# Patient Record
Sex: Female | Born: 1945
Health system: Southern US, Community
[De-identification: ages and names within clinical notes are randomized; demographics above are authoritative.]

## PROBLEM LIST (undated history)

## (undated) DIAGNOSIS — J40 Bronchitis, not specified as acute or chronic: Secondary | ICD-10-CM

## (undated) DIAGNOSIS — N816 Rectocele: Secondary | ICD-10-CM

## (undated) DIAGNOSIS — R112 Nausea with vomiting, unspecified: Secondary | ICD-10-CM

## (undated) DIAGNOSIS — R51 Headache: Secondary | ICD-10-CM

## (undated) DIAGNOSIS — M810 Age-related osteoporosis without current pathological fracture: Secondary | ICD-10-CM

## (undated) DIAGNOSIS — J449 Chronic obstructive pulmonary disease, unspecified: Secondary | ICD-10-CM

## (undated) DIAGNOSIS — J301 Allergic rhinitis due to pollen: Secondary | ICD-10-CM

## (undated) DIAGNOSIS — IMO0002 Reserved for concepts with insufficient information to code with codable children: Secondary | ICD-10-CM

## (undated) DIAGNOSIS — E669 Obesity, unspecified: Secondary | ICD-10-CM

## (undated) DIAGNOSIS — J1282 Pneumonia due to coronavirus disease 2019: Secondary | ICD-10-CM

## (undated) DIAGNOSIS — L719 Rosacea, unspecified: Secondary | ICD-10-CM

## (undated) DIAGNOSIS — Z9889 Other specified postprocedural states: Secondary | ICD-10-CM

## (undated) DIAGNOSIS — U071 COVID-19: Secondary | ICD-10-CM

## (undated) HISTORY — DX: Headache: R51

## (undated) HISTORY — PX: CHOLECYSTECTOMY: SHX55

## (undated) HISTORY — DX: Chronic obstructive pulmonary disease, unspecified: J44.9

## (undated) HISTORY — DX: Pneumonia due to coronavirus disease 2019: J12.82

## (undated) HISTORY — PX: TUBAL LIGATION: SHX77

## (undated) HISTORY — DX: Rectocele: N81.6

## (undated) HISTORY — DX: Reserved for concepts with insufficient information to code with codable children: IMO0002

## (undated) HISTORY — DX: COVID-19: U07.1

## (undated) HISTORY — DX: Allergic rhinitis due to pollen: J30.1

## (undated) HISTORY — DX: Age-related osteoporosis without current pathological fracture: M81.0

## (undated) HISTORY — DX: Bronchitis, not specified as acute or chronic: J40

## (undated) HISTORY — DX: Rosacea, unspecified: L71.9

## (undated) HISTORY — DX: Nausea with vomiting, unspecified: R11.2

## (undated) HISTORY — DX: Obesity, unspecified: E66.9

---

## 2001-05-07 ENCOUNTER — Encounter: Payer: Self-pay | Admitting: Obstetrics and Gynecology

## 2001-05-07 ENCOUNTER — Ambulatory Visit (HOSPITAL_COMMUNITY): Admission: RE | Admit: 2001-05-07 | Discharge: 2001-05-07 | Payer: Self-pay | Admitting: Obstetrics and Gynecology

## 2001-05-07 ENCOUNTER — Other Ambulatory Visit: Admission: RE | Admit: 2001-05-07 | Discharge: 2001-05-07 | Payer: Self-pay | Admitting: Obstetrics and Gynecology

## 2001-12-13 ENCOUNTER — Ambulatory Visit (HOSPITAL_COMMUNITY): Admission: RE | Admit: 2001-12-13 | Discharge: 2001-12-13 | Payer: Self-pay | Admitting: Pulmonary Disease

## 2001-12-14 ENCOUNTER — Ambulatory Visit (HOSPITAL_COMMUNITY): Admission: RE | Admit: 2001-12-14 | Discharge: 2001-12-14 | Payer: Self-pay | Admitting: General Surgery

## 2001-12-14 ENCOUNTER — Encounter: Payer: Self-pay | Admitting: General Surgery

## 2002-05-10 ENCOUNTER — Ambulatory Visit (HOSPITAL_COMMUNITY): Admission: RE | Admit: 2002-05-10 | Discharge: 2002-05-10 | Payer: Self-pay | Admitting: Obstetrics and Gynecology

## 2002-05-10 ENCOUNTER — Encounter: Payer: Self-pay | Admitting: Obstetrics and Gynecology

## 2003-01-16 ENCOUNTER — Other Ambulatory Visit: Admission: RE | Admit: 2003-01-16 | Discharge: 2003-01-16 | Payer: Self-pay | Admitting: Dental General Practice

## 2003-05-30 ENCOUNTER — Ambulatory Visit (HOSPITAL_COMMUNITY): Admission: RE | Admit: 2003-05-30 | Discharge: 2003-05-30 | Payer: Self-pay | Admitting: Obstetrics and Gynecology

## 2004-06-07 ENCOUNTER — Ambulatory Visit (HOSPITAL_COMMUNITY): Admission: RE | Admit: 2004-06-07 | Discharge: 2004-06-07 | Payer: Self-pay | Admitting: Obstetrics and Gynecology

## 2005-06-24 ENCOUNTER — Ambulatory Visit (HOSPITAL_COMMUNITY): Admission: RE | Admit: 2005-06-24 | Discharge: 2005-06-24 | Payer: Self-pay | Admitting: Obstetrics and Gynecology

## 2006-06-29 ENCOUNTER — Ambulatory Visit (HOSPITAL_COMMUNITY): Admission: RE | Admit: 2006-06-29 | Discharge: 2006-06-29 | Payer: Self-pay | Admitting: Obstetrics and Gynecology

## 2007-07-06 ENCOUNTER — Other Ambulatory Visit: Admission: RE | Admit: 2007-07-06 | Discharge: 2007-07-06 | Payer: Self-pay | Admitting: Obstetrics and Gynecology

## 2007-07-06 ENCOUNTER — Ambulatory Visit (HOSPITAL_COMMUNITY): Admission: RE | Admit: 2007-07-06 | Discharge: 2007-07-06 | Payer: Self-pay | Admitting: Obstetrics and Gynecology

## 2008-08-01 ENCOUNTER — Ambulatory Visit (HOSPITAL_COMMUNITY): Admission: RE | Admit: 2008-08-01 | Discharge: 2008-08-01 | Payer: Self-pay | Admitting: Obstetrics and Gynecology

## 2008-08-29 ENCOUNTER — Other Ambulatory Visit: Admission: RE | Admit: 2008-08-29 | Discharge: 2008-08-29 | Payer: Self-pay | Admitting: Obstetrics and Gynecology

## 2009-08-14 ENCOUNTER — Ambulatory Visit (HOSPITAL_COMMUNITY): Admission: RE | Admit: 2009-08-14 | Discharge: 2009-08-14 | Payer: Self-pay | Admitting: Pulmonary Disease

## 2009-09-11 ENCOUNTER — Other Ambulatory Visit: Admission: RE | Admit: 2009-09-11 | Discharge: 2009-09-11 | Payer: Self-pay | Admitting: Obstetrics and Gynecology

## 2010-07-23 ENCOUNTER — Other Ambulatory Visit: Payer: Self-pay | Admitting: Obstetrics and Gynecology

## 2010-07-23 DIAGNOSIS — Z1239 Encounter for other screening for malignant neoplasm of breast: Secondary | ICD-10-CM

## 2010-07-24 ENCOUNTER — Encounter: Payer: Self-pay | Admitting: Obstetrics and Gynecology

## 2010-08-20 ENCOUNTER — Ambulatory Visit (HOSPITAL_COMMUNITY): Payer: BC Managed Care – PPO

## 2010-08-20 ENCOUNTER — Ambulatory Visit (HOSPITAL_COMMUNITY)
Admission: RE | Admit: 2010-08-20 | Discharge: 2010-08-20 | Disposition: A | Discharge status: Home / Self Care | Payer: BC Managed Care – PPO | Source: Ambulatory Visit | Attending: Obstetrics and Gynecology | Admitting: Obstetrics and Gynecology

## 2010-08-20 DIAGNOSIS — Z1231 Encounter for screening mammogram for malignant neoplasm of breast: Secondary | ICD-10-CM | POA: Insufficient documentation

## 2010-08-20 DIAGNOSIS — Z1239 Encounter for other screening for malignant neoplasm of breast: Secondary | ICD-10-CM

## 2010-09-17 ENCOUNTER — Other Ambulatory Visit: Payer: Self-pay | Admitting: Adult Health

## 2010-09-17 ENCOUNTER — Other Ambulatory Visit (HOSPITAL_COMMUNITY)
Admission: RE | Admit: 2010-09-17 | Discharge: 2010-09-17 | Disposition: A | Discharge status: Home / Self Care | Payer: BC Managed Care – PPO | Source: Ambulatory Visit | Attending: Obstetrics and Gynecology | Admitting: Obstetrics and Gynecology

## 2010-09-17 DIAGNOSIS — Z01419 Encounter for gynecological examination (general) (routine) without abnormal findings: Secondary | ICD-10-CM | POA: Insufficient documentation

## 2010-11-19 NOTE — Op Note (Signed)
[facility]  Patient:    Jessica Lewis, Jessica Lewis Visit Number: [id number] MRN: [id number]          Service Type: OUT Location: RAD Attending Physician:  Fredirick Maudlin Dictated by:   Franky Macho, M.D. Proc. Date: 12/14/01 Admit Date:  12/13/2001 Discharge Date: 12/13/2001   CC:         Kari Baars, M.D.   Operative Report  PREOPERATIVE DIAGNOSES:  Cholecystitis, cholelithiasis.  POSTOPERATIVE DIAGNOSES:  Cholecystitis, cholelithiasis.  PROCEDURE:  Laparoscopic cholecystectomy.  SURGEON:  Franky Macho, M.D.  ASSISTANT:  Arna Snipe, M.D.  ANESTHESIA:  General endotracheal.  INDICATIONS FOR PROCEDURE:  The patient is a 65 year old white female who presents with acute cholecystitis secondary to cholelithiasis. The risks and benefits of the procedure including bleeding, infection, hepatobiliary injury, and the possibility of an open procedure were fully explained to the patient, who gave informed consent.  DESCRIPTION OF PROCEDURE:  The patient was placed in the supine position. After induction of general endotracheal anesthesia, the abdomen was prepped and draped using the usual sterile technique with Betadine.  A supraumbilical incision was made down to the fascia. A Veress needle was introduced into the abdominal cavity and confirmation of placement was done using the saline drop test. The abdomen was then insufflated to 16 mmHg pressure. An 11 mm trocar was introduced into the abdominal cavity under direct visualization without difficulty. The patient was placed in reverse Trendelenburg position and an additional 11 mm trocar was placed in the epigastric region and 5 mm trocars were placed in the right upper quadrant and right flank regions.  The liver was inspected and noted to be within normal limits. The gallbladder was noted to be distended, swollen, and edematous. Needle decompression of the gallbladder was performed. No purulent  drainage was noted. The gallbladder was retracted superior and laterally. The dissection was begun around the infundibulum of the gallbladder. The cystic duct was first identified. Its juncture to the infundibulum fully identified and the clips were placed proximally and distally on the cystic duct and the cystic duct was divided. This was likewise done in the cystic artery. The gallbladder was then freed away from the gallbladder fossa using Bovie electrocautery. The gallbladder was delivered through the epigastric trocar site using an endocatch bag. The gallbladder fossa was inspected and no abnormal bleeding or bile leakage was noted. Surgicel was placed in the gallbladder fossa. The subhepatic space as well as the right hepatic gutter were irrigated with normal saline. All fluid and air were then evacuated from the abdominal cavity prior to removal of the trocars.  All wounds were irrigated with normal saline. All wounds were injected with 0.5% Sensorcaine. The supraumbilical fascia was reapproximated using an #0 Vicryl interrupted suture. While closing the epigastric fascia with an #0 Vicryl suture, a portion of the needle broke off. This was retrieved in the soft tissue using C-arm fluoroscopy. The whole needle was retrieved. There was no remnant metallic body within the wound. All skin incisions were closed using staples. Betadine ointment and dry sterile dressings were applied.  All tape and needle counts were correct at the end of the procedure. The patient was extubated in the operating room and went back to the recovery room awake in stable condition.  SPECIMEN:  Gallbladder with stones.  ESTIMATED BLOOD LOSS:  Minimal. Dictated by:   Franky Macho, M.D. Attending Physician:  Fredirick Maudlin DD:  12/14/01 TD:  12/16/01 Job: [id number]

## 2011-01-20 ENCOUNTER — Encounter (INDEPENDENT_AMBULATORY_CARE_PROVIDER_SITE_OTHER): Payer: BC Managed Care – PPO | Admitting: Internal Medicine

## 2011-02-09 ENCOUNTER — Encounter (HOSPITAL_COMMUNITY): Payer: Self-pay | Admitting: *Deleted

## 2011-02-09 ENCOUNTER — Ambulatory Visit (HOSPITAL_COMMUNITY)
Admission: RE | Admit: 2011-02-09 | Discharge: 2011-02-09 | Disposition: A | Discharge status: Home / Self Care | Payer: BC Managed Care – PPO | Source: Ambulatory Visit | Attending: Internal Medicine | Admitting: Internal Medicine

## 2011-02-09 ENCOUNTER — Encounter (INDEPENDENT_AMBULATORY_CARE_PROVIDER_SITE_OTHER): Payer: BC Managed Care – PPO | Admitting: Internal Medicine

## 2011-02-09 ENCOUNTER — Encounter (HOSPITAL_COMMUNITY)
Admission: RE | Disposition: A | Discharge status: Home / Self Care | Payer: Self-pay | Source: Ambulatory Visit | Attending: Internal Medicine

## 2011-02-09 DIAGNOSIS — K5730 Diverticulosis of large intestine without perforation or abscess without bleeding: Secondary | ICD-10-CM

## 2011-02-09 DIAGNOSIS — Z1211 Encounter for screening for malignant neoplasm of colon: Secondary | ICD-10-CM | POA: Insufficient documentation

## 2011-02-09 DIAGNOSIS — K644 Residual hemorrhoidal skin tags: Secondary | ICD-10-CM | POA: Insufficient documentation

## 2011-02-09 HISTORY — PX: COLONOSCOPY: SHX5424

## 2011-02-09 HISTORY — DX: Age-related osteoporosis without current pathological fracture: M81.0

## 2011-02-09 SURGERY — COLONOSCOPY
Anesthesia: Moderate Sedation

## 2011-02-09 MED ORDER — STERILE WATER FOR IRRIGATION IR SOLN
Status: DC | PRN
  Administered 2011-02-09: 13:00:00

## 2011-02-09 MED ORDER — MEPERIDINE HCL 50 MG/ML IJ SOLN
INTRAMUSCULAR | Status: DC | PRN
  Administered 2011-02-09 (×2): 25 mg via INTRAVENOUS

## 2011-02-09 MED ORDER — MIDAZOLAM HCL 5 MG/5ML IJ SOLN
INTRAMUSCULAR | Status: AC
  Filled 2011-02-09: qty 10

## 2011-02-09 MED ORDER — MIDAZOLAM HCL 5 MG/5ML IJ SOLN
INTRAMUSCULAR | Status: DC | PRN
  Administered 2011-02-09 (×3): 2 mg via INTRAVENOUS

## 2011-02-09 MED ORDER — SODIUM CHLORIDE 0.45 % IV SOLN
Freq: Once | INTRAVENOUS | Status: AC
  Administered 2011-02-09: 12:00:00 via INTRAVENOUS

## 2011-02-09 MED ORDER — MEPERIDINE HCL 50 MG/ML IJ SOLN
INTRAMUSCULAR | Status: AC
  Filled 2011-02-09: qty 1

## 2011-02-09 NOTE — H&P (Signed)
Jessica Lewis is an 65 y.o. female.   Chief Complaint: for screening colonoscopy. HPI: She is 65 year old female who is in for average risk screening colonoscopy; her last exam was 2000; she denies. abdominal pain change in her bowel habits melena or rectal bleeding Family history is negative for colorectal carcinoma  Past Medical History  Diagnosis Date  . Osteoporosis, unspecified     Past Surgical History  Procedure Date  . Cholecystectomy     History reviewed. No pertinent family history. Social History:  reports that she has never smoked. She does not have any smokeless tobacco history on file. She reports that she does not drink alcohol or use illicit drugs.  Allergies:  Allergies  Allergen Reactions  . Codeine Nausea And Vomiting  . Tetracyclines & Related Rash    Medications Prior to Admission  Medication Dose Route Frequency Provider Last Rate Last Dose  . 0.45 % sodium chloride infusion   Intravenous Once Malissa Hippo, MD 20 mL/hr at 02/09/11 1223    . meperidine (DEMEROL) 50 MG/ML injection           . midazolam (VERSED) 5 MG/5ML injection            Medications Prior to Admission  Medication Sig Dispense Refill  . calcium carbonate 200 MG capsule Take 1,000 mg by mouth 2 (two) times daily with a meal.        . multivitamin (THERAGRAN) per tablet Take 1 tablet by mouth daily.          No results found for this or any previous visit (from the past 48 hour(s)). No results found.  Review of Systems  Constitutional: Positive for weight loss (15 lb voluntary weight loss in 3 months.).  Gastrointestinal: Negative for abdominal pain, diarrhea, constipation, blood in stool and melena.    Blood pressure 166/73, pulse 110, temperature 98 F (36.7 C), temperature source Oral, resp. rate 18, height 5\' 4"  (1.626 m), weight 214 lb (97.07 kg), SpO2 94.00%. Physical Exam  Constitutional: She appears well-developed and well-nourished.  HENT:  Mouth/Throat: Oropharynx is  clear and moist.  Eyes: Conjunctivae are normal. No scleral icterus.  Neck: No thyromegaly present.  Cardiovascular: Normal rate, regular rhythm and normal heart sounds.   No murmur heard. Respiratory: Breath sounds normal.  GI: Soft. She exhibits no distension and no mass. There is no tenderness.  Musculoskeletal: She exhibits no edema.  Lymphadenopathy:    She has no cervical adenopathy.  Neurological: She is alert.  Skin: Skin is warm and dry.     Assessment/Plan Average risk screening colonoscopy. Procedure and risks  reviewed with him and she is agreeable,  Davide Risdon U 02/09/2011, 1:04 PM

## 2011-02-09 NOTE — Op Note (Signed)
COLONOSCOPY PROCEDURE REPORT  PATIENT:  Jessica Lewis  MR#:  [id number] Birthdate:  April 15, 1946, 65 y.o., female Endoscopist:  Dr. Malissa Hippo, MD Referred By:  Dr. Fredirick Maudlin. Procedure Date: 02/09/2011  Procedure:   Colonoscopy  Indications:  Average risk screening colonoscopy.  Informed Consent:  Procedure and risks were reviewed with the patient and informed consent was obtained Medications:  Demerol 50 mg IV Versed 6 mg IV  Description of procedure:  After a digital rectal exam was performed, that colonoscope was advanced from the anus through the rectum and colon to the area of the cecum, ileocecal valve and appendiceal orifice. The cecum was deeply intubated. These structures were well-seen and photographed for the record. From the level of the cecum and ileocecal valve, the scope was slowly and cautiously withdrawn. The mucosal surfaces were carefully surveyed utilizing scope tip to flexion to facilitate fold flattening as needed. The scope was pulled down into the rectum where a thorough exam including retroflexion was performed. Terminal ileum was also examined.  Findings:   Prep excellent. Moderate number of diverticula at the sigmoid colon. Small external hemorrhoids.   Therapeutic/Diagnostic Maneuvers Performed:  None  Complications: None  Cecal Withdrawal Time:  10 minutes  Impression:  Normal terminal ileum. Moderate  sgmoid diverticulosis. Small external hemorrhoids.  Recommendations:  Resume usual medications. High fiber diet. Next screening exam in 10 years.  [name] U  02/09/2011 1:34 PM  CC: Dr. Fredirick Maudlin, MD

## 2011-02-16 ENCOUNTER — Encounter (HOSPITAL_COMMUNITY): Payer: Self-pay | Admitting: Internal Medicine

## 2011-03-02 ENCOUNTER — Encounter (INDEPENDENT_AMBULATORY_CARE_PROVIDER_SITE_OTHER): Payer: BC Managed Care – PPO | Admitting: Internal Medicine

## 2011-07-11 ENCOUNTER — Other Ambulatory Visit: Payer: Self-pay | Admitting: Obstetrics and Gynecology

## 2011-07-11 DIAGNOSIS — Z139 Encounter for screening, unspecified: Secondary | ICD-10-CM

## 2011-07-26 ENCOUNTER — Ambulatory Visit (HOSPITAL_COMMUNITY): Payer: BC Managed Care – PPO

## 2011-08-26 ENCOUNTER — Ambulatory Visit (HOSPITAL_COMMUNITY): Payer: BC Managed Care – PPO

## 2011-09-02 ENCOUNTER — Ambulatory Visit (HOSPITAL_COMMUNITY)
Admission: RE | Admit: 2011-09-02 | Discharge: 2011-09-02 | Disposition: A | Discharge status: Home / Self Care | Payer: BC Managed Care – PPO | Source: Ambulatory Visit | Attending: Obstetrics and Gynecology | Admitting: Obstetrics and Gynecology

## 2011-09-02 DIAGNOSIS — Z1231 Encounter for screening mammogram for malignant neoplasm of breast: Secondary | ICD-10-CM | POA: Insufficient documentation

## 2011-09-02 DIAGNOSIS — Z139 Encounter for screening, unspecified: Secondary | ICD-10-CM

## 2012-08-21 ENCOUNTER — Other Ambulatory Visit (HOSPITAL_COMMUNITY): Payer: Self-pay | Admitting: Adult Health

## 2012-08-21 DIAGNOSIS — Z139 Encounter for screening, unspecified: Secondary | ICD-10-CM

## 2012-09-04 ENCOUNTER — Ambulatory Visit (HOSPITAL_COMMUNITY): Payer: BC Managed Care – PPO

## 2012-09-06 ENCOUNTER — Ambulatory Visit (HOSPITAL_COMMUNITY)
Admission: RE | Admit: 2012-09-06 | Discharge: 2012-09-06 | Disposition: A | Discharge status: Home / Self Care | Payer: Medicare Other | Source: Ambulatory Visit | Attending: Adult Health | Admitting: Adult Health

## 2012-09-06 DIAGNOSIS — Z139 Encounter for screening, unspecified: Secondary | ICD-10-CM

## 2012-09-06 DIAGNOSIS — Z1231 Encounter for screening mammogram for malignant neoplasm of breast: Secondary | ICD-10-CM | POA: Insufficient documentation

## 2012-10-30 ENCOUNTER — Encounter (INDEPENDENT_AMBULATORY_CARE_PROVIDER_SITE_OTHER): Payer: Self-pay

## 2012-11-15 ENCOUNTER — Encounter: Payer: Self-pay | Admitting: *Deleted

## 2012-11-15 DIAGNOSIS — G43909 Migraine, unspecified, not intractable, without status migrainosus: Secondary | ICD-10-CM

## 2012-11-16 ENCOUNTER — Ambulatory Visit (INDEPENDENT_AMBULATORY_CARE_PROVIDER_SITE_OTHER): Payer: Medicare Other | Admitting: Adult Health

## 2012-11-16 ENCOUNTER — Encounter: Payer: Self-pay | Admitting: Adult Health

## 2012-11-16 VITALS — BP 172/84 | Ht 63.0 in | Wt 226.0 lb

## 2012-11-16 DIAGNOSIS — N816 Rectocele: Secondary | ICD-10-CM

## 2012-11-16 DIAGNOSIS — M8580 Other specified disorders of bone density and structure, unspecified site: Secondary | ICD-10-CM

## 2012-11-16 DIAGNOSIS — Z01419 Encounter for gynecological examination (general) (routine) without abnormal findings: Secondary | ICD-10-CM

## 2012-11-16 DIAGNOSIS — Z1212 Encounter for screening for malignant neoplasm of rectum: Secondary | ICD-10-CM

## 2012-11-16 HISTORY — DX: Rectocele: N81.6

## 2012-11-16 LAB — HEMOCCULT GUIAC POC 1CARD (OFFICE): Fecal Occult Blood, POC: NEGATIVE

## 2012-11-16 NOTE — Patient Instructions (Addendum)
BP 172/84 recheck 150/80 get daughter to check Physical and pap  1 year Sign up for my chart

## 2012-11-16 NOTE — Progress Notes (Signed)
Patient ID: Jessica Lewis, female   DOB: [birth year], 67 y.o.   MRN: [id number] History of Present Illness: Jessica Lewis is a 67 year old white female in for a physical. She is retired now and loves it.   Current Medications, Allergies, Past Medical History, Past Surgical History, Family History and Social History were reviewed in [employer] record.    Review of Systems: Patient denies any headaches, blurred vision, shortness of breath, chest pain, abdominal pain, problems with bowel movements, urination. She is not having sex, no mood changes, except she was down at Mothers Day she misses her MOM. She is having pain in her feet and going to see Dr. Nolen Mu.  Physical Exam:Blood pressure 172/84, height 5\' 3"  (1.6 m), weight 226 lb (102.513 kg). General:  Well developed, well nourished, no acute distress Skin:  Warm and dry Neck:  Midline trachea, normal thyroid, no carotid bruits heard Lungs; Clear to auscultation bilaterally Breast:  No dominant palpable mass, retraction, or nipple discharge Cardiovascular: Regular rate and rhythm Abdomen:  Soft, non tender, no hepatosplenomegaly Pelvic:  External genitalia is normal in appearance for her age..  The vagina is atrophic in appearance.     The cervix is atrophic.  Uterus is felt to be normal size, shape, and contour.  No adnexal masses or tenderness noted. Rectal: Good sphincter tone, no polyps, or hemorrhoids felt.  Hemoccult negative. She has a rectocele Extremities:  No swelling  noted Psych:  Alert and cooperative, and happy  Impression: Yearly GYN exam, no pap Osteopenia Rectocele  Plan: Return in 1 year for pap and physical Mammogram yearly Colonoscopy 2022 Labs at PCP

## 2013-08-05 ENCOUNTER — Other Ambulatory Visit: Payer: Self-pay | Admitting: Obstetrics and Gynecology

## 2013-08-05 DIAGNOSIS — Z139 Encounter for screening, unspecified: Secondary | ICD-10-CM

## 2013-09-10 ENCOUNTER — Ambulatory Visit (HOSPITAL_COMMUNITY)
Admission: RE | Admit: 2013-09-10 | Discharge: 2013-09-10 | Disposition: A | Discharge status: Home / Self Care | Payer: Medicare Other | Source: Ambulatory Visit | Attending: Obstetrics and Gynecology | Admitting: Obstetrics and Gynecology

## 2013-09-10 DIAGNOSIS — Z1231 Encounter for screening mammogram for malignant neoplasm of breast: Secondary | ICD-10-CM | POA: Insufficient documentation

## 2013-09-10 DIAGNOSIS — Z139 Encounter for screening, unspecified: Secondary | ICD-10-CM

## 2013-10-08 ENCOUNTER — Ambulatory Visit (INDEPENDENT_AMBULATORY_CARE_PROVIDER_SITE_OTHER): Payer: Medicare Other | Admitting: Adult Health

## 2013-10-08 ENCOUNTER — Encounter: Payer: Self-pay | Admitting: Adult Health

## 2013-10-08 ENCOUNTER — Other Ambulatory Visit (HOSPITAL_COMMUNITY)
Admission: RE | Admit: 2013-10-08 | Discharge: 2013-10-08 | Disposition: A | Discharge status: Home / Self Care | Payer: Medicare Other | Source: Ambulatory Visit | Attending: Adult Health | Admitting: Adult Health

## 2013-10-08 VITALS — BP 130/72 | HR 76 | Ht 64.0 in | Wt 209.0 lb

## 2013-10-08 DIAGNOSIS — Z1212 Encounter for screening for malignant neoplasm of rectum: Secondary | ICD-10-CM

## 2013-10-08 DIAGNOSIS — Z01419 Encounter for gynecological examination (general) (routine) without abnormal findings: Secondary | ICD-10-CM

## 2013-10-08 DIAGNOSIS — Z1151 Encounter for screening for human papillomavirus (HPV): Secondary | ICD-10-CM | POA: Insufficient documentation

## 2013-10-08 LAB — HEMOCCULT GUIAC POC 1CARD (OFFICE): FECAL OCCULT BLD: NEGATIVE

## 2013-10-08 NOTE — Progress Notes (Signed)
Patient ID: Jessica Lewis, female   DOB: May 08, 1946, 68 y.o.   MRN: [id number] History of Present Illness: Jessica Lewis is a 67 year old white female, married and retired in for a pap and physical.No complaints. Husband had triple by pass in November 2014 and is doing well.Had mammogram in March. Got pneumonia vac and shingles shot 2 years age before retiring.  Current Medications, Allergies, Past Medical History, Past Surgical History, Family History and Social History were reviewed in [employer] record.     Review of Systems: Patient denies any headaches, blurred vision, shortness of breath, chest pain, abdominal pain, problems with bowel movements, urination, or intercourse. Not having sex at present, no joint swelling, mood swings.    Physical Exam:BP 130/72  Pulse 76  Ht 5\' 4"  (1.626 m)  Wt 209 lb (94.802 kg)  BMI 35.86 kg/m2 General:  Well developed, well nourished, no acute distress Skin:  Warm and dry Neck:  Midline trachea, normal thyroid Lungs; Clear to auscultation bilaterally Breast:  No dominant palpable mass, retraction, or nipple discharge Cardiovascular: Regular rate and rhythm Abdomen:  Soft, non tender, no hepatosplenomegaly Pelvic:  External genitalia is normal in appearance.  The vagina is normal in appearance for her age.     The cervix is atrophic.Pap with HPV performed.  Uterus is felt to be normal size, shape, and contour.  No adnexal masses or tenderness noted. Rectal: Good sphincter tone, no polyps, or hemorrhoids felt.  Hemoccult negative.Has rectocele. Extremities:  No swelling  noted Psych:  No mood changes, alert and cooperative, seems happy   Impression: Yearly gyn well woman exam    Plan: Physical in 2 years Mammogram yearly  Colonoscopy per GI Labs with PCP

## 2013-10-08 NOTE — Patient Instructions (Signed)
Physical in 2 years Mammogram yearly  Labs with PCP Colonoscopy as GI

## 2014-02-14 ENCOUNTER — Encounter: Payer: Self-pay | Admitting: Family Medicine

## 2014-05-05 ENCOUNTER — Encounter: Payer: Self-pay | Admitting: Adult Health

## 2014-07-22 DIAGNOSIS — L851 Acquired keratosis [keratoderma] palmaris et plantaris: Secondary | ICD-10-CM | POA: Diagnosis not present

## 2014-07-22 DIAGNOSIS — M2010 Hallux valgus (acquired), unspecified foot: Secondary | ICD-10-CM | POA: Diagnosis not present

## 2014-08-12 ENCOUNTER — Other Ambulatory Visit: Payer: Self-pay | Admitting: Obstetrics and Gynecology

## 2014-08-12 DIAGNOSIS — Z1231 Encounter for screening mammogram for malignant neoplasm of breast: Secondary | ICD-10-CM

## 2014-09-17 ENCOUNTER — Ambulatory Visit (HOSPITAL_COMMUNITY)
Admission: RE | Admit: 2014-09-17 | Discharge: 2014-09-17 | Disposition: A | Discharge status: Home / Self Care | Payer: Medicare Other | Source: Ambulatory Visit | Attending: Obstetrics and Gynecology | Admitting: Obstetrics and Gynecology

## 2014-09-17 DIAGNOSIS — Z1231 Encounter for screening mammogram for malignant neoplasm of breast: Secondary | ICD-10-CM | POA: Diagnosis not present

## 2014-11-05 DIAGNOSIS — H40053 Ocular hypertension, bilateral: Secondary | ICD-10-CM | POA: Diagnosis not present

## 2015-04-06 LAB — HM COLONOSCOPY

## 2015-04-15 ENCOUNTER — Telehealth: Payer: Self-pay | Admitting: *Deleted

## 2015-04-15 DIAGNOSIS — Z23 Encounter for immunization: Secondary | ICD-10-CM | POA: Diagnosis not present

## 2015-04-15 DIAGNOSIS — Z0000 Encounter for general adult medical examination without abnormal findings: Secondary | ICD-10-CM | POA: Diagnosis not present

## 2015-04-15 NOTE — Telephone Encounter (Signed)
Spoke with pt. Pt was wondering when she had a bone density test. I looked in her chart but couldn't find one. Pt to call Dr. Luan Pulling and see if he was the one that ordered it and if he didn't, I advised to call the hospital to see if they can help her. Pt voiced understanding. [place]

## 2015-04-16 ENCOUNTER — Encounter: Payer: Self-pay | Admitting: Family Medicine

## 2015-04-16 DIAGNOSIS — Z0000 Encounter for general adult medical examination without abnormal findings: Secondary | ICD-10-CM | POA: Diagnosis not present

## 2015-04-16 DIAGNOSIS — E785 Hyperlipidemia, unspecified: Secondary | ICD-10-CM | POA: Diagnosis not present

## 2015-05-20 DIAGNOSIS — Z1211 Encounter for screening for malignant neoplasm of colon: Secondary | ICD-10-CM | POA: Diagnosis not present

## 2015-08-10 ENCOUNTER — Other Ambulatory Visit: Payer: Self-pay | Admitting: Obstetrics and Gynecology

## 2015-08-10 DIAGNOSIS — Z1231 Encounter for screening mammogram for malignant neoplasm of breast: Secondary | ICD-10-CM

## 2015-09-21 ENCOUNTER — Ambulatory Visit (HOSPITAL_COMMUNITY)
Admission: RE | Admit: 2015-09-21 | Discharge: 2015-09-21 | Disposition: A | Discharge status: Home / Self Care | Payer: Medicare Other | Source: Ambulatory Visit | Attending: Obstetrics and Gynecology | Admitting: Obstetrics and Gynecology

## 2015-09-21 DIAGNOSIS — Z1231 Encounter for screening mammogram for malignant neoplasm of breast: Secondary | ICD-10-CM | POA: Diagnosis not present

## 2015-10-05 ENCOUNTER — Other Ambulatory Visit (HOSPITAL_COMMUNITY): Payer: Self-pay | Admitting: Pulmonary Disease

## 2015-10-05 ENCOUNTER — Ambulatory Visit (HOSPITAL_COMMUNITY)
Admission: RE | Admit: 2015-10-05 | Discharge: 2015-10-05 | Disposition: A | Discharge status: Home / Self Care | Payer: Medicare Other | Source: Ambulatory Visit | Attending: Pulmonary Disease | Admitting: Pulmonary Disease

## 2015-10-05 DIAGNOSIS — R05 Cough: Secondary | ICD-10-CM | POA: Insufficient documentation

## 2015-10-05 DIAGNOSIS — J209 Acute bronchitis, unspecified: Secondary | ICD-10-CM | POA: Diagnosis not present

## 2015-10-05 DIAGNOSIS — R21 Rash and other nonspecific skin eruption: Secondary | ICD-10-CM | POA: Diagnosis not present

## 2015-10-05 DIAGNOSIS — R0989 Other specified symptoms and signs involving the circulatory and respiratory systems: Secondary | ICD-10-CM | POA: Insufficient documentation

## 2015-10-05 DIAGNOSIS — R059 Cough, unspecified: Secondary | ICD-10-CM

## 2015-10-05 DIAGNOSIS — J019 Acute sinusitis, unspecified: Secondary | ICD-10-CM | POA: Diagnosis not present

## 2015-10-14 ENCOUNTER — Encounter: Payer: Self-pay | Admitting: Adult Health

## 2015-10-14 ENCOUNTER — Ambulatory Visit (INDEPENDENT_AMBULATORY_CARE_PROVIDER_SITE_OTHER): Payer: Medicare Other | Admitting: Adult Health

## 2015-10-14 VITALS — BP 130/70 | HR 72 | Ht 64.0 in | Wt 213.0 lb

## 2015-10-14 DIAGNOSIS — Z1212 Encounter for screening for malignant neoplasm of rectum: Secondary | ICD-10-CM

## 2015-10-14 DIAGNOSIS — Z01419 Encounter for gynecological examination (general) (routine) without abnormal findings: Secondary | ICD-10-CM

## 2015-10-14 DIAGNOSIS — N816 Rectocele: Secondary | ICD-10-CM

## 2015-10-14 DIAGNOSIS — IMO0002 Reserved for concepts with insufficient information to code with codable children: Secondary | ICD-10-CM

## 2015-10-14 HISTORY — DX: Reserved for concepts with insufficient information to code with codable children: IMO0002

## 2015-10-14 LAB — HEMOCCULT GUIAC POC 1CARD (OFFICE): Fecal Occult Blood, POC: NEGATIVE

## 2015-10-14 NOTE — Progress Notes (Signed)
Patient ID: Jessica Lewis, female   DOB: January 17, 1946, 70 y.o.   MRN: [id number] History of Present Illness:  Jessica Lewis is a 70 year old white female, married in for a well woman gyn exam, she had a normal pap with negative HPV 10/08/13.She has bronchitis and has been on antibiotic but still has a cough. PCP is Dr Luan Pulling.  Current Medications, Allergies, Past Medical History, Past Surgical History, Family History and Social History were reviewed in [employer] record.     Review of Systems: Patient denies any headaches, hearing loss, fatigue, blurred vision, shortness of breath, chest pain, abdominal pain, problems with bowel movements, urination, or intercourse(not having sex). No joint pain or mood swings.She denies any loss of urine at present.    Physical Exam:BP 130/70 mmHg  Pulse 72  Ht 5\' 4"  (1.626 m)  Wt 213 lb (96.616 kg)  BMI 36.54 kg/m2 General:  Well developed, well nourished, no acute distress Skin:  Warm and dry Neck:  Midline trachea, normal thyroid, good ROM, no lymphadenopathy, no carotid bruits heard Lungs; Clear to auscultation bilaterally after cough,  Breast:  No dominant palpable mass, retraction, or nipple discharge Cardiovascular: Regular rate and rhythm Abdomen:  Soft, non tender, no hepatosplenomegaly Pelvic:  External genitalia is normal in appearance, no lesions.  The vagina is normal in appearance. Urethra has no lesions or masses. The cervix is smooth.She has a cystocele, that descends to introitus.  Uterus is felt to be normal size, shape, and contour.  No adnexal masses or tenderness noted.Bladder is non tender, no masses felt. Rectal: Good sphincter tone, no polyps, or hemorrhoids felt.  Hemoccult negative.+ mild rectocele Extremities/musculoskeletal:  No swelling or varicosities noted, no clubbing or cyanosis Psych:  No mood changes, alert and cooperative,seems happy Discussed cystocele, and can try pessary for  support.  Impression: Well woman gyn exam no pap Cystocele Rectocele     Plan: Return in 1 week for pessary fitting Pap and physical in 2 years and if that one is normal can stop paps Mammogram yearly Labs with PCP Review handout on cystcele

## 2015-10-14 NOTE — Patient Instructions (Signed)
Return in 1 week for pessary fitting Mammogram yearly Pap and physical in 2 year

## 2015-10-23 ENCOUNTER — Ambulatory Visit (INDEPENDENT_AMBULATORY_CARE_PROVIDER_SITE_OTHER): Payer: Medicare Other | Admitting: Obstetrics and Gynecology

## 2015-10-23 ENCOUNTER — Encounter: Payer: Self-pay | Admitting: Obstetrics and Gynecology

## 2015-10-23 VITALS — BP 172/70 | HR 84 | Wt 213.0 lb

## 2015-10-23 DIAGNOSIS — N8110 Cystocele, unspecified: Secondary | ICD-10-CM

## 2015-10-23 DIAGNOSIS — N812 Incomplete uterovaginal prolapse: Secondary | ICD-10-CM | POA: Diagnosis not present

## 2015-10-23 DIAGNOSIS — N8111 Cystocele, midline: Secondary | ICD-10-CM | POA: Diagnosis not present

## 2015-10-23 NOTE — Progress Notes (Signed)
[facility] Visit  10/23/2015            Patient name: Jessica Lewis MRN [id number]  Date of birth: 1945/07/14  CC & HPI:  Jessica Lewis is a 70 y.o. female presenting today for a pessary fitting. Pt was seen and informed that her bladder was visible and was recommended to have a pessary placed. Pt reports that she feel more full in the area and with discomfort while sitting. Pt denies any pain to the area. Pt denies constipation, stress incontinence, and any other symptoms.   ROS:  ROS  -Constipation -Stress incontinence  Pertinent History Reviewed:   Reviewed: Significant for  Medical         Past Medical History  Diagnosis Date  . Osteoporosis, unspecified   . Headache(784.0)     menstral migraines  . Obesity   . Rosacea   . Rectocele 11/16/2012  . Cystocele 10/14/2015  . Bronchitis                               Surgical Hx:    Past Surgical History  Procedure Laterality Date  . Cholecystectomy    . Colonoscopy  02/09/2011    Procedure: COLONOSCOPY;  Surgeon: Rogene Houston, MD;  Location: [facility];  Service: Endoscopy;  Laterality: N/A;  . Tubal ligation Bilateral    Medications: Reviewed & Updated - see associated section                       Current outpatient prescriptions:  .  calcium carbonate 200 MG capsule, Take 1,000 mg by mouth daily. , Disp: , Rfl:  .  cholecalciferol (VITAMIN D) 1000 UNITS tablet, Take 1,000 Units by mouth daily., Disp: , Rfl:  .  denosumab (PROLIA) 60 MG/ML SOLN injection, Inject 60 mg into the skin every 6 (six) months. Administer in upper arm, thigh, or abdomen, Disp: , Rfl:  .  Multiple Vitamins-Minerals (ONE-A-DAY WOMENS PETITES PO), Take by mouth daily., Disp: , Rfl:  .  Omega-3 Fatty Acids (FISH OIL) 1000 MG CAPS, Take by mouth daily., Disp: , Rfl:  .  vitamin B-12 (CYANOCOBALAMIN) 1000 MCG tablet, Take 1,000 mcg by mouth daily., Disp: , Rfl:    Social History: Reviewed -  reports that she has quit smoking. Her  smoking use included Cigarettes. She has never used smokeless tobacco.  Objective Findings:  Vitals: Blood pressure 172/70, pulse 84, weight 213 lb (96.616 kg).  Physical Examination: General appearance - alert, well appearing, and in no distress and oriented to person, place, and time Mental status - alert, oriented to person, place, and time, normal mood, behavior, speech, dress, motor activity, and thought processes Pelvic - UTERUS: uterus is normal size, shape, consistency and nontender 1st degree uterine decensus with small cystocele, no protrusion, pt has no significant rectocele. .   Discussed with pt risks and benefits of weight loss. At end of discussion, pt had opportunity to ask questions and has no further questions at this time.   Greater than 50% was spent in counseling and coordination of care with the patient. Face-to-face time greater than: 25 minutes=total time 50 minutes   Assessment & Plan:   A:  1. Small cystocele not a clinical concern at present  P:  1. Weight loss, continue current exercise regimen 2. No pessary or surgery recommended   I personally performed the services  described in this documentation, which was SCRIBED in my presence. The recorded information has been reviewed and considered accurate. It has been edited as necessary during review. Jonnie Kind, MD   I personally performed the services described in this documentation, which was SCRIBED in my presence. The recorded information has been reviewed and considered accurate. It has been edited as necessary during review. Jonnie Kind, MD    By signing my name below, I, Soijett Blue, attest that this documentation has been prepared under the direction and in the presence of Jonnie Kind, MD. Electronically Signed: Soijett Blue, ED Scribe. 10/23/2015. 12:23 PM.

## 2016-01-19 DIAGNOSIS — H40053 Ocular hypertension, bilateral: Secondary | ICD-10-CM | POA: Diagnosis not present

## 2016-03-25 DIAGNOSIS — J209 Acute bronchitis, unspecified: Secondary | ICD-10-CM | POA: Diagnosis not present

## 2016-05-09 ENCOUNTER — Ambulatory Visit (HOSPITAL_COMMUNITY)
Admission: RE | Admit: 2016-05-09 | Discharge: 2016-05-09 | Disposition: A | Discharge status: Home / Self Care | Payer: Medicare Other | Source: Ambulatory Visit | Attending: Pulmonary Disease | Admitting: Pulmonary Disease

## 2016-05-09 ENCOUNTER — Other Ambulatory Visit (HOSPITAL_COMMUNITY): Payer: Self-pay | Admitting: Pulmonary Disease

## 2016-05-09 DIAGNOSIS — J449 Chronic obstructive pulmonary disease, unspecified: Secondary | ICD-10-CM | POA: Insufficient documentation

## 2016-05-09 DIAGNOSIS — I700 Atherosclerosis of aorta: Secondary | ICD-10-CM | POA: Insufficient documentation

## 2016-05-09 DIAGNOSIS — R059 Cough, unspecified: Secondary | ICD-10-CM

## 2016-05-09 DIAGNOSIS — R05 Cough: Secondary | ICD-10-CM | POA: Diagnosis not present

## 2016-05-19 LAB — VITAMIN D 25 HYDROXY (VIT D DEFICIENCY, FRACTURES): Vit D, 25-Hydroxy: 28.9

## 2016-05-19 LAB — COMPREHENSIVE METABOLIC PANEL
Albumin: 3.8 (ref 3.5–5.0)
Calcium: 9.2 (ref 8.7–10.7)
GFR calc Af Amer: 99
GFR calc non Af Amer: 86

## 2016-05-19 LAB — BASIC METABOLIC PANEL
BUN: 21 (ref 4–21)
CO2: 26 — AB (ref 13–22)
Chloride: 100 (ref 99–108)
Creatinine: 0.7 (ref <=1.1)
Glucose: 94
Potassium: 5.3 (ref 3.4–5.3)
Sodium: 141 (ref 137–147)

## 2016-05-19 LAB — CBC AND DIFFERENTIAL
HCT: 43 (ref 36–46)
Hemoglobin: 14.5 (ref 12.0–16.0)
Neutrophils Absolute: 6
Platelets: 339 (ref 150–399)
WBC: 9.8

## 2016-05-19 LAB — LIPID PANEL
Cholesterol: 216 — AB (ref 0–200)
HDL: 60 (ref 35–70)
LDL Cholesterol: 139
Triglycerides: 83 (ref 40–160)

## 2016-05-19 LAB — CBC: RBC: 4.78 (ref 3.87–5.11)

## 2016-05-19 LAB — HEPATIC FUNCTION PANEL
ALT: 11 (ref 7–35)
AST: 15 (ref 13–35)
Alkaline Phosphatase: 81 (ref 25–125)

## 2016-05-19 LAB — TSH: TSH: 2.09 (ref <=5.90)

## 2016-06-30 ENCOUNTER — Ambulatory Visit (HOSPITAL_COMMUNITY)
Admission: RE | Admit: 2016-06-30 | Discharge: 2016-06-30 | Disposition: A | Discharge status: Home / Self Care | Payer: Medicare Other | Source: Ambulatory Visit | Attending: Pulmonary Disease | Admitting: Pulmonary Disease

## 2016-06-30 ENCOUNTER — Other Ambulatory Visit (HOSPITAL_COMMUNITY): Payer: Self-pay | Admitting: Pulmonary Disease

## 2016-06-30 DIAGNOSIS — Z09 Encounter for follow-up examination after completed treatment for conditions other than malignant neoplasm: Secondary | ICD-10-CM

## 2016-06-30 DIAGNOSIS — M47814 Spondylosis without myelopathy or radiculopathy, thoracic region: Secondary | ICD-10-CM | POA: Diagnosis not present

## 2016-06-30 DIAGNOSIS — I700 Atherosclerosis of aorta: Secondary | ICD-10-CM | POA: Insufficient documentation

## 2016-06-30 DIAGNOSIS — Z8701 Personal history of pneumonia (recurrent): Secondary | ICD-10-CM | POA: Insufficient documentation

## 2016-06-30 DIAGNOSIS — J189 Pneumonia, unspecified organism: Secondary | ICD-10-CM

## 2016-10-17 ENCOUNTER — Encounter: Payer: Self-pay | Admitting: Adult Health

## 2016-10-17 ENCOUNTER — Other Ambulatory Visit (HOSPITAL_COMMUNITY)
Admission: RE | Admit: 2016-10-17 | Discharge: 2016-10-17 | Disposition: A | Discharge status: Home / Self Care | Payer: Medicare Other | Source: Ambulatory Visit | Attending: Adult Health | Admitting: Adult Health

## 2016-10-17 ENCOUNTER — Ambulatory Visit (INDEPENDENT_AMBULATORY_CARE_PROVIDER_SITE_OTHER): Payer: Medicare Other | Admitting: Adult Health

## 2016-10-17 VITALS — BP 138/74 | HR 82 | Ht 63.25 in | Wt 211.0 lb

## 2016-10-17 DIAGNOSIS — N8110 Cystocele, unspecified: Secondary | ICD-10-CM

## 2016-10-17 DIAGNOSIS — Z01419 Encounter for gynecological examination (general) (routine) without abnormal findings: Secondary | ICD-10-CM | POA: Diagnosis not present

## 2016-10-17 DIAGNOSIS — N816 Rectocele: Secondary | ICD-10-CM

## 2016-10-17 DIAGNOSIS — Z1211 Encounter for screening for malignant neoplasm of colon: Secondary | ICD-10-CM | POA: Diagnosis not present

## 2016-10-17 DIAGNOSIS — Z1212 Encounter for screening for malignant neoplasm of rectum: Secondary | ICD-10-CM | POA: Diagnosis not present

## 2016-10-17 LAB — HEMOCCULT GUIAC POC 1CARD (OFFICE): FECAL OCCULT BLD: NEGATIVE

## 2016-10-17 NOTE — Progress Notes (Signed)
Patient ID: Jessica Lewis, female   DOB: 10-29-1945, 71 y.o.   MRN: [id number] History of Present Illness:  Jessica Lewis is a 71 year old white female, married in for well woman gyn exam and pap. She is retired and loving it.She says bladder is down some more, but not a problem yet. She is going to [facility].   Current Medications, Allergies, Past Medical History, Past Surgical History, Family History and Social History were reviewed in [employer] record.     Review of Systems: Patient denies any headaches, hearing loss, fatigue, blurred vision, shortness of breath, chest pain, abdominal pain, problems with bowel movements, urination, or intercourse(not having sex). No joint pain or mood swings.Bladder down some more.    Physical Exam:BP 138/74 (BP Location: Right Arm, Patient Position: Sitting, Cuff Size: Normal)   Pulse 82   Ht 5' 3.25" (1.607 m)   Wt 211 lb (95.7 kg)   BMI 37.08 kg/m  General:  Well developed, well nourished, no acute distress Skin:  Warm and dry Neck:  Midline trachea, normal thyroid, good ROM, no lymphadenopathy Lungs; Clear to auscultation bilaterally Breast:  No dominant palpable mass, retraction, or nipple discharge Cardiovascular: Regular rate and rhythm Abdomen:  Soft, non tender, no hepatosplenomegaly Pelvic:  External genitalia is normal in appearance, no lesions.  The vagina is normal in appearance. Urethra has no lesions or masses.+cystocele. The cervix is bulbous, and smooth, pap performed with HVP. Uterus is felt to be normal size, shape, and contour.  No adnexal masses or tenderness noted.Bladder is non tender, no masses felt. Rectal: Good sphincter tone, no polyps, or hemorrhoids felt.  Hemoccult negative.+rectocele Extremities/musculoskeletal:  No swelling or varicosities noted, no clubbing or cyanosis Psych:  No mood changes, alert and cooperative,seems happy PHQ 2 score 0.  Impression: 1. Encounter for gynecological examination  with Papanicolaou smear of cervix   2. Screening for colorectal cancer   3. Cystocele without uterine prolapse   4. Rectocele       Plan: Pap and physical in 3 years Call if bladder continues to come down and wants to try pessary  Mammogram yearly Labs with PCP  Ask Dr Luan Pulling about DEXA Colonoscopy 2022

## 2016-10-18 LAB — CYTOLOGY - PAP
DIAGNOSIS: NEGATIVE
HPV: NOT DETECTED

## 2016-11-03 ENCOUNTER — Other Ambulatory Visit: Payer: Self-pay | Admitting: Obstetrics and Gynecology

## 2016-11-03 DIAGNOSIS — Z1231 Encounter for screening mammogram for malignant neoplasm of breast: Secondary | ICD-10-CM

## 2016-11-14 ENCOUNTER — Ambulatory Visit (HOSPITAL_COMMUNITY)
Admission: RE | Admit: 2016-11-14 | Discharge: 2016-11-14 | Disposition: A | Discharge status: Home / Self Care | Payer: Medicare Other | Source: Ambulatory Visit | Attending: Obstetrics and Gynecology | Admitting: Obstetrics and Gynecology

## 2016-11-14 DIAGNOSIS — Z1231 Encounter for screening mammogram for malignant neoplasm of breast: Secondary | ICD-10-CM

## 2017-04-11 DIAGNOSIS — H40053 Ocular hypertension, bilateral: Secondary | ICD-10-CM | POA: Diagnosis not present

## 2017-07-03 ENCOUNTER — Other Ambulatory Visit (HOSPITAL_COMMUNITY): Payer: Self-pay | Admitting: Pulmonary Disease

## 2017-07-03 ENCOUNTER — Ambulatory Visit (HOSPITAL_COMMUNITY)
Admission: RE | Admit: 2017-07-03 | Discharge: 2017-07-03 | Disposition: A | Discharge status: Home / Self Care | Payer: Medicare Other | Source: Ambulatory Visit | Attending: Pulmonary Disease | Admitting: Pulmonary Disease

## 2017-07-03 DIAGNOSIS — R0602 Shortness of breath: Secondary | ICD-10-CM

## 2017-07-03 DIAGNOSIS — J90 Pleural effusion, not elsewhere classified: Secondary | ICD-10-CM | POA: Insufficient documentation

## 2017-07-03 DIAGNOSIS — I700 Atherosclerosis of aorta: Secondary | ICD-10-CM | POA: Diagnosis not present

## 2017-07-03 DIAGNOSIS — R079 Chest pain, unspecified: Secondary | ICD-10-CM | POA: Diagnosis not present

## 2017-07-03 DIAGNOSIS — Z0000 Encounter for general adult medical examination without abnormal findings: Secondary | ICD-10-CM | POA: Diagnosis not present

## 2017-07-03 DIAGNOSIS — J189 Pneumonia, unspecified organism: Secondary | ICD-10-CM | POA: Insufficient documentation

## 2017-07-03 DIAGNOSIS — J440 Chronic obstructive pulmonary disease with acute lower respiratory infection: Secondary | ICD-10-CM | POA: Insufficient documentation

## 2017-07-03 DIAGNOSIS — R05 Cough: Secondary | ICD-10-CM | POA: Diagnosis not present

## 2017-07-03 DIAGNOSIS — M810 Age-related osteoporosis without current pathological fracture: Secondary | ICD-10-CM | POA: Diagnosis not present

## 2017-08-03 ENCOUNTER — Other Ambulatory Visit (HOSPITAL_COMMUNITY): Payer: Self-pay | Admitting: Pulmonary Disease

## 2017-08-03 ENCOUNTER — Ambulatory Visit (HOSPITAL_COMMUNITY)
Admission: RE | Admit: 2017-08-03 | Discharge: 2017-08-03 | Disposition: A | Discharge status: Home / Self Care | Payer: Medicare Other | Source: Ambulatory Visit | Attending: Pulmonary Disease | Admitting: Pulmonary Disease

## 2017-08-03 DIAGNOSIS — I700 Atherosclerosis of aorta: Secondary | ICD-10-CM | POA: Diagnosis not present

## 2017-08-03 DIAGNOSIS — J1100 Influenza due to unidentified influenza virus with unspecified type of pneumonia: Secondary | ICD-10-CM | POA: Diagnosis not present

## 2017-08-03 DIAGNOSIS — R05 Cough: Secondary | ICD-10-CM | POA: Diagnosis not present

## 2017-09-18 ENCOUNTER — Encounter: Payer: Self-pay | Admitting: Family Medicine

## 2017-09-18 DIAGNOSIS — Z0000 Encounter for general adult medical examination without abnormal findings: Secondary | ICD-10-CM | POA: Diagnosis not present

## 2017-09-18 DIAGNOSIS — R739 Hyperglycemia, unspecified: Secondary | ICD-10-CM | POA: Diagnosis not present

## 2017-09-18 DIAGNOSIS — M810 Age-related osteoporosis without current pathological fracture: Secondary | ICD-10-CM | POA: Diagnosis not present

## 2017-09-18 DIAGNOSIS — E559 Vitamin D deficiency, unspecified: Secondary | ICD-10-CM | POA: Diagnosis not present

## 2017-09-22 ENCOUNTER — Other Ambulatory Visit (HOSPITAL_COMMUNITY): Payer: Self-pay | Admitting: Pulmonary Disease

## 2017-09-22 DIAGNOSIS — Z780 Asymptomatic menopausal state: Secondary | ICD-10-CM

## 2017-09-22 DIAGNOSIS — Z0000 Encounter for general adult medical examination without abnormal findings: Secondary | ICD-10-CM | POA: Diagnosis not present

## 2017-09-27 ENCOUNTER — Ambulatory Visit (HOSPITAL_COMMUNITY)
Admission: RE | Admit: 2017-09-27 | Discharge: 2017-09-27 | Disposition: A | Discharge status: Home / Self Care | Payer: Medicare Other | Source: Ambulatory Visit | Attending: Pulmonary Disease | Admitting: Pulmonary Disease

## 2017-09-27 DIAGNOSIS — N951 Menopausal and female climacteric states: Secondary | ICD-10-CM | POA: Diagnosis not present

## 2017-09-27 DIAGNOSIS — M8589 Other specified disorders of bone density and structure, multiple sites: Secondary | ICD-10-CM | POA: Insufficient documentation

## 2017-09-27 DIAGNOSIS — Z780 Asymptomatic menopausal state: Secondary | ICD-10-CM

## 2017-09-27 LAB — HM DEXA SCAN

## 2017-10-03 LAB — FECAL OCCULT BLOOD, GUAIAC: Fecal Occult Blood: NEGATIVE

## 2017-10-04 DIAGNOSIS — Z1211 Encounter for screening for malignant neoplasm of colon: Secondary | ICD-10-CM | POA: Diagnosis not present

## 2017-10-11 ENCOUNTER — Other Ambulatory Visit: Payer: Self-pay | Admitting: Obstetrics and Gynecology

## 2017-10-11 DIAGNOSIS — Z1231 Encounter for screening mammogram for malignant neoplasm of breast: Secondary | ICD-10-CM

## 2017-11-03 ENCOUNTER — Other Ambulatory Visit (HOSPITAL_COMMUNITY): Payer: Self-pay | Admitting: Pulmonary Disease

## 2017-11-03 ENCOUNTER — Ambulatory Visit (HOSPITAL_COMMUNITY)
Admission: RE | Admit: 2017-11-03 | Discharge: 2017-11-03 | Disposition: A | Discharge status: Home / Self Care | Payer: Medicare Other | Source: Ambulatory Visit | Attending: Pulmonary Disease | Admitting: Pulmonary Disease

## 2017-11-03 DIAGNOSIS — I700 Atherosclerosis of aorta: Secondary | ICD-10-CM | POA: Diagnosis not present

## 2017-11-03 DIAGNOSIS — J189 Pneumonia, unspecified organism: Secondary | ICD-10-CM | POA: Diagnosis not present

## 2017-11-03 DIAGNOSIS — R079 Chest pain, unspecified: Secondary | ICD-10-CM | POA: Diagnosis not present

## 2017-11-03 DIAGNOSIS — W57XXXA Bitten or stung by nonvenomous insect and other nonvenomous arthropods, initial encounter: Secondary | ICD-10-CM | POA: Diagnosis not present

## 2017-11-03 DIAGNOSIS — I2510 Atherosclerotic heart disease of native coronary artery without angina pectoris: Secondary | ICD-10-CM | POA: Insufficient documentation

## 2017-11-03 DIAGNOSIS — R0602 Shortness of breath: Secondary | ICD-10-CM

## 2017-11-03 DIAGNOSIS — R918 Other nonspecific abnormal finding of lung field: Secondary | ICD-10-CM | POA: Insufficient documentation

## 2017-11-03 DIAGNOSIS — J40 Bronchitis, not specified as acute or chronic: Secondary | ICD-10-CM | POA: Diagnosis not present

## 2017-11-03 DIAGNOSIS — R05 Cough: Secondary | ICD-10-CM | POA: Diagnosis not present

## 2017-11-03 DIAGNOSIS — N200 Calculus of kidney: Secondary | ICD-10-CM | POA: Insufficient documentation

## 2017-11-03 DIAGNOSIS — M810 Age-related osteoporosis without current pathological fracture: Secondary | ICD-10-CM | POA: Diagnosis not present

## 2017-11-03 DIAGNOSIS — Z9049 Acquired absence of other specified parts of digestive tract: Secondary | ICD-10-CM | POA: Insufficient documentation

## 2017-11-03 LAB — POCT I-STAT CREATININE: Creatinine, Ser: 0.7 mg/dL (ref 0.44–1.00)

## 2017-11-03 MED ORDER — IOPAMIDOL (ISOVUE-370) INJECTION 76%
100.0000 mL | Freq: Once | INTRAVENOUS | Status: AC | PRN
  Administered 2017-11-03: 100 mL via INTRAVENOUS

## 2017-11-15 ENCOUNTER — Ambulatory Visit (HOSPITAL_COMMUNITY)
Admission: RE | Admit: 2017-11-15 | Discharge: 2017-11-15 | Disposition: A | Discharge status: Home / Self Care | Payer: Medicare Other | Source: Ambulatory Visit | Attending: Obstetrics and Gynecology | Admitting: Obstetrics and Gynecology

## 2017-11-15 DIAGNOSIS — Z1231 Encounter for screening mammogram for malignant neoplasm of breast: Secondary | ICD-10-CM | POA: Insufficient documentation

## 2017-12-20 ENCOUNTER — Ambulatory Visit (HOSPITAL_COMMUNITY)
Admission: RE | Admit: 2017-12-20 | Discharge: 2017-12-20 | Disposition: A | Discharge status: Home / Self Care | Payer: Medicare Other | Source: Ambulatory Visit | Attending: Pulmonary Disease | Admitting: Pulmonary Disease

## 2017-12-20 ENCOUNTER — Other Ambulatory Visit (HOSPITAL_COMMUNITY): Payer: Self-pay | Admitting: Respiratory Therapy

## 2017-12-20 ENCOUNTER — Other Ambulatory Visit (HOSPITAL_COMMUNITY): Payer: Self-pay | Admitting: Pulmonary Disease

## 2017-12-20 DIAGNOSIS — M25531 Pain in right wrist: Secondary | ICD-10-CM | POA: Diagnosis not present

## 2017-12-20 DIAGNOSIS — M79641 Pain in right hand: Secondary | ICD-10-CM

## 2017-12-20 DIAGNOSIS — R0789 Other chest pain: Secondary | ICD-10-CM | POA: Diagnosis not present

## 2017-12-20 DIAGNOSIS — R0602 Shortness of breath: Secondary | ICD-10-CM | POA: Diagnosis present

## 2017-12-20 DIAGNOSIS — M7989 Other specified soft tissue disorders: Secondary | ICD-10-CM | POA: Insufficient documentation

## 2017-12-20 DIAGNOSIS — R05 Cough: Secondary | ICD-10-CM | POA: Diagnosis not present

## 2017-12-20 DIAGNOSIS — J301 Allergic rhinitis due to pollen: Secondary | ICD-10-CM | POA: Diagnosis not present

## 2017-12-26 ENCOUNTER — Ambulatory Visit (HOSPITAL_COMMUNITY)
Admission: RE | Admit: 2017-12-26 | Discharge: 2017-12-26 | Disposition: A | Discharge status: Home / Self Care | Payer: Medicare Other | Source: Ambulatory Visit | Attending: Pulmonary Disease | Admitting: Pulmonary Disease

## 2017-12-26 DIAGNOSIS — R0602 Shortness of breath: Secondary | ICD-10-CM | POA: Diagnosis not present

## 2017-12-26 LAB — PULMONARY FUNCTION TEST
DL/VA % PRED: 136 %
DL/VA: 6.54 ml/min/mmHg/L
DLCO unc % pred: 81 %
DLCO unc: 19.69 ml/min/mmHg
FEF 25-75 POST: 0.51 L/s
FEF 25-75 Pre: 0.39 L/sec
FEF2575-%Change-Post: 30 %
FEF2575-%PRED-POST: 27 %
FEF2575-%Pred-Pre: 21 %
FEV1-%Change-Post: -4 %
FEV1-%PRED-PRE: 39 %
FEV1-%Pred-Post: 37 %
FEV1-PRE: 0.88 L
FEV1-Post: 0.84 L
FEV1FVC-%CHANGE-POST: -21 %
FEV1FVC-%Pred-Pre: 83 %
FEV6-%CHANGE-POST: 14 %
FEV6-%PRED-PRE: 49 %
FEV6-%Pred-Post: 56 %
FEV6-Post: 1.59 L
FEV6-Pre: 1.39 L
FEV6FVC-%Change-Post: -2 %
FEV6FVC-%Pred-Post: 101 %
FEV6FVC-%Pred-Pre: 103 %
FVC-%CHANGE-POST: 21 %
FVC-%PRED-POST: 57 %
FVC-%PRED-PRE: 47 %
FVC-POST: 1.69 L
FVC-PRE: 1.4 L
POST FEV1/FVC RATIO: 50 %
PRE FEV1/FVC RATIO: 63 %
Post FEV6/FVC ratio: 98 %
Pre FEV6/FVC Ratio: 100 %
RV % pred: 234 %
RV: 5.19 L
TLC % pred: 135 %
TLC: 6.86 L

## 2017-12-26 MED ORDER — ALBUTEROL SULFATE (2.5 MG/3ML) 0.083% IN NEBU
2.5000 mg | INHALATION_SOLUTION | Freq: Once | RESPIRATORY_TRACT | Status: AC
  Administered 2017-12-26: 2.5 mg via RESPIRATORY_TRACT

## 2018-01-10 DIAGNOSIS — M810 Age-related osteoporosis without current pathological fracture: Secondary | ICD-10-CM | POA: Diagnosis not present

## 2018-01-10 DIAGNOSIS — J301 Allergic rhinitis due to pollen: Secondary | ICD-10-CM | POA: Diagnosis not present

## 2018-01-10 DIAGNOSIS — J449 Chronic obstructive pulmonary disease, unspecified: Secondary | ICD-10-CM | POA: Diagnosis not present

## 2018-02-21 DIAGNOSIS — M810 Age-related osteoporosis without current pathological fracture: Secondary | ICD-10-CM | POA: Diagnosis not present

## 2018-02-21 DIAGNOSIS — J301 Allergic rhinitis due to pollen: Secondary | ICD-10-CM | POA: Diagnosis not present

## 2018-02-21 DIAGNOSIS — J449 Chronic obstructive pulmonary disease, unspecified: Secondary | ICD-10-CM | POA: Diagnosis not present

## 2018-04-05 DIAGNOSIS — Z23 Encounter for immunization: Secondary | ICD-10-CM | POA: Diagnosis not present

## 2018-08-02 ENCOUNTER — Ambulatory Visit (HOSPITAL_COMMUNITY)
Admission: RE | Admit: 2018-08-02 | Discharge: 2018-08-02 | Disposition: A | Discharge status: Home / Self Care | Payer: Medicare Other | Source: Ambulatory Visit | Attending: Pulmonary Disease | Admitting: Pulmonary Disease

## 2018-08-02 ENCOUNTER — Other Ambulatory Visit (HOSPITAL_COMMUNITY): Payer: Self-pay | Admitting: Pulmonary Disease

## 2018-08-02 DIAGNOSIS — R0602 Shortness of breath: Secondary | ICD-10-CM | POA: Diagnosis not present

## 2018-08-02 DIAGNOSIS — R05 Cough: Secondary | ICD-10-CM | POA: Diagnosis not present

## 2018-08-02 DIAGNOSIS — J40 Bronchitis, not specified as acute or chronic: Secondary | ICD-10-CM | POA: Diagnosis not present

## 2018-08-02 DIAGNOSIS — R059 Cough, unspecified: Secondary | ICD-10-CM

## 2018-09-12 ENCOUNTER — Other Ambulatory Visit: Payer: Self-pay

## 2018-09-12 ENCOUNTER — Other Ambulatory Visit (HOSPITAL_COMMUNITY): Payer: Self-pay | Admitting: Pulmonary Disease

## 2018-09-12 ENCOUNTER — Ambulatory Visit (HOSPITAL_COMMUNITY)
Admission: RE | Admit: 2018-09-12 | Discharge: 2018-09-12 | Disposition: A | Discharge status: Home / Self Care | Payer: Medicare Other | Source: Ambulatory Visit | Attending: Pulmonary Disease | Admitting: Pulmonary Disease

## 2018-09-12 DIAGNOSIS — R05 Cough: Secondary | ICD-10-CM

## 2018-09-12 DIAGNOSIS — R059 Cough, unspecified: Secondary | ICD-10-CM

## 2018-09-12 DIAGNOSIS — J301 Allergic rhinitis due to pollen: Secondary | ICD-10-CM | POA: Diagnosis not present

## 2018-09-12 DIAGNOSIS — J441 Chronic obstructive pulmonary disease with (acute) exacerbation: Secondary | ICD-10-CM | POA: Diagnosis not present

## 2018-09-25 DIAGNOSIS — Z0000 Encounter for general adult medical examination without abnormal findings: Secondary | ICD-10-CM | POA: Diagnosis not present

## 2018-10-16 IMAGING — DX DG HAND COMPLETE 3+V*R*
3 series · 3 of 3 positions shown · non-contrast
Comparison: None.

CLINICAL DATA: Pt c/o dorsal right hand and wrist swelling, pain,
and redness since [REDACTED]. Hot to touch. Radiates up to elbow.
Fingers are stiff.

EXAM:
RIGHT HAND - COMPLETE 3+ VIEW

[hand pa]
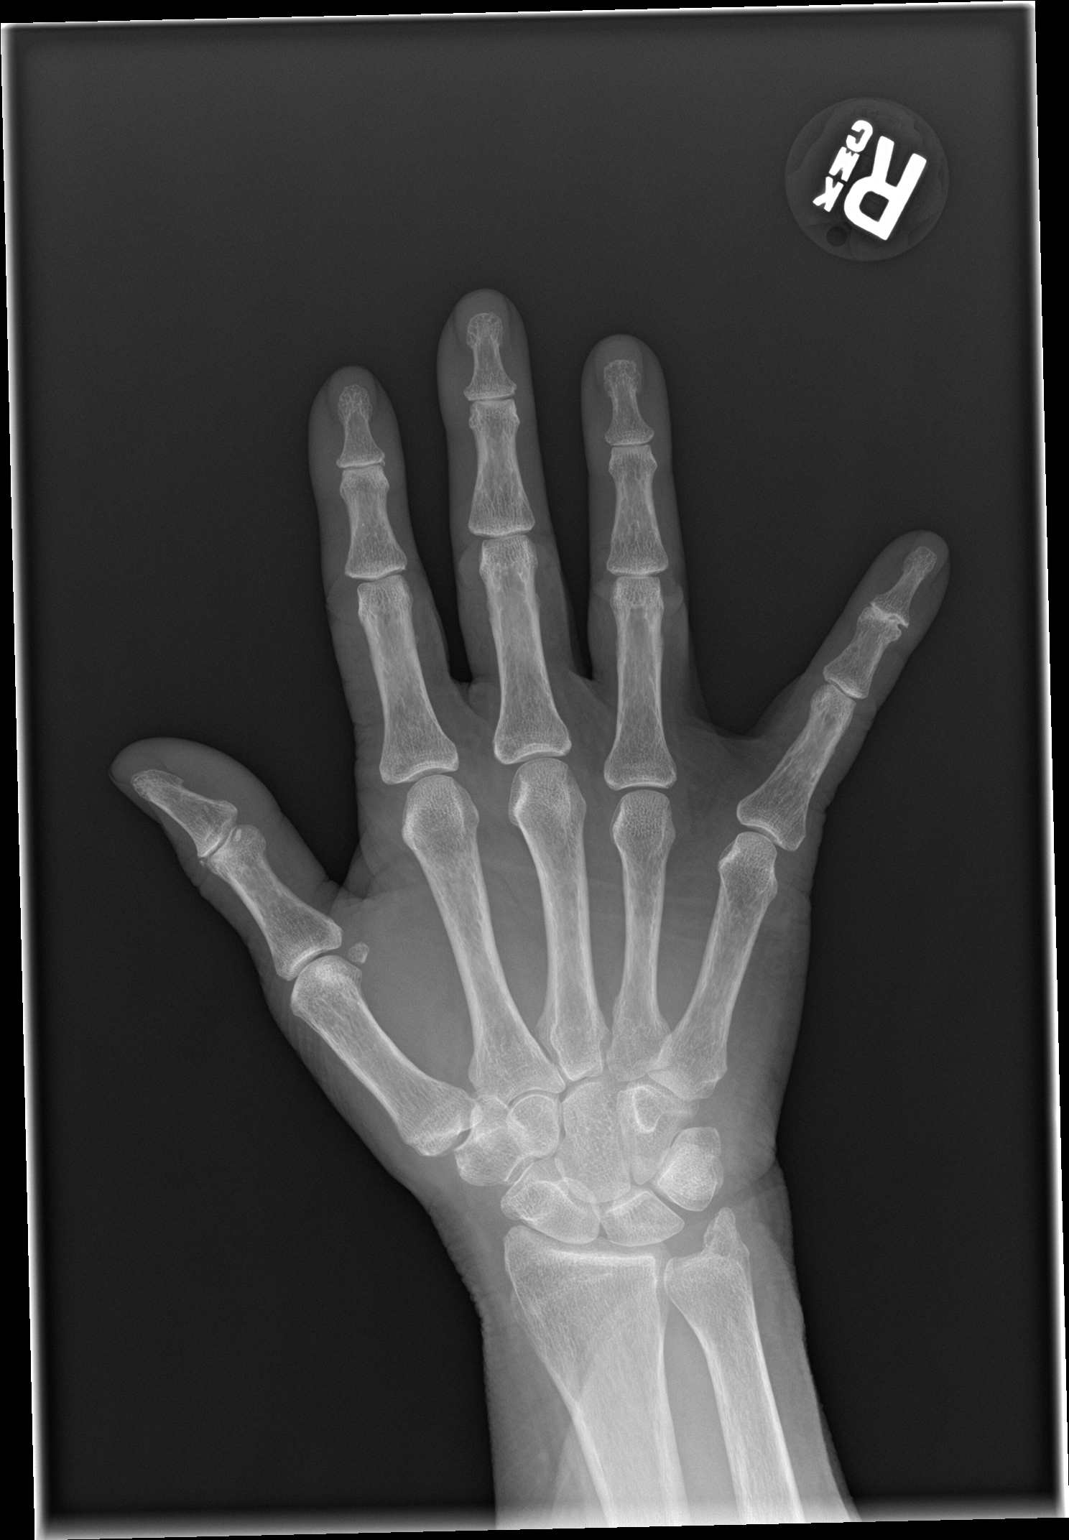

[hand obl]
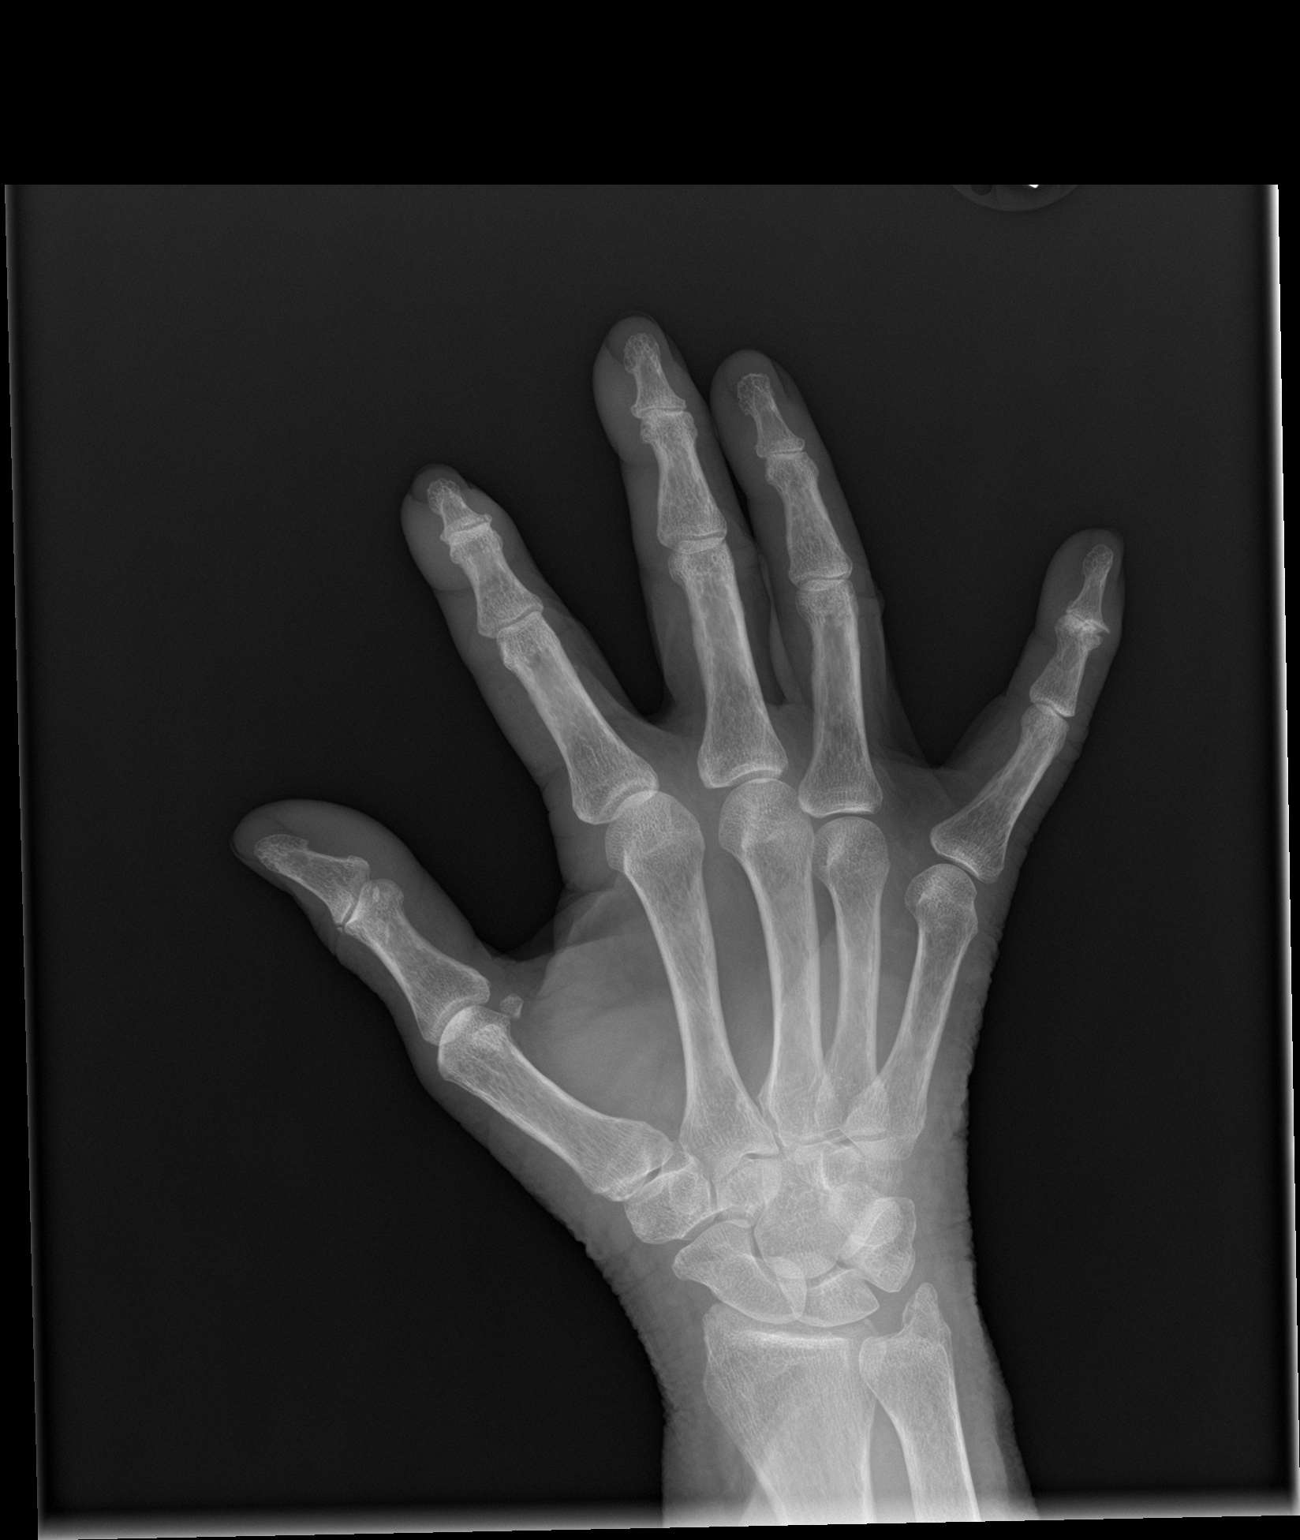

[hand lat]
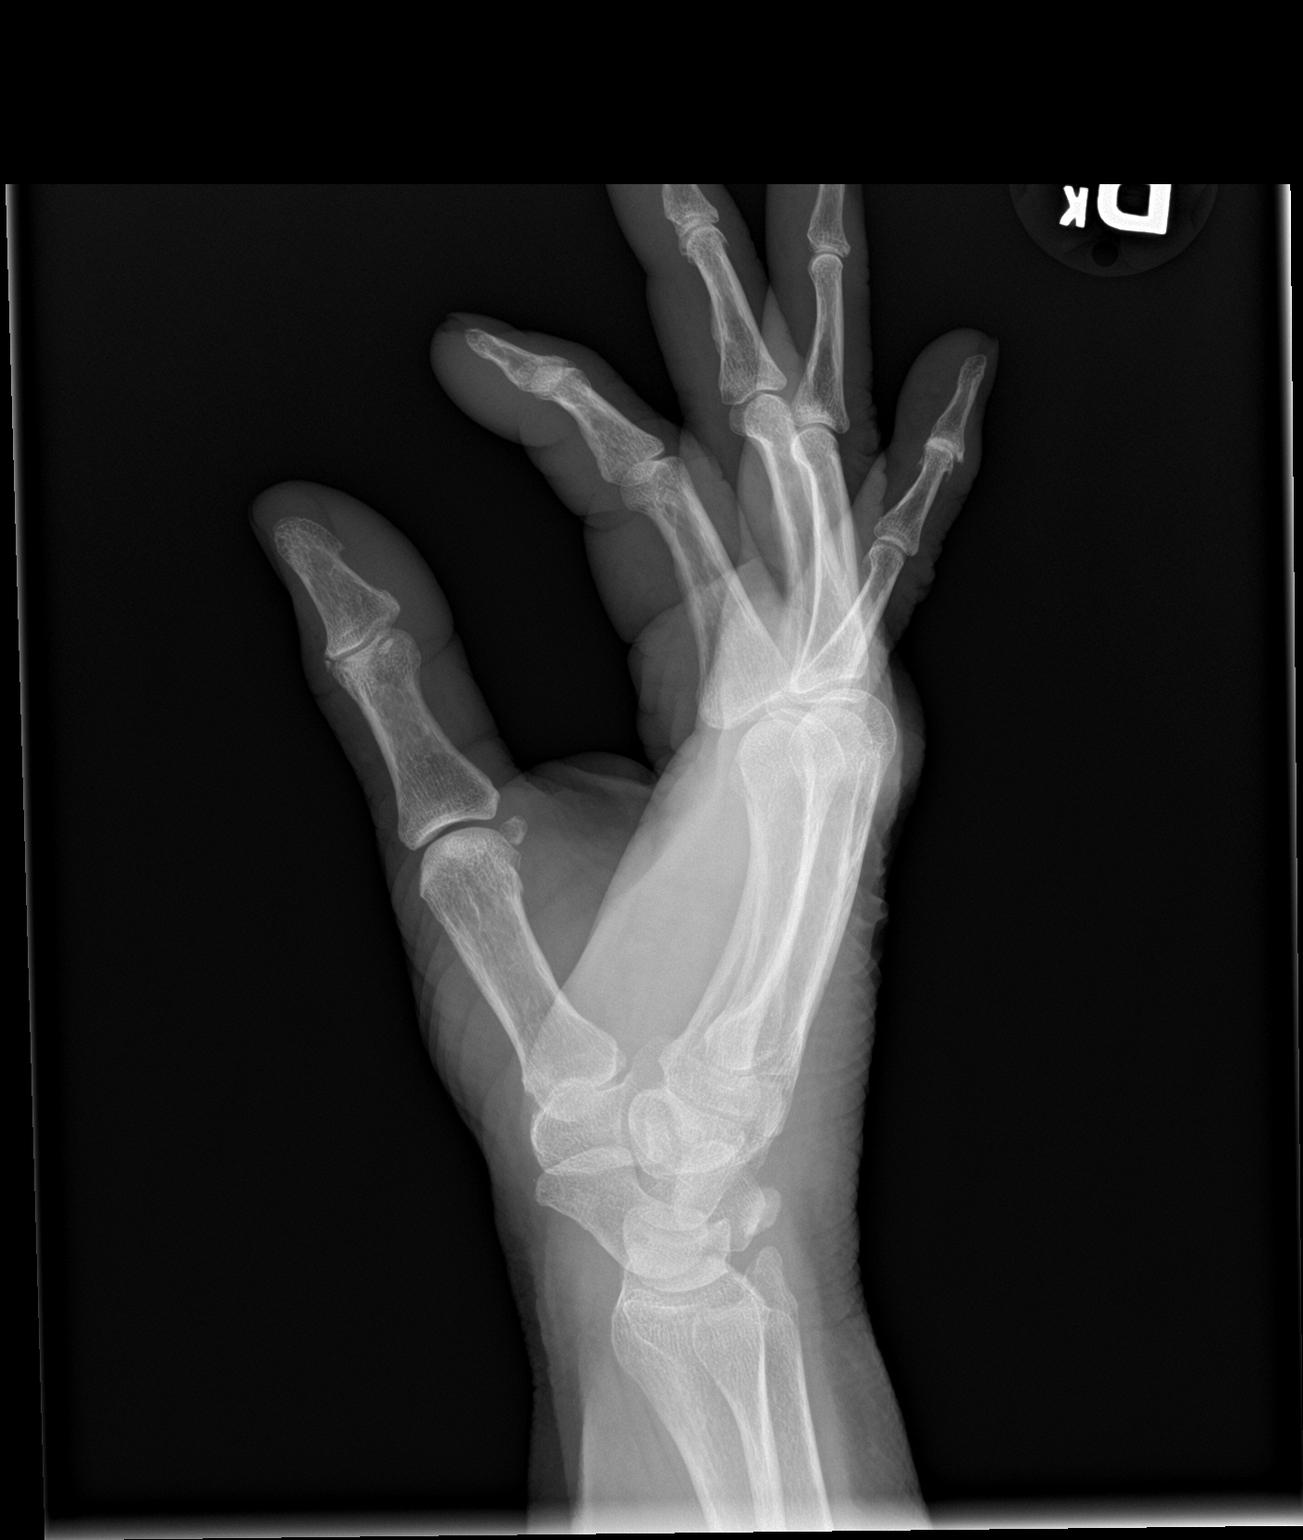

[3 of 3 positions shown; findings below may reference images not displayed]

FINDINGS: Osseous alignment is normal. No acute or suspicious osseous lesion.
No fracture line or displaced fracture fragment. Adjacent soft
tissues are unremarkable. No soft tissue gas.

Mild degenerative change noted at the fifth DIP joint, probably
chronic erosive osteoarthritis. No additional erosions seen to
suggest a polyarticular inflammatory arthritis.
IMPRESSION: 1. No acute findings.
2. Mild degenerative change at the fifth DIP joint, likely mild
chronic erosive osteoarthritis.

## 2018-10-16 IMAGING — DX DG WRIST COMPLETE 3+V*R*
4 series · 4 of 4 positions shown · non-contrast
Comparison: None.

CLINICAL DATA: Wrist swelling and pain.

EXAM:
RIGHT WRIST - COMPLETE 3+ VIEW

[wrist pa]
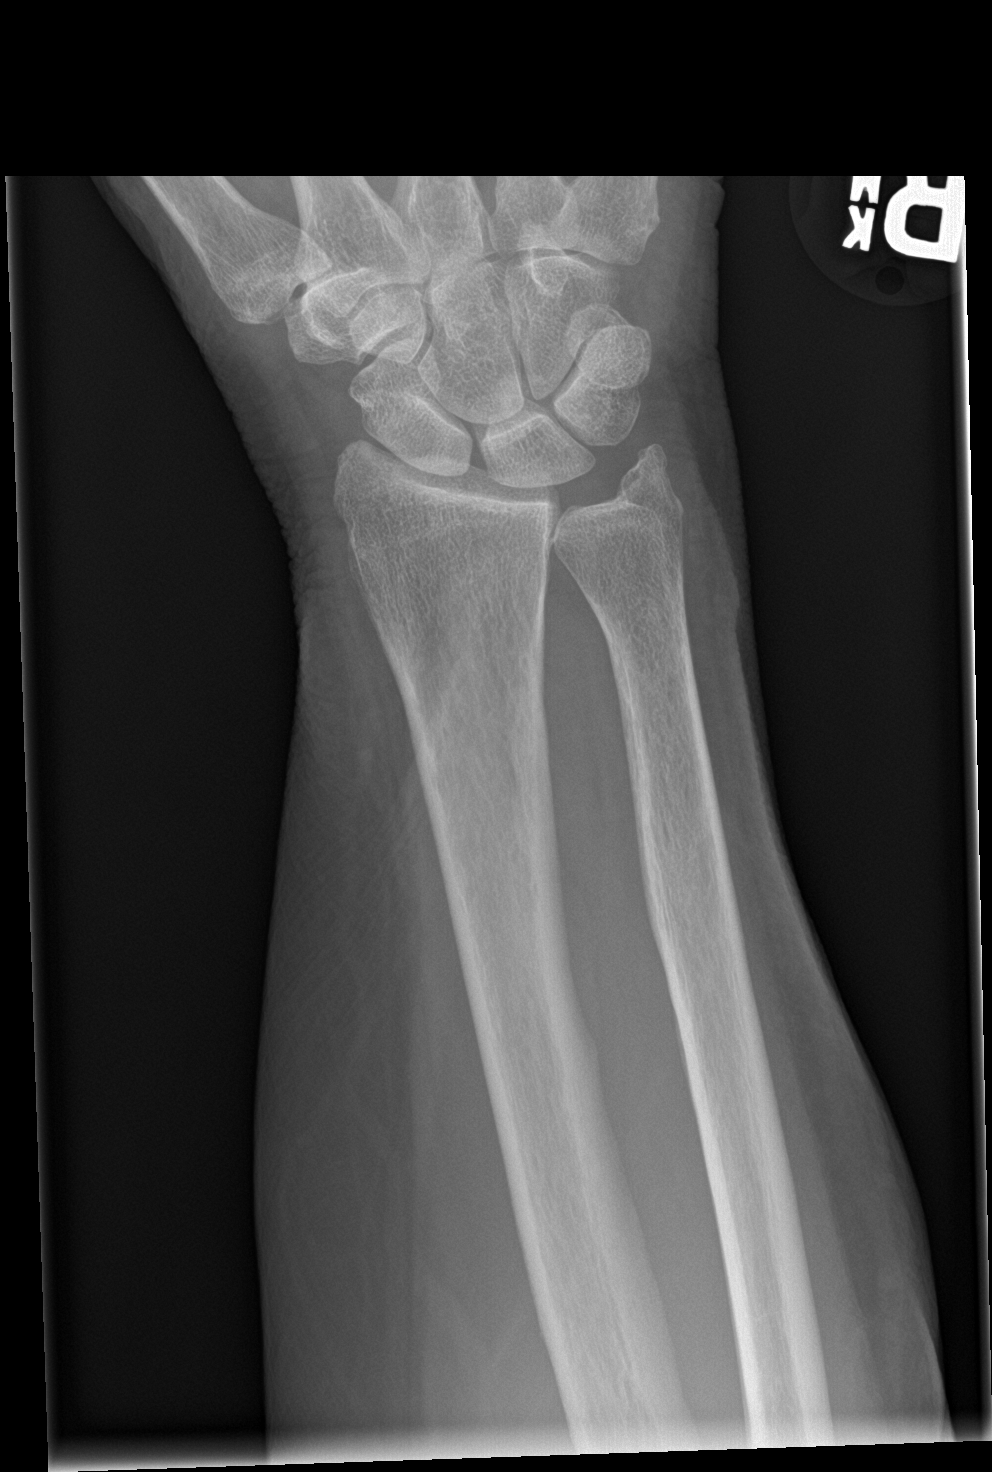

[wrist obl]
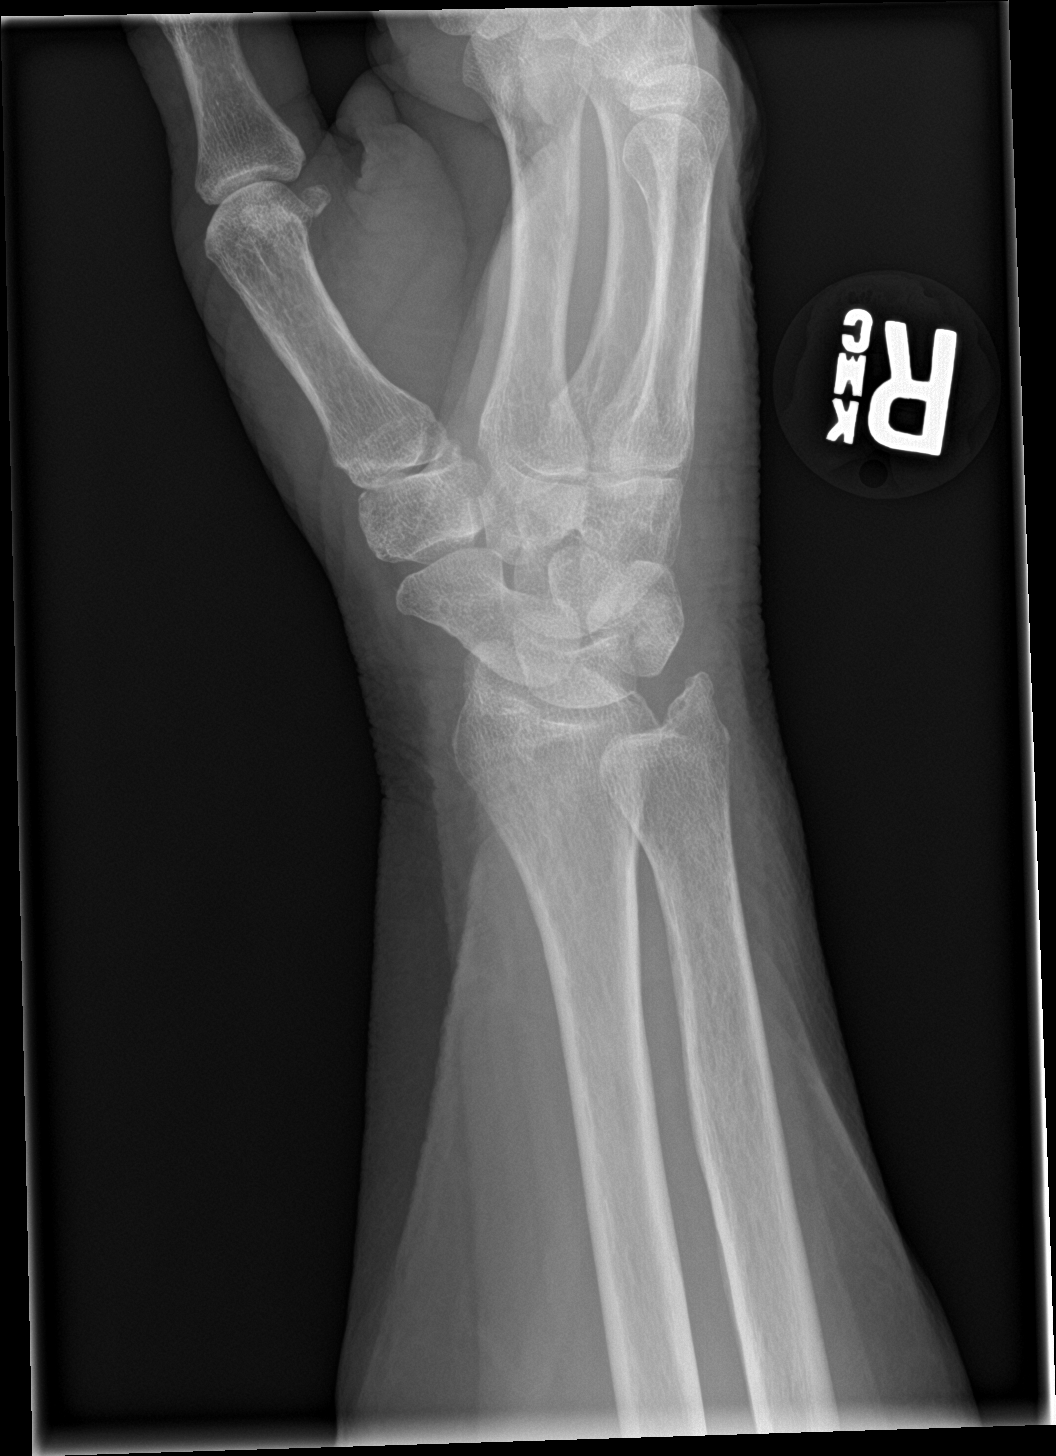

[wrist lat]
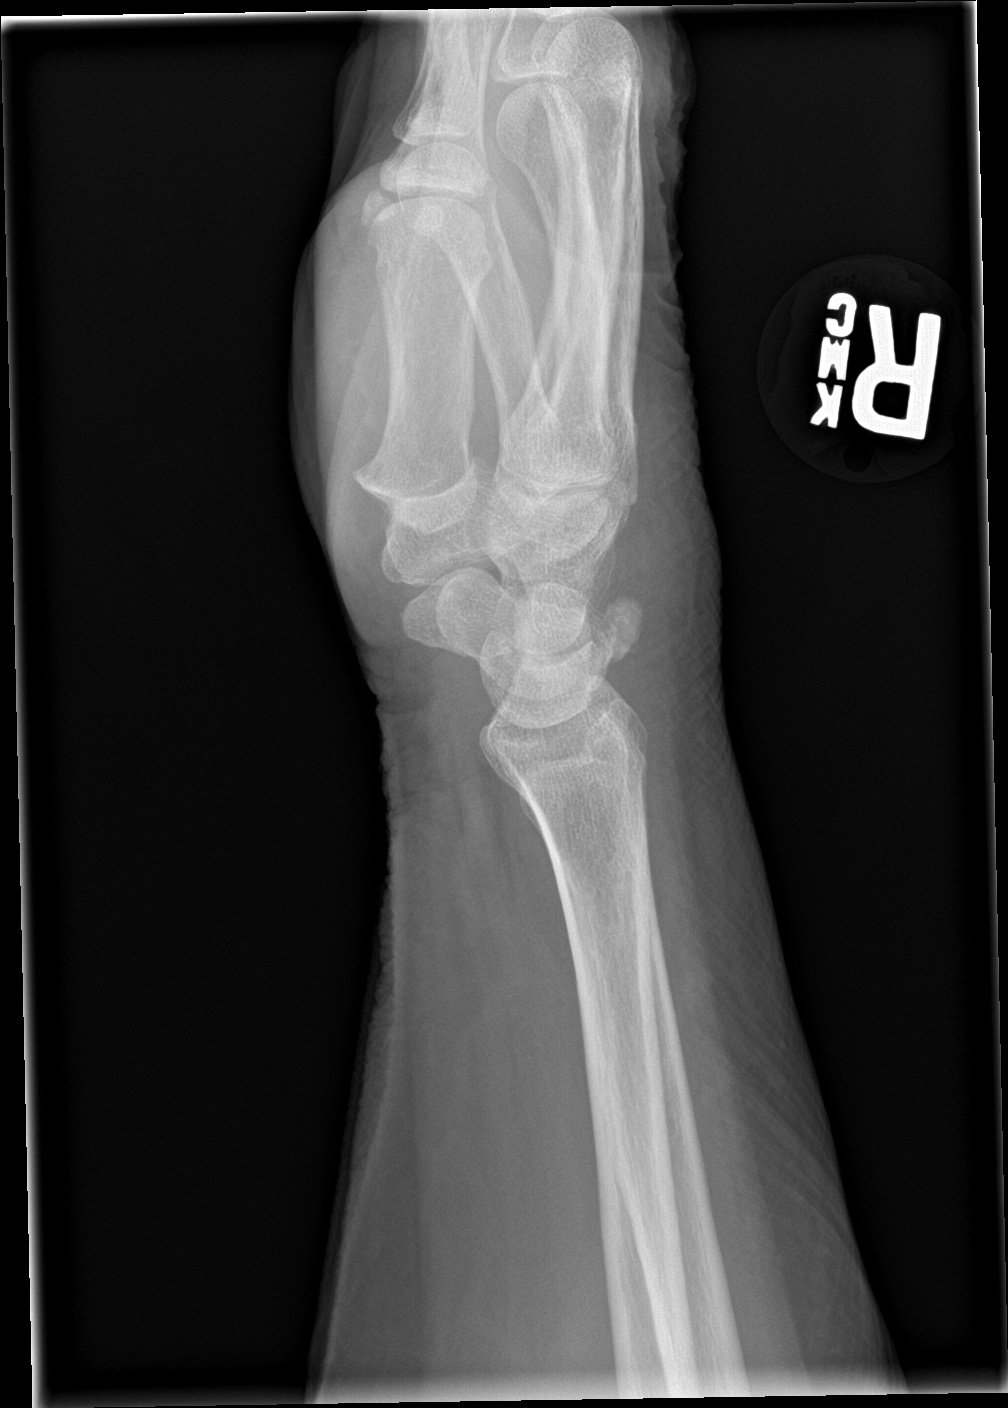

[wrist navicular]
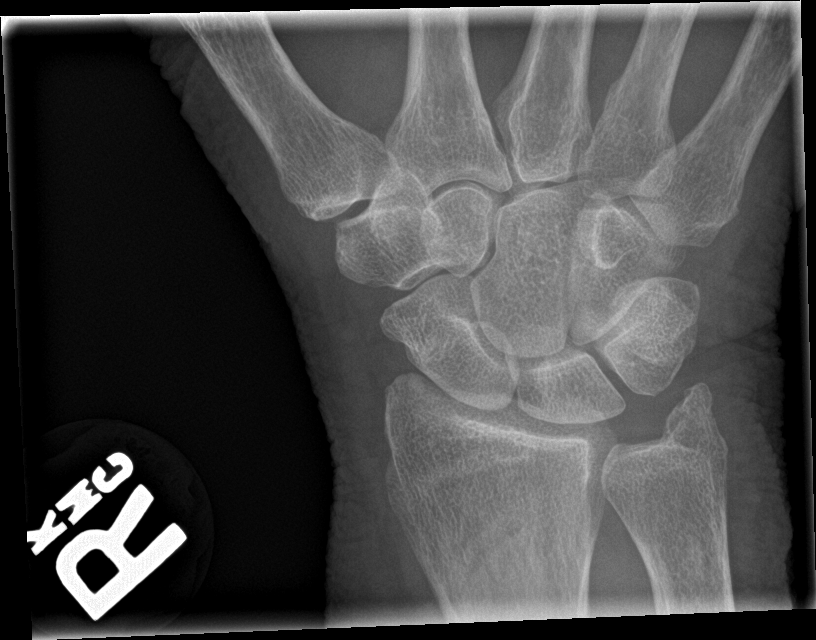

[4 of 4 positions shown; findings below may reference images not displayed]

FINDINGS: No acute fracture or dislocation. Joint spaces are preserved. Bone
mineralization is normal. Mild diffuse soft tissue swelling.
IMPRESSION: Mild diffuse soft tissue swelling.  No acute osseous abnormality.

## 2018-12-25 ENCOUNTER — Other Ambulatory Visit (HOSPITAL_COMMUNITY): Payer: Self-pay | Admitting: Obstetrics and Gynecology

## 2018-12-25 DIAGNOSIS — Z1231 Encounter for screening mammogram for malignant neoplasm of breast: Secondary | ICD-10-CM

## 2019-01-02 ENCOUNTER — Ambulatory Visit (HOSPITAL_COMMUNITY)
Admission: RE | Admit: 2019-01-02 | Discharge: 2019-01-02 | Disposition: A | Discharge status: Home / Self Care | Payer: Medicare Other | Source: Ambulatory Visit | Attending: Obstetrics and Gynecology | Admitting: Obstetrics and Gynecology

## 2019-01-02 ENCOUNTER — Other Ambulatory Visit: Payer: Self-pay

## 2019-01-02 DIAGNOSIS — Z1231 Encounter for screening mammogram for malignant neoplasm of breast: Secondary | ICD-10-CM | POA: Diagnosis not present

## 2019-01-02 LAB — HM MAMMOGRAPHY

## 2019-03-26 DIAGNOSIS — M810 Age-related osteoporosis without current pathological fracture: Secondary | ICD-10-CM | POA: Diagnosis not present

## 2019-03-26 DIAGNOSIS — J449 Chronic obstructive pulmonary disease, unspecified: Secondary | ICD-10-CM | POA: Diagnosis not present

## 2019-03-26 DIAGNOSIS — Z0000 Encounter for general adult medical examination without abnormal findings: Secondary | ICD-10-CM | POA: Diagnosis not present

## 2019-04-25 DIAGNOSIS — Z0000 Encounter for general adult medical examination without abnormal findings: Secondary | ICD-10-CM | POA: Diagnosis not present

## 2019-04-25 DIAGNOSIS — Z23 Encounter for immunization: Secondary | ICD-10-CM | POA: Diagnosis not present

## 2019-05-09 DIAGNOSIS — Z1211 Encounter for screening for malignant neoplasm of colon: Secondary | ICD-10-CM | POA: Diagnosis not present

## 2019-05-09 DIAGNOSIS — Z1212 Encounter for screening for malignant neoplasm of rectum: Secondary | ICD-10-CM | POA: Diagnosis not present

## 2019-05-25 DIAGNOSIS — M810 Age-related osteoporosis without current pathological fracture: Secondary | ICD-10-CM | POA: Insufficient documentation

## 2019-05-25 DIAGNOSIS — E669 Obesity, unspecified: Secondary | ICD-10-CM

## 2019-05-25 DIAGNOSIS — J301 Allergic rhinitis due to pollen: Secondary | ICD-10-CM

## 2019-05-25 DIAGNOSIS — R0789 Other chest pain: Secondary | ICD-10-CM

## 2019-05-25 DIAGNOSIS — R0602 Shortness of breath: Secondary | ICD-10-CM

## 2019-05-25 DIAGNOSIS — J449 Chronic obstructive pulmonary disease, unspecified: Secondary | ICD-10-CM

## 2019-07-11 ENCOUNTER — Ambulatory Visit: Payer: Medicare Other | Admitting: Family Medicine

## 2019-07-16 ENCOUNTER — Emergency Department (HOSPITAL_COMMUNITY): Payer: Medicare Other

## 2019-07-16 ENCOUNTER — Other Ambulatory Visit: Payer: Self-pay

## 2019-07-16 ENCOUNTER — Encounter (HOSPITAL_COMMUNITY): Payer: Self-pay

## 2019-07-16 ENCOUNTER — Inpatient Hospital Stay (HOSPITAL_COMMUNITY)
Admission: EM | Admit: 2019-07-16 | Discharge: 2019-07-22 | DRG: 177 | Disposition: A | Discharge status: Home / Self Care | Payer: Medicare Other | Attending: Internal Medicine | Admitting: Internal Medicine

## 2019-07-16 DIAGNOSIS — M810 Age-related osteoporosis without current pathological fracture: Secondary | ICD-10-CM | POA: Diagnosis present

## 2019-07-16 DIAGNOSIS — J440 Chronic obstructive pulmonary disease with acute lower respiratory infection: Secondary | ICD-10-CM | POA: Diagnosis present

## 2019-07-16 DIAGNOSIS — U071 COVID-19: Secondary | ICD-10-CM | POA: Diagnosis not present

## 2019-07-16 DIAGNOSIS — I501 Left ventricular failure: Secondary | ICD-10-CM | POA: Diagnosis present

## 2019-07-16 DIAGNOSIS — Z823 Family history of stroke: Secondary | ICD-10-CM

## 2019-07-16 DIAGNOSIS — R072 Precordial pain: Secondary | ICD-10-CM | POA: Diagnosis not present

## 2019-07-16 DIAGNOSIS — J449 Chronic obstructive pulmonary disease, unspecified: Secondary | ICD-10-CM

## 2019-07-16 DIAGNOSIS — J9601 Acute respiratory failure with hypoxia: Secondary | ICD-10-CM | POA: Diagnosis present

## 2019-07-16 DIAGNOSIS — Z881 Allergy status to other antibiotic agents status: Secondary | ICD-10-CM

## 2019-07-16 DIAGNOSIS — J1282 Pneumonia due to coronavirus disease 2019: Secondary | ICD-10-CM | POA: Diagnosis present

## 2019-07-16 DIAGNOSIS — E871 Hypo-osmolality and hyponatremia: Secondary | ICD-10-CM | POA: Diagnosis present

## 2019-07-16 DIAGNOSIS — Z7982 Long term (current) use of aspirin: Secondary | ICD-10-CM

## 2019-07-16 DIAGNOSIS — Z888 Allergy status to other drugs, medicaments and biological substances status: Secondary | ICD-10-CM

## 2019-07-16 DIAGNOSIS — I21A1 Myocardial infarction type 2: Secondary | ICD-10-CM | POA: Diagnosis present

## 2019-07-16 DIAGNOSIS — Z87891 Personal history of nicotine dependence: Secondary | ICD-10-CM

## 2019-07-16 DIAGNOSIS — R778 Other specified abnormalities of plasma proteins: Secondary | ICD-10-CM

## 2019-07-16 DIAGNOSIS — Z7901 Long term (current) use of anticoagulants: Secondary | ICD-10-CM

## 2019-07-16 DIAGNOSIS — E785 Hyperlipidemia, unspecified: Secondary | ICD-10-CM

## 2019-07-16 DIAGNOSIS — Z885 Allergy status to narcotic agent status: Secondary | ICD-10-CM

## 2019-07-16 DIAGNOSIS — I214 Non-ST elevation (NSTEMI) myocardial infarction: Secondary | ICD-10-CM

## 2019-07-16 DIAGNOSIS — I700 Atherosclerosis of aorta: Secondary | ICD-10-CM | POA: Diagnosis present

## 2019-07-16 DIAGNOSIS — E875 Hyperkalemia: Secondary | ICD-10-CM | POA: Diagnosis present

## 2019-07-16 LAB — RESPIRATORY PANEL BY RT PCR (FLU A&B, COVID)
Influenza A by PCR: NEGATIVE
Influenza B by PCR: NEGATIVE
SARS Coronavirus 2 by RT PCR: POSITIVE — AB

## 2019-07-16 LAB — D-DIMER, QUANTITATIVE: D-Dimer, Quant: 11.84 ug/mL-FEU — ABNORMAL HIGH (ref 0.00–0.50)

## 2019-07-16 LAB — CBC WITH DIFFERENTIAL/PLATELET
Abs Immature Granulocytes: 0.21 10*3/uL — ABNORMAL HIGH (ref 0.00–0.07)
Basophils Absolute: 0 10*3/uL (ref 0.0–0.1)
Basophils Relative: 0 %
Eosinophils Absolute: 0 10*3/uL (ref 0.0–0.5)
Eosinophils Relative: 0 %
HCT: 37 % (ref 36.0–46.0)
Hemoglobin: 12.3 g/dL (ref 12.0–15.0)
Immature Granulocytes: 1 %
Lymphocytes Relative: 11 %
Lymphs Abs: 1.6 10*3/uL (ref 0.7–4.0)
MCH: 29.3 pg (ref 26.0–34.0)
MCHC: 33.2 g/dL (ref 30.0–36.0)
MCV: 88.1 fL (ref 80.0–100.0)
Monocytes Absolute: 1.2 10*3/uL — ABNORMAL HIGH (ref 0.1–1.0)
Monocytes Relative: 8 %
Neutro Abs: 11.8 10*3/uL — ABNORMAL HIGH (ref 1.7–7.7)
Neutrophils Relative %: 80 %
Platelets: 398 10*3/uL (ref 150–400)
RBC: 4.2 MIL/uL (ref 3.87–5.11)
RDW: 15 % (ref 11.5–15.5)
WBC: 15 10*3/uL — ABNORMAL HIGH (ref 4.0–10.5)
nRBC: 0 % (ref 0.0–0.2)

## 2019-07-16 LAB — COMPREHENSIVE METABOLIC PANEL
ALT: 43 U/L (ref 0–44)
AST: 49 U/L — ABNORMAL HIGH (ref 15–41)
Albumin: 2.4 g/dL — ABNORMAL LOW (ref 3.5–5.0)
Alkaline Phosphatase: 129 U/L — ABNORMAL HIGH (ref 38–126)
Anion gap: 9 (ref 5–15)
BUN: 20 mg/dL (ref 8–23)
CO2: 25 mmol/L (ref 22–32)
Calcium: 8.1 mg/dL — ABNORMAL LOW (ref 8.9–10.3)
Chloride: 100 mmol/L (ref 98–111)
Creatinine, Ser: 0.72 mg/dL (ref 0.44–1.00)
GFR calc Af Amer: >60 mL/min (ref >60)
GFR calc non Af Amer: >60 mL/min (ref >60)
Glucose, Bld: 131 mg/dL — ABNORMAL HIGH (ref 70–99)
Potassium: 3.6 mmol/L (ref 3.5–5.1)
Sodium: 134 mmol/L — ABNORMAL LOW (ref 135–145)
Total Bilirubin: 0.6 mg/dL (ref 0.3–1.2)
Total Protein: 6.8 g/dL (ref 6.5–8.1)

## 2019-07-16 LAB — LACTATE DEHYDROGENASE: LDH: 325 U/L — ABNORMAL HIGH (ref 98–192)

## 2019-07-16 LAB — TRIGLYCERIDES: Triglycerides: 180 mg/dL — ABNORMAL HIGH (ref <150)

## 2019-07-16 LAB — FERRITIN: Ferritin: 737 ng/mL — ABNORMAL HIGH (ref 11–307)

## 2019-07-16 LAB — FIBRINOGEN: Fibrinogen: >800 mg/dL — ABNORMAL HIGH (ref 210–475)

## 2019-07-16 LAB — TROPONIN I (HIGH SENSITIVITY): Troponin I (High Sensitivity): 109 ng/L (ref <18)

## 2019-07-16 LAB — PROCALCITONIN: Procalcitonin: 0.2 ng/mL

## 2019-07-16 LAB — POC SARS CORONAVIRUS 2 AG -  ED: SARS Coronavirus 2 Ag: NEGATIVE

## 2019-07-16 LAB — LACTIC ACID, PLASMA: Lactic Acid, Venous: 1.6 mmol/L (ref 0.5–1.9)

## 2019-07-16 LAB — C-REACTIVE PROTEIN: CRP: 37.8 mg/dL — ABNORMAL HIGH (ref <1.0)

## 2019-07-16 MED ORDER — SODIUM CHLORIDE 0.9 % IV SOLN
100.0000 mg | Freq: Every day | INTRAVENOUS | Status: AC
  Administered 2019-07-17 – 2019-07-19 (×4): 100 mg via INTRAVENOUS
  Filled 2019-07-16 (×5): qty 20

## 2019-07-16 MED ORDER — SODIUM CHLORIDE 0.9 % IV SOLN
200.0000 mg | Freq: Once | INTRAVENOUS | Status: AC
  Administered 2019-07-16: 200 mg via INTRAVENOUS
  Filled 2019-07-16: qty 40

## 2019-07-16 MED ORDER — SODIUM CHLORIDE 0.9 % IV SOLN
100.0000 mg | Freq: Every day | INTRAVENOUS | Status: DC
  Filled 2019-07-16: qty 20

## 2019-07-16 MED ORDER — IOHEXOL 350 MG/ML SOLN
100.0000 mL | Freq: Once | INTRAVENOUS | Status: AC | PRN
  Administered 2019-07-16: 100 mL via INTRAVENOUS

## 2019-07-16 MED ORDER — SODIUM CHLORIDE 0.9 % IV SOLN
200.0000 mg | Freq: Once | INTRAVENOUS | Status: DC
  Filled 2019-07-16: qty 40

## 2019-07-16 MED ORDER — DEXAMETHASONE SODIUM PHOSPHATE 10 MG/ML IJ SOLN
10.0000 mg | Freq: Once | INTRAMUSCULAR | Status: AC
  Administered 2019-07-16: 10 mg via INTRAVENOUS
  Filled 2019-07-16: qty 1

## 2019-07-16 MED ORDER — SODIUM CHLORIDE 0.9 % IV SOLN
1.0000 g | Freq: Once | INTRAVENOUS | Status: AC
  Administered 2019-07-16: 1 g via INTRAVENOUS
  Filled 2019-07-16: qty 10

## 2019-07-16 MED ORDER — SODIUM CHLORIDE 0.9 % IV SOLN
500.0000 mg | INTRAVENOUS | Status: DC
  Administered 2019-07-16: 500 mg via INTRAVENOUS
  Filled 2019-07-16: qty 500

## 2019-07-16 NOTE — ED Provider Notes (Signed)
Emergency Department Provider Note   I have reviewed the triage vital signs and the nursing notes.   HISTORY  Chief Complaint Shortness of Breath and Fever   HPI Jessica Lewis is a 74 y.o. female with PMH of CODP not on home O2 presents to the ED for evaluation of fever, chills, SOB, and body aches over the last 12 days.  The patient's husband has similar symptoms and they both presented to the emergency department today.  She tells me he was diagnosed with pneumonia and admitted to the hospital.  Patient states that her shortness of breath is particularly concerning to her and the reason for her ED presentation today.  She describes both shortness of breath at rest but states it is worse with exertion.  She is only able to take a few steps before she feels short of breath and has to stop for rest which is very unusual for her. She has been using her home albuterol with temporary improvement in symptoms but states that shortness of breath returns.  Fever has also been intermittent and improves initially with Tylenol but then returns.  She does me that she is not had chest pain but shortly after arriving to the emergency department she did develop some right-sided tightness in her chest which is mild, not pleuritic.   Past Medical History:  Diagnosis Date   Acute bronchitis    Age-related osteoporosis without current pathological fracture    Allergic rhinitis due to pollen    Bronchitis    Chronic obstructive pulmonary disease, unspecified ([place])    Cystocele 10/14/2015   Headache(784.0)    menstral migraines   Influenza due to unidentified influenza virus with other respiratory manifestations    Obesity    Obesity, unspecified    Osteoporosis    Osteoporosis, unspecified    Other chest pain    Rectocele 11/16/2012   Rosacea    Shortness of breath     Patient Active Problem List   Diagnosis Date Noted   Age-related osteoporosis without current pathological  fracture    Allergic rhinitis due to pollen    Chronic obstructive pulmonary disease, unspecified ([place])    Obesity, unspecified    Other chest pain    Shortness of breath    Osteoporosis    Cystocele without uterine prolapse 10/17/2016   Cystocele 10/14/2015   Osteopenia 11/16/2012   Rectocele 11/16/2012   Migraines 11/15/2012    Past Surgical History:  Procedure Laterality Date   CHOLECYSTECTOMY     COLONOSCOPY  02/09/2011   Procedure: COLONOSCOPY;  Surgeon: Rogene Houston, MD;  Location: [facility];  Service: Endoscopy;  Laterality: N/A;   TUBAL LIGATION Bilateral     Allergies Codeine, Tetracyclines & related, and Claritin [loratadine]  Family History  Problem Relation Age of Onset   Stroke Mother     Social History Social History   Tobacco Use   Smoking status: Former Smoker    Types: Cigarettes   Smokeless tobacco: Never Used  Substance Use Topics   Alcohol use: No   Drug use: No    Review of Systems  Constitutional: Positive fever/chills Eyes: No visual changes. ENT: No sore throat. Cardiovascular: Positive chest pain. Respiratory: Positive shortness of breath. Gastrointestinal: No abdominal pain.  No nausea, no vomiting.  No diarrhea.  No constipation. Genitourinary: Negative for dysuria. Musculoskeletal: Negative for back pain. Positive joint aches.  Skin: Negative for rash. Neurological: Negative for headaches, focal weakness or numbness.  10-point ROS  otherwise negative.  ____________________________________________   PHYSICAL EXAM:  VITAL SIGNS: ED Triage Vitals  Enc Vitals Group     BP 07/16/19 1748 137/76     Pulse Rate 07/16/19 1748 (!) 114     Resp 07/16/19 1748 (!) 32     Temp 07/16/19 1748 97.8 F (36.6 C)     Temp Source 07/16/19 1748 Oral     SpO2 07/16/19 1748 (!) 75 %     Weight 07/16/19 1745 213 lb (96.6 kg)     Height 07/16/19 1745 5\' 4"  (1.626 m)   Constitutional: Alert and oriented. Well appearing  and in no acute distress. Eyes: Conjunctivae are normal.  Head: Atraumatic. Nose: No congestion/rhinnorhea. Mouth/Throat: Mucous membranes are moist.   Neck: No stridor.  Cardiovascular: Tachycardia. Good peripheral circulation. Grossly normal heart sounds.   Respiratory: Slight increased respiratory effort.  No retractions. Lungs CTAB. No wheezing.  Gastrointestinal: Soft and nontender. No distention.  Musculoskeletal: No lower extremity tenderness nor edema. No gross deformities of extremities. Neurologic:  Normal speech and language.  Skin:  Skin is warm, dry and intact. No rash noted.   ____________________________________________   LABS (all labs ordered are listed, but only abnormal results are displayed)  Labs Reviewed  RESPIRATORY PANEL BY RT PCR (FLU A&B, COVID) - Abnormal; Notable for the following components:      Result Value   SARS Coronavirus 2 by RT PCR POSITIVE (*)    All other components within normal limits  CBC WITH DIFFERENTIAL/PLATELET - Abnormal; Notable for the following components:   WBC 15.0 (*)    Neutro Abs 11.8 (*)    Monocytes Absolute 1.2 (*)    Abs Immature Granulocytes 0.21 (*)    All other components within normal limits  COMPREHENSIVE METABOLIC PANEL - Abnormal; Notable for the following components:   Sodium 134 (*)    Glucose, Bld 131 (*)    Calcium 8.1 (*)    Albumin 2.4 (*)    AST 49 (*)    Alkaline Phosphatase 129 (*)    All other components within normal limits  D-DIMER, QUANTITATIVE (NOT AT [facility]) - Abnormal; Notable for the following components:   D-Dimer, Quant 11.84 (*)    All other components within normal limits  LACTATE DEHYDROGENASE - Abnormal; Notable for the following components:   LDH 325 (*)    All other components within normal limits  FERRITIN - Abnormal; Notable for the following components:   Ferritin 737 (*)    All other components within normal limits  TRIGLYCERIDES - Abnormal; Notable for the following components:    Triglycerides 180 (*)    All other components within normal limits  FIBRINOGEN - Abnormal; Notable for the following components:   Fibrinogen >800 (*)    All other components within normal limits  C-REACTIVE PROTEIN - Abnormal; Notable for the following components:   CRP 37.8 (*)    All other components within normal limits  TROPONIN I (HIGH SENSITIVITY) - Abnormal; Notable for the following components:   Troponin I (High Sensitivity) 109 (*)    All other components within normal limits  CULTURE, BLOOD (ROUTINE X 2)  CULTURE, BLOOD (ROUTINE X 2)  LACTIC ACID, PLASMA  PROCALCITONIN  POC SARS CORONAVIRUS 2 AG -  ED   ____________________________________________  EKG   EKG Interpretation  Date/Time:  Tuesday July 16 2019 21:13:35 EST Ventricular Rate:  97 PR Interval:    QRS Duration: 101 QT Interval:  390 QTC Calculation: 496 R Axis:  79 Text Interpretation: Sinus rhythm Borderline prolonged QT interval Improved baseline and HR. No significant ST changes. No STEMI Confirmed by Nanda Quinton [phone]) on 07/16/2019 9:28:26 PM      ____________________________________________  RADIOLOGY  CT Angio Chest PE W and/or Wo Contrast  Result Date: 07/16/2019 CLINICAL DATA:  Shortness of breath. EXAM: CT ANGIOGRAPHY CHEST WITH CONTRAST TECHNIQUE: Multidetector CT imaging of the chest was performed using the standard protocol during bolus administration of intravenous contrast. Multiplanar CT image reconstructions and MIPs were obtained to evaluate the vascular anatomy. CONTRAST:  169mL OMNIPAQUE IOHEXOL 350 MG/ML SOLN COMPARISON:  Nov 03, 2017. FINDINGS: Cardiovascular: Satisfactory opacification of the pulmonary arteries to the segmental level. No evidence of pulmonary embolism. Normal heart size. No pericardial effusion. Atherosclerosis of thoracic aorta is noted without aneurysm or dissection. Mediastinum/Nodes: No enlarged mediastinal, hilar, or axillary lymph nodes. Thyroid gland,  trachea, and esophagus demonstrate no significant findings. Lungs/Pleura: No pneumothorax or pleural effusion is noted. Multiple ground-glass opacities are noted throughout both lungs consistent with multifocal pneumonia. Upper Abdomen: No acute abnormality. Musculoskeletal: No chest wall abnormality. No acute or significant osseous findings. Review of the MIP images confirms the above findings. IMPRESSION: No definite evidence of pulmonary embolus. Multiple ground-glass opacities are noted throughout both lungs consistent with multifocal pneumonia. Aortic Atherosclerosis (ICD10-I70.0). Electronically Signed   By: Marijo Conception M.D.   On: 07/16/2019 21:20   DG Chest Portable 1 View  Result Date: 07/16/2019 CLINICAL DATA:  Fever, cough, shortness of breath EXAM: PORTABLE CHEST 1 VIEW COMPARISON:  09/12/2018 FINDINGS: Patchy bilateral airspace opacities throughout both lungs concerning for pneumonia. Heart is borderline in size. No effusions or pneumothorax. No acute bony abnormality. IMPRESSION: Patchy bilateral airspace disease compatible with pneumonia. Electronically Signed   By: Rolm Baptise M.D.   On: 07/16/2019 18:56    ____________________________________________   PROCEDURES  Procedure(s) performed:   Procedures  CRITICAL CARE Performed by: Margette Fast Total critical care time: 35 minutes Critical care time was exclusive of separately billable procedures and treating other patients. Critical care was necessary to treat or prevent imminent or life-threatening deterioration. Critical care was time spent personally by me on the following activities: development of treatment plan with patient and/or surrogate as well as nursing, discussions with consultants, evaluation of patient's response to treatment, examination of patient, obtaining history from patient or surrogate, ordering and performing treatments and interventions, ordering and review of laboratory studies, ordering and review  of radiographic studies, pulse oximetry and re-evaluation of patient's condition.  Nanda Quinton, MD Emergency Medicine  ____________________________________________   INITIAL IMPRESSION / ASSESSMENT AND PLAN / ED COURSE  Pertinent labs & imaging results that were available during my care of the patient were reviewed by me and considered in my medical decision making (see chart for details).   Patient arrives to the emergency department with flulike symptoms and shortness of breath.  She has significant hypoxemia on arrival but is not having severe increased work of breathing and does not appear to be in acute respiratory distress.  She is improved with nasal cannula oxygen at this time.  PE remains in the differential but her symptoms are more infectious in etiology.  Her rapid Covid test is negative.  Her husband is admitted with multifocal pneumonia and I do continue to suspect COVID-19 clinically.  We will send PCR testing as well as flu swabs.  Considered ACS.  Patient's EKG shows nonspecific ST changes both inferior and lateral.  These do not  meet STEMI criteria and baseline wander does appear to be a factor.  ST changes may be rate related.  We will continue to follow but low suspicion for ACS as a primary etiology to explain her symptoms.   Troponin is elevated at 109 but suspect this is related to demand type ischemic change.  Her EKG is normal on repeat with improved baseline.  CTA of the chest shows no PE but infiltrate consistent with her COVID-19 diagnosis here on PCR.  Inflammatory markers are elevated.  I reassessed the patient and she continues to be hypoxemic with mild tachypnea but is able to carry on a conversation and is in no acute distress.  She does not require additional airway management at this time.  Patient advises that she is a FULL CODE and would want trial of intubation if symptoms worsen. Plan for admit. Decadron and Remdesivir ordered.   Discussed patient's case with  [facility] to request admission. Patient and family (if present) updated with plan. Care transferred to [facility] service.  I reviewed all nursing notes, vitals, pertinent old records, EKGs, labs, imaging (as available).    OTHELIA MI was evaluated in Emergency Department on 07/16/2019 for the symptoms described in the history of present illness. She was evaluated in the context of the global COVID-19 pandemic, which necessitated consideration that the patient might be at risk for infection with the SARS-CoV-2 virus that causes COVID-19. Institutional protocols and algorithms that pertain to the evaluation of patients at risk for COVID-19 are in a state of rapid change based on information released by regulatory bodies including the CDC and federal and state organizations. These policies and algorithms were followed during the patient's care in the ED.  ____________________________________________  FINAL CLINICAL IMPRESSION(S) / ED DIAGNOSES  Final diagnoses:  Acute respiratory failure with hypoxia (HCC)  COVID-19  Precordial chest pain    MEDICATIONS GIVEN DURING THIS VISIT:  Medications  azithromycin (ZITHROMAX) 500 mg in sodium chloride 0.9 % 250 mL IVPB (0 mg Intravenous Stopped 07/16/19 2145)  remdesivir 200 mg in sodium chloride 0.9% 250 mL IVPB (200 mg Intravenous New Bag/Given 07/16/19 2246)  remdesivir 100 mg in sodium chloride 0.9 % 100 mL IVPB (has no administration in time range)  cefTRIAXone (ROCEPHIN) 1 g in sodium chloride 0.9 % 100 mL IVPB (0 g Intravenous Stopped 07/16/19 2110)  iohexol (OMNIPAQUE) 350 MG/ML injection 100 mL (100 mLs Intravenous Contrast Given 07/16/19 2052)  dexamethasone (DECADRON) injection 10 mg (10 mg Intravenous Given 07/16/19 2145)    Note:  This document was prepared using Dragon voice recognition software and may include unintentional dictation errors.  Nanda Quinton, MD, [facility] Emergency Medicine    Shalisa Mcquade, [facility], MD 07/16/19 [phone]

## 2019-07-16 NOTE — ED Triage Notes (Signed)
Pt reports cough, fever, sob, and headache since new year's eve.  Reports husband here with similar symptoms.

## 2019-07-16 NOTE — ED Notes (Signed)
Pt reports temp 101 pta and ems gave 1g of tylenol pta.

## 2019-07-16 NOTE — ED Notes (Signed)
Date and time results received: 07/16/19 2123 (use smartphrase ".now" to insert current time)  Test: Covid PCR Critical Value: positive  Name of Provider Notified: Dr Laverta Baltimore  Orders Received? Or Actions Taken?:

## 2019-07-17 ENCOUNTER — Inpatient Hospital Stay (HOSPITAL_COMMUNITY): Payer: Medicare Other

## 2019-07-17 ENCOUNTER — Encounter (HOSPITAL_COMMUNITY): Payer: Self-pay | Admitting: Family Medicine

## 2019-07-17 ENCOUNTER — Other Ambulatory Visit (HOSPITAL_COMMUNITY): Payer: Medicare Other

## 2019-07-17 DIAGNOSIS — E871 Hypo-osmolality and hyponatremia: Secondary | ICD-10-CM | POA: Diagnosis present

## 2019-07-17 DIAGNOSIS — Z7901 Long term (current) use of anticoagulants: Secondary | ICD-10-CM | POA: Diagnosis not present

## 2019-07-17 DIAGNOSIS — R7989 Other specified abnormal findings of blood chemistry: Secondary | ICD-10-CM | POA: Insufficient documentation

## 2019-07-17 DIAGNOSIS — I501 Left ventricular failure: Secondary | ICD-10-CM | POA: Diagnosis present

## 2019-07-17 DIAGNOSIS — I21A1 Myocardial infarction type 2: Secondary | ICD-10-CM | POA: Diagnosis present

## 2019-07-17 DIAGNOSIS — J1282 Pneumonia due to coronavirus disease 2019: Secondary | ICD-10-CM | POA: Diagnosis present

## 2019-07-17 DIAGNOSIS — I700 Atherosclerosis of aorta: Secondary | ICD-10-CM | POA: Diagnosis present

## 2019-07-17 DIAGNOSIS — E875 Hyperkalemia: Secondary | ICD-10-CM | POA: Diagnosis present

## 2019-07-17 DIAGNOSIS — Z888 Allergy status to other drugs, medicaments and biological substances status: Secondary | ICD-10-CM | POA: Diagnosis not present

## 2019-07-17 DIAGNOSIS — Z885 Allergy status to narcotic agent status: Secondary | ICD-10-CM | POA: Diagnosis not present

## 2019-07-17 DIAGNOSIS — U071 COVID-19: Secondary | ICD-10-CM | POA: Diagnosis present

## 2019-07-17 DIAGNOSIS — E785 Hyperlipidemia, unspecified: Secondary | ICD-10-CM | POA: Diagnosis present

## 2019-07-17 DIAGNOSIS — R072 Precordial pain: Secondary | ICD-10-CM | POA: Diagnosis present

## 2019-07-17 DIAGNOSIS — J440 Chronic obstructive pulmonary disease with acute lower respiratory infection: Secondary | ICD-10-CM | POA: Diagnosis present

## 2019-07-17 DIAGNOSIS — R0602 Shortness of breath: Secondary | ICD-10-CM

## 2019-07-17 DIAGNOSIS — R778 Other specified abnormalities of plasma proteins: Secondary | ICD-10-CM | POA: Insufficient documentation

## 2019-07-17 DIAGNOSIS — Z881 Allergy status to other antibiotic agents status: Secondary | ICD-10-CM | POA: Diagnosis not present

## 2019-07-17 DIAGNOSIS — J449 Chronic obstructive pulmonary disease, unspecified: Secondary | ICD-10-CM | POA: Diagnosis not present

## 2019-07-17 DIAGNOSIS — M810 Age-related osteoporosis without current pathological fracture: Secondary | ICD-10-CM | POA: Diagnosis present

## 2019-07-17 DIAGNOSIS — J9601 Acute respiratory failure with hypoxia: Secondary | ICD-10-CM | POA: Diagnosis present

## 2019-07-17 DIAGNOSIS — Z7982 Long term (current) use of aspirin: Secondary | ICD-10-CM | POA: Diagnosis not present

## 2019-07-17 DIAGNOSIS — Z87891 Personal history of nicotine dependence: Secondary | ICD-10-CM | POA: Diagnosis not present

## 2019-07-17 DIAGNOSIS — Z823 Family history of stroke: Secondary | ICD-10-CM | POA: Diagnosis not present

## 2019-07-17 LAB — HEPARIN LEVEL (UNFRACTIONATED): Heparin Unfractionated: <0.1 IU/mL — ABNORMAL LOW (ref 0.30–0.70)

## 2019-07-17 LAB — ABO/RH: ABO/RH(D): O POS

## 2019-07-17 LAB — ECHOCARDIOGRAM LIMITED
Height: 64 in
Weight: 3408 oz

## 2019-07-17 LAB — TROPONIN I (HIGH SENSITIVITY)
Troponin I (High Sensitivity): 1961 ng/L (ref <18)
Troponin I (High Sensitivity): 1888 ng/L (ref <18)

## 2019-07-17 MED ORDER — ACETAMINOPHEN 325 MG PO TABS: 650.0000 mg | ORAL_TABLET | Freq: Four times a day (QID) | ORAL | Status: DC | PRN

## 2019-07-17 MED ORDER — TOCILIZUMAB 400 MG/20ML IV SOLN
8.0000 mg/kg | Freq: Once | INTRAVENOUS | Status: DC
  Filled 2019-07-17: qty 38.6

## 2019-07-17 MED ORDER — POLYETHYLENE GLYCOL 3350 17 G PO PACK: 17.0000 g | PACK | Freq: Every day | ORAL | Status: DC | PRN

## 2019-07-17 MED ORDER — FLUTICASONE FUROATE-VILANTEROL 200-25 MCG/INH IN AEPB
1.0000 | INHALATION_SPRAY | Freq: Every day | RESPIRATORY_TRACT | Status: DC
  Administered 2019-07-18 – 2019-07-22 (×5): 1 via RESPIRATORY_TRACT
  Filled 2019-07-17: qty 28

## 2019-07-17 MED ORDER — SODIUM CHLORIDE 0.9% FLUSH
3.0000 mL | Freq: Two times a day (BID) | INTRAVENOUS | Status: DC
  Administered 2019-07-17 – 2019-07-22 (×10): 3 mL via INTRAVENOUS

## 2019-07-17 MED ORDER — DEXAMETHASONE SODIUM PHOSPHATE 10 MG/ML IJ SOLN
6.0000 mg | INTRAMUSCULAR | Status: DC
  Administered 2019-07-17 – 2019-07-22 (×6): 6 mg via INTRAVENOUS
  Filled 2019-07-17 (×6): qty 1

## 2019-07-17 MED ORDER — FLUTICASONE-UMECLIDIN-VILANT 100-62.5-25 MCG/INH IN AEPB: 1.0000 | INHALATION_SPRAY | Freq: Every day | RESPIRATORY_TRACT | Status: DC

## 2019-07-17 MED ORDER — ONDANSETRON HCL 4 MG/2ML IJ SOLN: 4.0000 mg | Freq: Four times a day (QID) | INTRAMUSCULAR | Status: DC | PRN

## 2019-07-17 MED ORDER — HEPARIN BOLUS VIA INFUSION
2000.0000 [IU] | Freq: Once | INTRAVENOUS | Status: AC
  Administered 2019-07-17: 2000 [IU] via INTRAVENOUS
  Filled 2019-07-17: qty 2000

## 2019-07-17 MED ORDER — ONDANSETRON HCL 4 MG PO TABS: 4.0000 mg | ORAL_TABLET | Freq: Four times a day (QID) | ORAL | Status: DC | PRN

## 2019-07-17 MED ORDER — HEPARIN (PORCINE) 25000 UT/250ML-% IV SOLN
950.0000 [IU]/h | INTRAVENOUS | Status: DC
  Administered 2019-07-17: 950 [IU]/h via INTRAVENOUS
  Filled 2019-07-17: qty 250

## 2019-07-17 MED ORDER — ALBUTEROL SULFATE HFA 108 (90 BASE) MCG/ACT IN AERS
2.0000 | INHALATION_SPRAY | Freq: Four times a day (QID) | RESPIRATORY_TRACT | Status: DC | PRN
  Filled 2019-07-17: qty 6.7

## 2019-07-17 MED ORDER — UMECLIDINIUM BROMIDE 62.5 MCG/INH IN AEPB
1.0000 | INHALATION_SPRAY | Freq: Every day | RESPIRATORY_TRACT | Status: DC
  Administered 2019-07-18 – 2019-07-22 (×5): 1 via RESPIRATORY_TRACT
  Filled 2019-07-17: qty 7

## 2019-07-17 MED ORDER — ASPIRIN EC 81 MG PO TBEC
81.0000 mg | DELAYED_RELEASE_TABLET | Freq: Every day | ORAL | Status: DC
  Administered 2019-07-17 – 2019-07-22 (×6): 81 mg via ORAL
  Filled 2019-07-17 (×6): qty 1

## 2019-07-17 MED ORDER — HEPARIN BOLUS VIA INFUSION
4000.0000 [IU] | Freq: Once | INTRAVENOUS | Status: AC
  Administered 2019-07-17: 4000 [IU] via INTRAVENOUS

## 2019-07-17 MED ORDER — HEPARIN (PORCINE) 25000 UT/250ML-% IV SOLN
1700.0000 [IU]/h | INTRAVENOUS | Status: DC
  Administered 2019-07-17 (×2): 1200 [IU]/h via INTRAVENOUS
  Administered 2019-07-18: 1700 [IU]/h via INTRAVENOUS
  Filled 2019-07-17 (×2): qty 250

## 2019-07-17 MED ORDER — HYDROCODONE-ACETAMINOPHEN 5-325 MG PO TABS: 1.0000 | ORAL_TABLET | ORAL | Status: DC | PRN

## 2019-07-17 NOTE — ED Notes (Signed)
Pharmacy is bringing down Actemra

## 2019-07-17 NOTE — Progress Notes (Signed)
  Echocardiogram 2D Echocardiogram has been performed.  Jannett Celestine 07/17/2019, 4:42 PM

## 2019-07-17 NOTE — ED Notes (Signed)
Date and time results received: 07/17/19 0214 (use smartphrase ".now" to insert current time)  Test: Troponin Critical Value: 1961  Name of Provider Notified: Dr. Myna Hidalgo via Shea Evans  Orders Received? Or Actions Taken?:

## 2019-07-17 NOTE — H&P (Addendum)
History and Physical    Jessica Lewis [id number] DOB: Jul 15, 1945 DOA: 07/16/2019  PCP: Patient, No Pcp Per   Patient coming from: Home   Chief Complaint: SOB, cough, fevers   HPI: Jessica Lewis is a 74 y.o. female with medical history significant for COPD, who presented to the emergency department with 2 weeks of progressive shortness of breath, cough, fevers, and malaise.  Patient reports that she and her husband both developed symptoms approximately 2 weeks ago and have progressively worsened.  She does not normally require supplemental oxygen.  Albuterol has helped some, but affects are fleeting.  She has some mild chest discomfort with coughing.  She denies lower extremity swelling or tenderness.  She denies abdominal pain.  EMS was called, patient was found to be febrile, and treated with 1 g of acetaminophen prior to arrival in the ED.  ED Course: Upon arrival to the ED, patient is found to be afebrile, saturating 75% on room air, tachypneic in the low 30s, tachycardic in the 110s, and with stable blood pressure.  EKG features sinus tachycardia with rate 112 and inferolateral ST abnormality.  Chest x-ray with patchy bilateral airspace disease.  CTA chest without definite PE but notable for multifocal pneumonia.  Chemistry panel with mild hyponatremia, albumin 2.4, and AST 49.  CBC with leukocytosis to 15,000.  Troponin elevated to 109.  Covid PCR is positive.  D-dimer elevated to 11.84.  Blood cultures were collected in the ED and the patient was treated with Rocephin, azithromycin, remdesivir, and Decadron.  She is requiring 5 L/min of supplemental oxygen and has O2 saturation around 90%.  Hospitalists are consulted for admission.  Review of Systems:  All other systems reviewed and apart from HPI, are negative.  Past Medical History:  Diagnosis Date  . Acute bronchitis   . Age-related osteoporosis without current pathological fracture   . Allergic rhinitis due to pollen   .  Bronchitis   . Chronic obstructive pulmonary disease, unspecified ([place])   . Cystocele 10/14/2015  . Headache(784.0)    menstral migraines  . Influenza due to unidentified influenza virus with other respiratory manifestations   . Obesity   . Obesity, unspecified   . Osteoporosis   . Osteoporosis, unspecified   . Other chest pain   . Rectocele 11/16/2012  . Rosacea   . Shortness of breath     Past Surgical History:  Procedure Laterality Date  . CHOLECYSTECTOMY    . COLONOSCOPY  02/09/2011   Procedure: COLONOSCOPY;  Surgeon: Rogene Houston, MD;  Location: [facility];  Service: Endoscopy;  Laterality: N/A;  . TUBAL LIGATION Bilateral      reports that she has quit smoking. Her smoking use included cigarettes. She has never used smokeless tobacco. She reports that she does not drink alcohol or use drugs.  Allergies  Allergen Reactions  . Codeine Nausea And Vomiting  . Tetracyclines & Related Rash  . Claritin [Loratadine] Hives    Family History  Problem Relation Age of Onset  . Stroke Mother      Prior to Admission medications   Medication Sig Start Date End Date Taking? Authorizing Provider  albuterol (VENTOLIN HFA) 108 (90 Base) MCG/ACT inhaler Inhale 2 puffs into the lungs every 6 (six) hours as needed for wheezing or shortness of breath.   Yes [provider]  Ascorbic Acid (VITAMIN C) 500 MG CHEW Chew 1 tablet by mouth daily.   Yes [provider]  calcium carbonate (OS-CAL -  DOSED IN MG OF ELEMENTAL CALCIUM) 1250 (500 Ca) MG tablet Take 1,000 mg by mouth daily.    Yes [provider]  denosumab (PROLIA) 60 MG/ML SOLN injection Inject 60 mg into the skin every 6 (six) months. Administer in upper arm, thigh, or abdomen   Yes [provider]  Fluticasone-Umeclidin-Vilant (TRELEGY ELLIPTA) 100-62.5-25 MCG/INH AEPB Inhale 1 puff into the lungs daily.   Yes [provider]  Multiple Vitamins-Minerals (ONE-A-DAY WOMENS PETITES PO)  Take by mouth daily.   Yes [provider]  Vitamin D, Ergocalciferol, (DRISDOL) 1.25 MG (50000 UNIT) CAPS capsule Take 50,000 Units by mouth every Tuesday.  05/10/19  Yes [provider]  zinc gluconate 50 MG tablet Take 50 mg by mouth daily.   Yes [provider]    Physical Exam: Vitals:   07/17/19 0100 07/17/19 0115 07/17/19 0130 07/17/19 0145  BP: 136/69     Pulse: 91 88 97 92  Resp: (!) 26 (!) 27 (!) 25 (!) 26  Temp:      TempSrc:      SpO2: 91% 90% 91% (!) 89%  Weight:      Height:         Constitutional: NAD, calm  Eyes: PERTLA, lids and conjunctivae normal ENMT: Mucous membranes are moist. Posterior pharynx clear of any exudate or lesions.   Neck: normal, supple, no masses, no thyromegaly Respiratory: Tachypneic. Scattered rhonchi. No pallor or cyanosis.  Cardiovascular: S1 & S2 heard, regular rate and rhythm. No extremity edema.   Abdomen: No distension, no tenderness, soft. Bowel sounds active.  Musculoskeletal: no clubbing / cyanosis. No joint deformity upper and lower extremities.  Skin: no significant rashes, lesions, ulcers. Warm, dry, well-perfused. Neurologic: No facial asymmetry. Sensation intact. Moving all extremities.  Psychiatric: Pleasant, cooperative. Alert and answering questions appropriately.    Labs on Admission: I have personally reviewed following labs and imaging studies  CBC: Recent Labs  Lab 07/16/19 1854  WBC 15.0*  NEUTROABS 11.8*  HGB 12.3  HCT 37.0  MCV 88.1  PLT 123456   Basic Metabolic Panel: Recent Labs  Lab 07/16/19 1854  NA 134*  K 3.6  CL 100  CO2 25  GLUCOSE 131*  BUN 20  CREATININE 0.72  CALCIUM 8.1*   GFR: Estimated Creatinine Clearance: 71.7 mL/min (by C-G formula based on SCr of 0.72 mg/dL). Liver Function Tests: Recent Labs  Lab 07/16/19 1854  AST 49*  ALT 43  ALKPHOS 129*  BILITOT 0.6  PROT 6.8  ALBUMIN 2.4*   No results for input(s): LIPASE, AMYLASE in the last 168  hours. No results for input(s): AMMONIA in the last 168 hours. Coagulation Profile: No results for input(s): INR, PROTIME in the last 168 hours. Cardiac Enzymes: No results for input(s): CKTOTAL, CKMB, CKMBINDEX, TROPONINI in the last 168 hours. BNP (last 3 results) No results for input(s): PROBNP in the last 8760 hours. HbA1C: No results for input(s): HGBA1C in the last 72 hours. CBG: No results for input(s): GLUCAP in the last 168 hours. Lipid Profile: Recent Labs    07/16/19 1855  TRIG 180*   Thyroid Function Tests: No results for input(s): TSH, T4TOTAL, FREET4, T3FREE, THYROIDAB in the last 72 hours. Anemia Panel: Recent Labs    07/16/19 1855  FERRITIN 737*   Urine analysis: No results found for: COLORURINE, APPEARANCEUR, LABSPEC, PHURINE, GLUCOSEU, HGBUR, BILIRUBINUR, KETONESUR, PROTEINUR, UROBILINOGEN, NITRITE, LEUKOCYTESUR Sepsis Labs: @LABRCNTIP (procalcitonin:4,lacticidven:4) ) Recent Results (from the past 240 hour(s))  Respiratory Panel by RT  PCR (Flu A&B, Covid) - Nasopharyngeal Swab     Status: Abnormal   Collection Time: 07/16/19  6:42 PM   Specimen: Nasopharyngeal Swab  Result Value Ref Range Status   SARS Coronavirus 2 by RT PCR POSITIVE (A) NEGATIVE Final    Comment: RESULT CALLED TO, READ BACK BY AND VERIFIED WITH: T EASTER,RN @2123  07/16/19 MKELLY (NOTE) SARS-CoV-2 target nucleic acids are DETECTED. SARS-CoV-2 RNA is generally detectable in upper respiratory specimens  during the acute phase of infection. Positive results are indicative of the presence of the identified virus, but do not rule out bacterial infection or co-infection with other pathogens not detected by the test. Clinical correlation with patient history and other diagnostic information is necessary to determine patient infection status. The expected result is Negative. Fact Sheet for Patients:  PinkCheek.be Fact Sheet for Healthcare  Providers: GravelBags.it This test is not yet approved or cleared by the [place] FDA and  has been authorized for detection and/or diagnosis of SARS-CoV-2 by FDA under an Emergency Use Authorization (EUA).  This EUA will remain in effect (meaning this test can be used) for  the duration of  the COVID-19 declaration under Section 564(b)(1) of the Act, 21 U.S.C. section 360bbb-3(b)(1), unless the authorization is terminated or revoked sooner.    Influenza A by PCR NEGATIVE NEGATIVE Final   Influenza B by PCR NEGATIVE NEGATIVE Final    Comment: (NOTE) The Xpert Xpress SARS-CoV-2/FLU/RSV assay is intended as an aid in  the diagnosis of influenza from Nasopharyngeal swab specimens and  should not be used as a sole basis for treatment. Nasal washings and  aspirates are unacceptable for Xpert Xpress SARS-CoV-2/FLU/RSV  testing. Fact Sheet for Patients: PinkCheek.be Fact Sheet for Healthcare Providers: GravelBags.it This test is not yet approved or cleared by the [place] FDA and  has been authorized for detection and/or diagnosis of SARS-CoV-2 by  FDA under an Emergency Use Authorization (EUA). This EUA will remain  in effect (meaning this test can be used) for the duration of the  Covid-19 declaration under Section 564(b)(1) of the Act, 21  U.S.C. section 360bbb-3(b)(1), unless the authorization is  terminated or revoked. Performed at [facility], [address] [place], [place] [address]   Blood Culture (routine x 2)     Status: None (Preliminary result)   Collection Time: 07/16/19  6:55 PM   Specimen: BLOOD RIGHT HAND  Result Value Ref Range Status   Specimen Description BLOOD RIGHT HAND  Final   Special Requests   Final    BOTTLES DRAWN AEROBIC AND ANAEROBIC Blood Culture adequate volume Performed at [facility], [address] [place], [place] [address]    Culture PENDING   Incomplete   Report Status PENDING  Incomplete     Radiological Exams on Admission: CT Angio Chest PE W and/or Wo Contrast  Result Date: 07/16/2019 CLINICAL DATA:  Shortness of breath. EXAM: CT ANGIOGRAPHY CHEST WITH CONTRAST TECHNIQUE: Multidetector CT imaging of the chest was performed using the standard protocol during bolus administration of intravenous contrast. Multiplanar CT image reconstructions and MIPs were obtained to evaluate the vascular anatomy. CONTRAST:  19mL OMNIPAQUE IOHEXOL 350 MG/ML SOLN COMPARISON:  Nov 03, 2017. FINDINGS: Cardiovascular: Satisfactory opacification of the pulmonary arteries to the segmental level. No evidence of pulmonary embolism. Normal heart size. No pericardial effusion. Atherosclerosis of thoracic aorta is noted without aneurysm or dissection. Mediastinum/Nodes: No enlarged mediastinal, hilar, or axillary lymph nodes. Thyroid gland, trachea, and esophagus demonstrate no significant findings.  Lungs/Pleura: No pneumothorax or pleural effusion is noted. Multiple ground-glass opacities are noted throughout both lungs consistent with multifocal pneumonia. Upper Abdomen: No acute abnormality. Musculoskeletal: No chest wall abnormality. No acute or significant osseous findings. Review of the MIP images confirms the above findings. IMPRESSION: No definite evidence of pulmonary embolus. Multiple ground-glass opacities are noted throughout both lungs consistent with multifocal pneumonia. Aortic Atherosclerosis (ICD10-I70.0). Electronically Signed   By: Marijo Conception M.D.   On: 07/16/2019 21:20   DG Chest Portable 1 View  Result Date: 07/16/2019 CLINICAL DATA:  Fever, cough, shortness of breath EXAM: PORTABLE CHEST 1 VIEW COMPARISON:  09/12/2018 FINDINGS: Patchy bilateral airspace opacities throughout both lungs concerning for pneumonia. Heart is borderline in size. No effusions or pneumothorax. No acute bony abnormality. IMPRESSION: Patchy bilateral airspace disease  compatible with pneumonia. Electronically Signed   By: Rolm Baptise M.D.   On: 07/16/2019 18:56    EKG: Independently reviewed. Sinus tachycardia (rate 112), inferolateral ST abnormality.   Assessment/Plan   1. COVID-19 pneumonia; acute hypoxic respiratory failure  - Presents with fevers, SOB, and cough, worsening over the past 2 weeks, found to have COVID-19 with multifocal PNA on imaging, procalcitonin 0.20, d-dimer 11.84 without definite PE on CTA, CRP 37.8  - She was started on supplemental O2 in ED, requiring 5 Lpm  - She was started on remdesivir and Decadrdon in ED  - Continue supplemental O2, remdesivir, and Decadron, start Actemra, start intermediate dose Lovenox, trend markers    2. Elevated troponin  - Troponin 109 in ED without angina  - Likely reflects demand ischemia in setting of COVID pna with respiratory failure  - Continue cardiac monitoring, check delta troponin    ADDENDUM:  Second troponin elevated further to 1961. [place] cardiology fellow reviewing case and providing recommendations. Patient appears comfortable now on supplemental O2, is not having angina, and while cardiology recommends stress test after COVID-19 infection has resolved, there would not be any immediate intervention or need to go to [facility] rather than [facility]. Cards fellow was okay with starting anticoagulation, felt reasonable to check echo if available at [facility], but did not see need to trend troponin.    3. COPD  - No supplemental O2 requirement at baseline  - Continue inhalers     DVT prophylaxis: Lovenox  Code Status: Full, confirmed with patient  Family Communication: Discussed with patient  Consults called: None  Admission status: Inpatient     Vianne Bulls, MD [facility] Pager [phone]  If 7PM-7AM, please contact night-coverage [email or web address] Password [email or web address]  07/17/2019, 2:12 AM

## 2019-07-17 NOTE — Progress Notes (Signed)
PROGRESS NOTE  Jessica Lewis [id number] DOB: [birth year] DOA: 07/16/2019 PCP: Patient, No Pcp Per  Brief History:  74 y.o. female with medical history significant for COPD, who presented to the emergency department with 2 weeks of progressive shortness of breath, cough, fevers, and malaise.    Patient states that she began having fevers, chills, and coughing on 07/04/2019.  Patient reports that she and her husband both developed symptoms approximately 2 weeks ago and have progressively worsened.  She does not normally require supplemental oxygen.  Albuterol has helped some, but affects are fleeting.  She has some mild chest discomfort with coughing.    She denies any hemoptysis, vomiting, diarrhea, hematochezia, melena, dysuria.  She denies lower extremity swelling or tenderness.  She denies abdominal pain.  EMS was called, patient was found to be febrile, and treated with 1 g of acetaminophen prior to arrival in the ED.  ED Course: Upon arrival to the ED, patient is found to be afebrile, saturating 75% on room air, tachypneic in the low 30s, tachycardic in the 110s, and with stable blood pressure.  EKG features sinus tachycardia with rate 112 and inferolateral ST abnormality.  Chest x-ray with patchy bilateral airspace disease.  CTA chest without definite PE but notable for multifocal pneumonia.  Chemistry panel with mild hyponatremia, albumin 2.4, and AST 49.  CBC with leukocytosis to 15,000.  Troponin elevated to 109.  Covid PCR is positive.  D-dimer elevated to 11.84.  Blood cultures were collected in the ED and the patient was treated with Rocephin, azithromycin, remdesivir, and Decadron.  She is requiring 5 L/min of supplemental oxygen and has O2 saturation around 90%.  Hospitalists are consulted for admission.  Assessment/Plan:  Acute respiratory failure with hypoxia -Secondary to COVID-19 pneumonia -stable on 6LNC presently with saturation 92-93% -CRP 37.8 -D-dimer  11.4 -Ferritin 737 -PCT 0.20 -CTA chest--negative PE, multiple bilateral groundglass opacities -Continue remdesivir and IV dexamethasone -Give dose Actemra -start combivent  Elevated troponin -Troponin increased to 109>>>1961 -Dr. Myna Hidalgo spoke with cardiology fellow>>>ok to transfer to [facility] -I have asked cardiology to see patient this am at [facility] to clarify dispo -?myocarditis vs ACS vs demand ischemia -continue IV heparin pending cardiology eval -Echo  COPD -no oxygen at home      Disposition Plan:   Transfer pending cardiology eval  Family Communication:   No Family at bedside  Consultants:  cardiology  Code Status:  FULL  DVT Prophylaxis:  IV Heparin   Procedures: As Listed in Progress Note Above  Antibiotics: None  RN Pressure Injury Documentation:        Subjective: Pt feels that she has been slightly better but still remains short of breath with any type of exertion.  She complains of a nonproductive cough.  She denies any headache, nausea, vomit, diarrhea, abdominal pain.  There is no visual disturbance.  There is no hematochezia or melena.  There is no dysuria.  Objective: Vitals:   07/17/19 0648 07/17/19 0700 07/17/19 0800 07/17/19 0832  BP:  (!) 137/56 (!) 141/55   Pulse:  87 99   Resp:  (!) 24 (!) 27   Temp: 98.5 F (36.9 C)     TempSrc: Oral     SpO2:  91% 93% 91%  Weight:      Height:       No intake or output data in the 24 hours ending 07/17/19 0845 Weight change:  Exam:   General:  Pt is alert, follows commands appropriately, not in acute distress  HEENT: No icterus, No thrush, No neck mass, [place]/AT  Cardiovascular: RRR, S1/S2, no rubs, no gallops  Respiratory: Bibasilar rales.  No wheezing.  Good air movement.  Abdomen: Soft/+BS, non tender, non distended, no guarding  Extremities: No edema, No lymphangitis, No petechiae, No rashes, no synovitis   Data Reviewed: I have personally reviewed following labs and imaging  studies Basic Metabolic Panel: Recent Labs  Lab 07/16/19 1854  NA 134*  K 3.6  CL 100  CO2 25  GLUCOSE 131*  BUN 20  CREATININE 0.72  CALCIUM 8.1*   Liver Function Tests: Recent Labs  Lab 07/16/19 1854  AST 49*  ALT 43  ALKPHOS 129*  BILITOT 0.6  PROT 6.8  ALBUMIN 2.4*   No results for input(s): LIPASE, AMYLASE in the last 168 hours. No results for input(s): AMMONIA in the last 168 hours. Coagulation Profile: No results for input(s): INR, PROTIME in the last 168 hours. CBC: Recent Labs  Lab 07/16/19 1854  WBC 15.0*  NEUTROABS 11.8*  HGB 12.3  HCT 37.0  MCV 88.1  PLT 398   Cardiac Enzymes: No results for input(s): CKTOTAL, CKMB, CKMBINDEX, TROPONINI in the last 168 hours. BNP: Invalid input(s): POCBNP CBG: No results for input(s): GLUCAP in the last 168 hours. HbA1C: No results for input(s): HGBA1C in the last 72 hours. Urine analysis: No results found for: COLORURINE, APPEARANCEUR, LABSPEC, PHURINE, GLUCOSEU, HGBUR, BILIRUBINUR, KETONESUR, PROTEINUR, UROBILINOGEN, NITRITE, LEUKOCYTESUR Sepsis Labs: @LABRCNTIP (procalcitonin:4,lacticidven:4) ) Recent Results (from the past 240 hour(s))  Respiratory Panel by RT PCR (Flu A&B, Covid) - Nasopharyngeal Swab     Status: Abnormal   Collection Time: 07/16/19  6:42 PM   Specimen: Nasopharyngeal Swab  Result Value Ref Range Status   SARS Coronavirus 2 by RT PCR POSITIVE (A) NEGATIVE Final    Comment: RESULT CALLED TO, READ BACK BY AND VERIFIED WITH: T EASTER,RN @2123  07/16/19 MKELLY (NOTE) SARS-CoV-2 target nucleic acids are DETECTED. SARS-CoV-2 RNA is generally detectable in upper respiratory specimens  during the acute phase of infection. Positive results are indicative of the presence of the identified virus, but do not rule out bacterial infection or co-infection with other pathogens not detected by the test. Clinical correlation with patient history and other diagnostic information is necessary to  determine patient infection status. The expected result is Negative. Fact Sheet for Patients:  PinkCheek.be Fact Sheet for Healthcare Providers: GravelBags.it This test is not yet approved or cleared by the [place] FDA and  has been authorized for detection and/or diagnosis of SARS-CoV-2 by FDA under an Emergency Use Authorization (EUA).  This EUA will remain in effect (meaning this test can be used) for  the duration of  the COVID-19 declaration under Section 564(b)(1) of the Act, 21 U.S.C. section 360bbb-3(b)(1), unless the authorization is terminated or revoked sooner.    Influenza A by PCR NEGATIVE NEGATIVE Final   Influenza B by PCR NEGATIVE NEGATIVE Final    Comment: (NOTE) The Xpert Xpress SARS-CoV-2/FLU/RSV assay is intended as an aid in  the diagnosis of influenza from Nasopharyngeal swab specimens and  should not be used as a sole basis for treatment. Nasal washings and  aspirates are unacceptable for Xpert Xpress SARS-CoV-2/FLU/RSV  testing. Fact Sheet for Patients: PinkCheek.be Fact Sheet for Healthcare Providers: GravelBags.it This test is not yet approved or cleared by the [place] FDA and  has been authorized for detection and/or diagnosis of SARS-CoV-2 by  FDA  under an Emergency Use Authorization (EUA). This EUA will remain  in effect (meaning this test can be used) for the duration of the  Covid-19 declaration under Section 564(b)(1) of the Act, 21  U.S.C. section 360bbb-3(b)(1), unless the authorization is  terminated or revoked. Performed at [facility], [address] [place], [place] [address]   Blood Culture (routine x 2)     Status: None (Preliminary result)   Collection Time: 07/16/19  6:54 PM   Specimen: BLOOD  Result Value Ref Range Status   Specimen Description BLOOD RIGHT ANTECUBITAL  Final   Special Requests   Final     BOTTLES DRAWN AEROBIC AND ANAEROBIC Blood Culture adequate volume   Culture   Final    NO GROWTH < 24 HOURS Performed at [facility], [address], [place], [place] [address]    Report Status PENDING  Incomplete  Blood Culture (routine x 2)     Status: None (Preliminary result)   Collection Time: 07/16/19  6:55 PM   Specimen: BLOOD RIGHT HAND  Result Value Ref Range Status   Specimen Description BLOOD RIGHT HAND  Final   Special Requests   Final    BOTTLES DRAWN AEROBIC AND ANAEROBIC Blood Culture adequate volume   Culture   Final    NO GROWTH < 24 HOURS Performed at [facility], [address], [place], [place] [address]    Report Status PENDING  Incomplete     Scheduled Meds: . dexamethasone (DECADRON) injection  6 mg Intravenous Q24H  . fluticasone furoate-vilanterol  1 puff Inhalation Daily   And  . umeclidinium bromide  1 puff Inhalation Daily  . sodium chloride flush  3 mL Intravenous Q12H   Continuous Infusions: . heparin 950 Units/hr (07/17/19 0330)  . remdesivir 100 mg in NS 100 mL    . tocilizumab (ACTEMRA) - non-COVID treatment      Procedures/Studies: CT Angio Chest PE W and/or Wo Contrast  Result Date: 07/16/2019 CLINICAL DATA:  Shortness of breath. EXAM: CT ANGIOGRAPHY CHEST WITH CONTRAST TECHNIQUE: Multidetector CT imaging of the chest was performed using the standard protocol during bolus administration of intravenous contrast. Multiplanar CT image reconstructions and MIPs were obtained to evaluate the vascular anatomy. CONTRAST:  138mL OMNIPAQUE IOHEXOL 350 MG/ML SOLN COMPARISON:  Nov 03, 2017. FINDINGS: Cardiovascular: Satisfactory opacification of the pulmonary arteries to the segmental level. No evidence of pulmonary embolism. Normal heart size. No pericardial effusion. Atherosclerosis of thoracic aorta is noted without aneurysm or dissection. Mediastinum/Nodes: No enlarged mediastinal, hilar, or axillary lymph nodes. Thyroid gland, trachea, and esophagus  demonstrate no significant findings. Lungs/Pleura: No pneumothorax or pleural effusion is noted. Multiple ground-glass opacities are noted throughout both lungs consistent with multifocal pneumonia. Upper Abdomen: No acute abnormality. Musculoskeletal: No chest wall abnormality. No acute or significant osseous findings. Review of the MIP images confirms the above findings. IMPRESSION: No definite evidence of pulmonary embolus. Multiple ground-glass opacities are noted throughout both lungs consistent with multifocal pneumonia. Aortic Atherosclerosis (ICD10-I70.0). Electronically Signed   By: Marijo Conception M.D.   On: 07/16/2019 21:20   DG Chest Portable 1 View  Result Date: 07/16/2019 CLINICAL DATA:  Fever, cough, shortness of breath EXAM: PORTABLE CHEST 1 VIEW COMPARISON:  09/12/2018 FINDINGS: Patchy bilateral airspace opacities throughout both lungs concerning for pneumonia. Heart is borderline in size. No effusions or pneumothorax. No acute bony abnormality. IMPRESSION: Patchy bilateral airspace disease compatible with pneumonia. Electronically Signed   By: Rolm Baptise M.D.  On: 07/16/2019 18:56    Orson Eva, DO  [facility] Pager [phone]  If 7PM-7AM, please contact night-coverage [email or web address] Password [email or web address] 07/17/2019, 8:45 AM   LOS: 0 days

## 2019-07-17 NOTE — ED Notes (Signed)
Pt turned to left side, SpO2 86-88%. Increased supplemental O2 from 5L to 6L via [place].

## 2019-07-17 NOTE — Progress Notes (Signed)
ANTICOAGULATION CONSULT NOTE - Follow Up Consult  Pharmacy Consult for Heparin Indication: chest pain/ACS  Allergies  Allergen Reactions  . Codeine Nausea And Vomiting  . Tetracyclines & Related Rash  . Claritin [Loratadine] Hives    Patient Measurements: Height: 5\' 4"  (162.6 cm) Weight: 213 lb (96.6 kg) IBW/kg (Calculated) : 54.7 Heparin Dosing Weight: 76.8 kg  Vital Signs: Temp: 98 F (36.7 C) (01/13 1125) Temp Source: Oral (01/13 1125) BP: 143/48 (01/13 1125) Pulse Rate: 80 (01/13 1125)  Labs: Recent Labs    07/16/19 1854 07/17/19 0132 07/17/19 1415  HGB 12.3  --   --   HCT 37.0  --   --   PLT 398  --   --   HEPARINUNFRC  --   --  <0.10*  CREATININE 0.72  --   --   TROPONINIHS 109* 1,961*  --     Estimated Creatinine Clearance: 71.7 mL/min (by C-G formula based on SCr of 0.72 mg/dL).   Medications:  Scheduled:  . aspirin EC  81 mg Oral Daily  . dexamethasone (DECADRON) injection  6 mg Intravenous Q24H  . fluticasone furoate-vilanterol  1 puff Inhalation Daily   And  . umeclidinium bromide  1 puff Inhalation Daily  . sodium chloride flush  3 mL Intravenous Q12H   Infusions:  . heparin 950 Units/hr (07/17/19 0330)  . remdesivir 100 mg in NS 100 mL 100 mg (07/17/19 1017)  . tocilizumab (ACTEMRA) - non-COVID treatment     PRN: acetaminophen, albuterol, HYDROcodone-acetaminophen, ondansetron **OR** ondansetron (ZOFRAN) IV, polyethylene glycol  Assessment: 74 y.o. F presents with COVID 19. Found to have elevated trop to 1961. D-dimer also 11.84. To begin heparin for ACS. Cardiology consulted. No AC PTA  07/17/19  First heparin level undetectable   No bleeding reported  Goal of Therapy:  Heparin level 0.3-0.7 units/ml Monitor platelets by anticoagulation protocol: Yes   Plan:   Re-bolus with heparin 2000 units IV x 1  Increase heparin infusion to 1200 units/hr  Check heparin level in 8hr  Daily heparin level and CBC  Peggyann Juba,  PharmD, BCPS Pharmacy: [phone]  07/17/2019,3:33 PM

## 2019-07-17 NOTE — Progress Notes (Signed)
ANTICOAGULATION CONSULT NOTE - Initial Consult  Pharmacy Consult for Heparin Indication: chest pain/ACS  Allergies  Allergen Reactions  . Codeine Nausea And Vomiting  . Tetracyclines & Related Rash  . Claritin [Loratadine] Hives    Patient Measurements: Height: 5\' 4"  (162.6 cm) Weight: 213 lb (96.6 kg) IBW/kg (Calculated) : 54.7 Heparin Dosing Weight: 77 kg  Vital Signs: Temp: 97.9 F (36.6 C) (01/12 1930) Temp Source: Oral (01/12 1930) BP: 144/123 (01/13 0200) Pulse Rate: 92 (01/13 0215)  Labs: Recent Labs    07/16/19 1854 07/17/19 0132  HGB 12.3  --   HCT 37.0  --   PLT 398  --   CREATININE 0.72  --   TROPONINIHS 109* 1,961*    Estimated Creatinine Clearance: 71.7 mL/min (by C-G formula based on SCr of 0.72 mg/dL).   Medical History: Past Medical History:  Diagnosis Date  . Acute bronchitis   . Age-related osteoporosis without current pathological fracture   . Allergic rhinitis due to pollen   . Bronchitis   . Chronic obstructive pulmonary disease, unspecified ([place])   . Cystocele 10/14/2015  . Headache(784.0)    menstral migraines  . Influenza due to unidentified influenza virus with other respiratory manifestations   . Obesity   . Obesity, unspecified   . Osteoporosis   . Osteoporosis, unspecified   . Other chest pain   . Rectocele 11/16/2012  . Rosacea   . Shortness of breath     Medications:  See electronic med rec  Assessment: 74 y.o. F presents with COVID 19. Found to have elevated trop to 1961. D-dimer also 11.84. To begin heparin for ACS. No AC PTA, no AC given here yet. CBC ok on admission.  Goal of Therapy:  Heparin level 0.3-0.7 units/ml Monitor platelets by anticoagulation protocol: Yes   Plan:  Heparin IV bolus 4000 units Heparin gtt at 950 units/hr Will f/u heparin level in 8 hours Daily heparin level and CBC  Sherlon Handing, PharmD, BCPS Please see amion for complete clinical pharmacist phone list 07/17/2019,3:06 AM

## 2019-07-17 NOTE — ED Notes (Signed)
[facility] here to get patient  

## 2019-07-17 NOTE — ED Notes (Addendum)
Pt given meal tray. Still awaiting pharmacy to bring meds

## 2019-07-17 NOTE — Progress Notes (Addendum)
I spoke with cardiology on-call Dr. Gwenlyn Found about patient's abnormal echocardiogram and elevated troponins.   Plan to continue medical therapy, no indication for invasive therapy at this point.  Continue to trend enzymes.

## 2019-07-17 NOTE — Progress Notes (Signed)
Pt placed on 6L HFNC. Pt tolerating well.

## 2019-07-17 NOTE — Consult Note (Addendum)
Cardiology Consult    Patient ID: ESMIRNA [id number]; SJ:[id number]; [birth year]   Admit date: 07/16/2019 Date of Consult: 07/17/2019  Primary Care Provider: Patient, No Pcp Per Primary Cardiologist: New to [facility] - Dr. Harl Bowie  Patient Profile    Jessica Lewis is a 74 y.o. female with past medical history of COPD and former tobacco use who is being seen today for the evaluation of elevated troponin values in the setting of COVID-19 PNA at the request of Dr. Carles Collet.   History of Present Illness    Jessica Lewis presented to [facility] ED on 07/16/2019 for evaluation of worsening fever, cough and dyspnea for the past 2 weeks. She reports having family members over for the holidays and questions if this is when she contracted the virus. Says she and her husband had been wearing masks and social distancing from others when out of the house. They were not evaluated by their PCP as they were previously followed by Dr. Luan Pulling who retired last month. The patient's dyspnea acutely worsened yesterday which prompted her to come to the ED. Of note, her husband is currently admitted to the ICU here at [facility]. She denies any exertional chest pain but has experienced a burning sensation along her epigastric region which spontaneously occurs. Typically worse with her dry cough. She denies any recent orthopnea, PND, or palpitations.   No known personal history of CAD, CHF, HTN, HLD or Type 2 DM. She has COPD and is a former smoker but quit in 1987. She denies any family history of CAD or CHF. Says her mother died from complications secondary to a CVA.  Initial labs showed WBC 15.0, hemoglobin 12.3, platelets 398, Na+ 134, K+ 3.6 and creatinine 0.72. Lactic acid 1.6. D-dimer 11.84. Fibrinogen > 800. CRP 37.8. Initial HS Troponin 109 with repeat of 1961. CXR showed patchy bilateral airspace disease consistent with PNA. CTA negative for PE but did show multiple ground glass opacities consistent with PNA. EKG on admission showed  NSR, HR 97 with slight ST elevation along inferior leads. Telemetry reviewed and shows NSR, HR in 80's to 90's with occasional PVC's.   She has been started on Remdesivir, Decadron and Actemra for COVID-19 PNA. Was started on Heparin by the admitting team overnight given her uptrending troponin values.    Past Medical History:  Diagnosis Date  . Acute bronchitis   . Age-related osteoporosis without current pathological fracture   . Allergic rhinitis due to pollen   . Bronchitis   . Chronic obstructive pulmonary disease, unspecified ([place])   . Cystocele 10/14/2015  . Headache(784.0)    menstral migraines  . Influenza due to unidentified influenza virus with other respiratory manifestations   . Obesity   . Obesity, unspecified   . Osteoporosis   . Osteoporosis, unspecified   . Other chest pain   . Rectocele 11/16/2012  . Rosacea   . Shortness of breath     Past Surgical History:  Procedure Laterality Date  . CHOLECYSTECTOMY    . COLONOSCOPY  02/09/2011   Procedure: COLONOSCOPY;  Surgeon: Rogene Houston, MD;  Location: [facility];  Service: Endoscopy;  Laterality: N/A;  . TUBAL LIGATION Bilateral      Home Medications:  Prior to Admission medications   Medication Sig Start Date End Date Taking? Authorizing Provider  albuterol (VENTOLIN HFA) 108 (90 Base) MCG/ACT inhaler Inhale 2 puffs into the lungs every 6 (six) hours as needed for wheezing or shortness of breath.  Yes [provider]  Ascorbic Acid (VITAMIN C) 500 MG CHEW Chew 1 tablet by mouth daily.   Yes [provider]  calcium carbonate (OS-CAL - DOSED IN MG OF ELEMENTAL CALCIUM) 1250 (500 Ca) MG tablet Take 1,000 mg by mouth daily.    Yes [provider]  denosumab (PROLIA) 60 MG/ML SOLN injection Inject 60 mg into the skin every 6 (six) months. Administer in upper arm, thigh, or abdomen   Yes [provider]  Fluticasone-Umeclidin-Vilant (TRELEGY ELLIPTA) 100-62.5-25 MCG/INH AEPB  Inhale 1 puff into the lungs daily.   Yes [provider]  Multiple Vitamins-Minerals (ONE-A-DAY WOMENS PETITES PO) Take by mouth daily.   Yes [provider]  Vitamin D, Ergocalciferol, (DRISDOL) 1.25 MG (50000 UNIT) CAPS capsule Take 50,000 Units by mouth every Tuesday.  05/10/19  Yes [provider]  zinc gluconate 50 MG tablet Take 50 mg by mouth daily.   Yes [provider]    Inpatient Medications: Scheduled Meds: . aspirin EC  81 mg Oral Daily  . dexamethasone (DECADRON) injection  6 mg Intravenous Q24H  . fluticasone furoate-vilanterol  1 puff Inhalation Daily   And  . umeclidinium bromide  1 puff Inhalation Daily  . sodium chloride flush  3 mL Intravenous Q12H   Continuous Infusions: . heparin 950 Units/hr (07/17/19 0330)  . remdesivir 100 mg in NS 100 mL    . tocilizumab (ACTEMRA) - non-COVID treatment     PRN Meds: acetaminophen, albuterol, HYDROcodone-acetaminophen, ondansetron **OR** ondansetron (ZOFRAN) IV, polyethylene glycol  Allergies:    Allergies  Allergen Reactions  . Codeine Nausea And Vomiting  . Tetracyclines & Related Rash  . Claritin [Loratadine] Hives    Social History:   Social History   Socioeconomic History  . Marital status: Married    Spouse name: Not on file  . Number of children: Not on file  . Years of education: Not on file  . Highest education level: Not on file  Occupational History  . Not on file  Tobacco Use  . Smoking status: Former Smoker    Types: Cigarettes  . Smokeless tobacco: Never Used  Substance and Sexual Activity  . Alcohol use: No  . Drug use: No  . Sexual activity: Not Currently    Birth control/protection: None  Other Topics Concern  . Not on file  Social History Narrative  . Not on file   Social Determinants of Health   Financial Resource Strain:   . Difficulty of Paying Living Expenses: Not on file  Food Insecurity:   . Worried About Charity fundraiser in the Last  Year: Not on file  . Ran Out of Food in the Last Year: Not on file  Transportation Needs:   . Lack of Transportation (Medical): Not on file  . Lack of Transportation (Non-Medical): Not on file  Physical Activity:   . Days of Exercise per Week: Not on file  . Minutes of Exercise per Session: Not on file  Stress:   . Feeling of Stress : Not on file  Social Connections:   . Frequency of Communication with Friends and Family: Not on file  . Frequency of Social Gatherings with Friends and Family: Not on file  . Attends Religious Services: Not on file  . Active Member of Clubs or Organizations: Not on file  . Attends Archivist Meetings: Not on file  . Marital Status: Not on file  Intimate Partner Violence:   . Fear of  Current or Ex-Partner: Not on file  . Emotionally Abused: Not on file  . Physically Abused: Not on file  . Sexually Abused: Not on file     Family History:    Family History  Problem Relation Age of Onset  . Stroke Mother       Review of Systems    General:  No night sweats or weight changes. Positive for fever and chills.  Cardiovascular:  No chest pain, edema, orthopnea, palpitations, paroxysmal nocturnal dyspnea. Positive for dyspnea on exertion.  Dermatological: No rash, lesions/masses Respiratory: No cough, dyspnea Urologic: No hematuria, dysuria Abdominal:   No nausea, vomiting, diarrhea, bright red blood per rectum, melena, or hematemesis Neurologic:  No visual changes, wkns, changes in mental status. All other systems reviewed and are otherwise negative except as noted above.  Physical Exam/Data    Vitals:   07/17/19 0648 07/17/19 0700 07/17/19 0800 07/17/19 0832  BP:  (!) 137/56 (!) 141/55   Pulse:  87 99   Resp:  (!) 24 (!) 27   Temp: 98.5 F (36.9 C)     TempSrc: Oral     SpO2:  91% 93% 91%  Weight:      Height:       No intake or output data in the 24 hours ending 07/17/19 0905 Filed Weights   07/16/19 1745  Weight: 96.6 kg    Body mass index is 36.56 kg/m.   General: Pleasant, NAD Psych: Normal affect. Neuro: Alert and oriented X 3. Moves all extremities spontaneously. HEENT: Normal  Neck: Supple without bruits or JVD. Lungs:  Resp regular and unlabored, rales along bases with no wheezing appreciated. Heart: RRR no s3, s4, or murmurs. Abdomen: Soft, non-tender, non-distended, BS + x 4.  Extremities: No clubbing or cyanosis. Trace ankle edema bilaterally. DP/PT/Radials 2+ and equal bilaterally.   EKG:  The EKG was personally reviewed and demonstrates: NSR, HR 97 with slight ST elevation along inferior leads.   Telemetry:  Telemetry was personally reviewed and demonstrates: NSR, HR in 80's to 90's with occasional PVC's.    Labs/Studies     Relevant CV Studies:  Echocardiogram: Pending  Laboratory Data:  Chemistry Recent Labs  Lab 07/16/19 1854  NA 134*  K 3.6  CL 100  CO2 25  GLUCOSE 131*  BUN 20  CREATININE 0.72  CALCIUM 8.1*  GFRNONAA >60  GFRAA >60  ANIONGAP 9    Recent Labs  Lab 07/16/19 1854  PROT 6.8  ALBUMIN 2.4*  AST 49*  ALT 43  ALKPHOS 129*  BILITOT 0.6   Hematology Recent Labs  Lab 07/16/19 1854  WBC 15.0*  RBC 4.20  HGB 12.3  HCT 37.0  MCV 88.1  MCH 29.3  MCHC 33.2  RDW 15.0  PLT 398   Cardiac EnzymesNo results for input(s): TROPONINI in the last 168 hours. No results for input(s): TROPIPOC in the last 168 hours.  BNPNo results for input(s): BNP, PROBNP in the last 168 hours.  DDimer  Recent Labs  Lab 07/16/19 1854  DDIMER 11.84*    Radiology/Studies:  CT Angio Chest PE W and/or Wo Contrast  Result Date: 07/16/2019 CLINICAL DATA:  Shortness of breath. EXAM: CT ANGIOGRAPHY CHEST WITH CONTRAST TECHNIQUE: Multidetector CT imaging of the chest was performed using the standard protocol during bolus administration of intravenous contrast. Multiplanar CT image reconstructions and MIPs were obtained to evaluate the vascular anatomy. CONTRAST:  167mL  OMNIPAQUE IOHEXOL 350 MG/ML SOLN COMPARISON:  Nov 03, 2017. FINDINGS: Cardiovascular:  Satisfactory opacification of the pulmonary arteries to the segmental level. No evidence of pulmonary embolism. Normal heart size. No pericardial effusion. Atherosclerosis of thoracic aorta is noted without aneurysm or dissection. Mediastinum/Nodes: No enlarged mediastinal, hilar, or axillary lymph nodes. Thyroid gland, trachea, and esophagus demonstrate no significant findings. Lungs/Pleura: No pneumothorax or pleural effusion is noted. Multiple ground-glass opacities are noted throughout both lungs consistent with multifocal pneumonia. Upper Abdomen: No acute abnormality. Musculoskeletal: No chest wall abnormality. No acute or significant osseous findings. Review of the MIP images confirms the above findings. IMPRESSION: No definite evidence of pulmonary embolus. Multiple ground-glass opacities are noted throughout both lungs consistent with multifocal pneumonia. Aortic Atherosclerosis (ICD10-I70.0). Electronically Signed   By: Marijo Conception M.D.   On: 07/16/2019 21:20   DG Chest Portable 1 View  Result Date: 07/16/2019 CLINICAL DATA:  Fever, cough, shortness of breath EXAM: PORTABLE CHEST 1 VIEW COMPARISON:  09/12/2018 FINDINGS: Patchy bilateral airspace opacities throughout both lungs concerning for pneumonia. Heart is borderline in size. No effusions or pneumothorax. No acute bony abnormality. IMPRESSION: Patchy bilateral airspace disease compatible with pneumonia. Electronically Signed   By: Rolm Baptise M.D.   On: 07/16/2019 18:56     Assessment & Plan    1. Elevated Troponin in the setting of COVID-19 PNA - she has experienced worsening dyspnea for the past two weeks but denies any associated exertional chest pain. Her reports of sternal chest pain seem more consistent with pleuritic pain due to her dry cough.  -  Initial HS Troponin 109 with repeat of 1961. CXR showed patchy bilateral airspace disease  consistent with PNA. CTA negative for PE but did show multiple ground glass opacities consistent with PNA. EKG shows NSR, HR 97 with slight upsloping ST segment along inferior leads.  - suspect her enzyme elevation is secondary to demand ischemia in the setting of her acute illness. Cardiac risk factors include former tobacco use but she denies any known CAD, CHF, HTN, HLD or Type 2 DM.  Agree with obtaining an echocardiogram to assess LV function and wall motion. Medical management would be favored at this time if found to have a cardiomyopathy. Start ASA 81mg  daily. Check Hgb A1c and FLP for risk stratification. She has been started on Heparin and this can be continued for 48 hours. Will review with Dr. Harl Bowie but do not currently see any indications for admission to [facility] in place of place of planned transfer to [facility].   2. COVID-19 PNA - saturations in the 90's on 6L [place]. Desaturated into the 80's briefly when tubing being changed by RT. Remains on Remdesivir, Decadron and Actemra. Awaiting transfer to [facility].    For questions or updates, please contact [place] Please consult [email or web address] for contact info under Cardiology/STEMI.  Signed, Erma Heritage, PA-C 07/17/2019, 9:05 AM Pager: [phone]  Patient discussed with PA Ahmed Prima, history, documentation and all data reviewed. In attempt to limit staff exposure and PPE utilization an indenependent exam was not performed however exam findings from ER, primary team, and from PA Strader's documentation reviewed.  74 yo female history of COPD admitted with 2 weeks of progressive SOB, cough, fevers, malaise. Has had some chest discomfort with coughing.    ER vitals: p 114 bp 137/76 75% RA Lactic acid 1.6 WBC 15 Hgb 12.3 Plt 398 K 3.6 Cr 0.72 Ddimer 11.8 Procalc .20  Resp panel COVID + POC COVID neg  hstrop 109-->1961 CXR patchy airspace disease bilaterally EKG sinus tach, Jpoint elevation CT  PE no PE, +multifocal pneumonai.    Limited echo pending  Patient presents with COVID pneumonia, severe hypoxia to mid 70s on presentation. In this setting elevated troponin likely demand ischemia, perhaps a component of myocarditis. EKG and clinical history not consistent with ACS and overall less likely. Was started on heparin on admission. Can continue for now, f/u echo and repeat troponins, if normal echo findings and trop stabilizes or downtrends would d/c heparin. Continue ASA for now. From cardiac standpoint ok for [facility] admission.    Carlyle Dolly MD

## 2019-07-18 DIAGNOSIS — U071 COVID-19: Principal | ICD-10-CM

## 2019-07-18 DIAGNOSIS — J1282 Pneumonia due to coronavirus disease 2019: Secondary | ICD-10-CM

## 2019-07-18 LAB — TROPONIN I (HIGH SENSITIVITY)
Troponin I (High Sensitivity): 1664 ng/L (ref <18)
Troponin I (High Sensitivity): 1170 ng/L (ref <18)
Troponin I (High Sensitivity): 1218 ng/L (ref <18)

## 2019-07-18 LAB — COMPREHENSIVE METABOLIC PANEL
ALT: 41 U/L (ref 0–44)
AST: 52 U/L — ABNORMAL HIGH (ref 15–41)
Albumin: 2 g/dL — ABNORMAL LOW (ref 3.5–5.0)
Alkaline Phosphatase: 109 U/L (ref 38–126)
Anion gap: 11 (ref 5–15)
BUN: 24 mg/dL — ABNORMAL HIGH (ref 8–23)
CO2: 25 mmol/L (ref 22–32)
Calcium: 7.9 mg/dL — ABNORMAL LOW (ref 8.9–10.3)
Chloride: 102 mmol/L (ref 98–111)
Creatinine, Ser: 0.56 mg/dL (ref 0.44–1.00)
GFR calc Af Amer: >60 mL/min (ref >60)
GFR calc non Af Amer: >60 mL/min (ref >60)
Glucose, Bld: 126 mg/dL — ABNORMAL HIGH (ref 70–99)
Potassium: 5.3 mmol/L — ABNORMAL HIGH (ref 3.5–5.1)
Sodium: 138 mmol/L (ref 135–145)
Total Bilirubin: 0.2 mg/dL — ABNORMAL LOW (ref 0.3–1.2)
Total Protein: 6.1 g/dL — ABNORMAL LOW (ref 6.5–8.1)

## 2019-07-18 LAB — HEPARIN LEVEL (UNFRACTIONATED)
Heparin Unfractionated: <0.1 IU/mL — ABNORMAL LOW (ref 0.30–0.70)
Heparin Unfractionated: <0.1 IU/mL — ABNORMAL LOW (ref 0.30–0.70)
Heparin Unfractionated: <0.1 IU/mL — ABNORMAL LOW (ref 0.30–0.70)

## 2019-07-18 LAB — CBC
HCT: 35 % — ABNORMAL LOW (ref 36.0–46.0)
Hemoglobin: 11.9 g/dL — ABNORMAL LOW (ref 12.0–15.0)
MCH: 29.5 pg (ref 26.0–34.0)
MCHC: 34 g/dL (ref 30.0–36.0)
MCV: 86.6 fL (ref 80.0–100.0)
Platelets: 390 10*3/uL (ref 150–400)
RBC: 4.04 MIL/uL (ref 3.87–5.11)
RDW: 15 % (ref 11.5–15.5)
WBC: 10.5 10*3/uL (ref 4.0–10.5)
nRBC: 0 % (ref 0.0–0.2)

## 2019-07-18 LAB — LIPID PANEL
Cholesterol: 113 mg/dL (ref 0–200)
HDL: 11 mg/dL — ABNORMAL LOW (ref >40)
LDL Cholesterol: 78 mg/dL (ref 0–99)
Total CHOL/HDL Ratio: 10.3 RATIO
Triglycerides: 118 mg/dL (ref <150)
VLDL: 24 mg/dL (ref 0–40)

## 2019-07-18 LAB — FERRITIN: Ferritin: 815 ng/mL — ABNORMAL HIGH (ref 11–307)

## 2019-07-18 LAB — D-DIMER, QUANTITATIVE: D-Dimer, Quant: 8.02 ug/mL-FEU — ABNORMAL HIGH (ref 0.00–0.50)

## 2019-07-18 LAB — C-REACTIVE PROTEIN: CRP: 26.8 mg/dL — ABNORMAL HIGH (ref <1.0)

## 2019-07-18 LAB — MAGNESIUM: Magnesium: 2.1 mg/dL (ref 1.7–2.4)

## 2019-07-18 LAB — HEMOGLOBIN A1C
Hgb A1c MFr Bld: 5.4 % (ref 4.8–5.6)
Mean Plasma Glucose: 108.28 mg/dL

## 2019-07-18 MED ORDER — FUROSEMIDE 10 MG/ML IJ SOLN
40.0000 mg | Freq: Once | INTRAMUSCULAR | Status: AC
  Administered 2019-07-18: 12:00:00 40 mg via INTRAVENOUS
  Filled 2019-07-18: qty 4

## 2019-07-18 MED ORDER — ATORVASTATIN CALCIUM 10 MG PO TABS
20.0000 mg | ORAL_TABLET | Freq: Every day | ORAL | Status: DC
  Administered 2019-07-18 – 2019-07-22 (×5): 20 mg via ORAL
  Filled 2019-07-18 (×5): qty 2

## 2019-07-18 MED ORDER — HEPARIN BOLUS VIA INFUSION
4000.0000 [IU] | Freq: Once | INTRAVENOUS | Status: AC
  Administered 2019-07-18: 4000 [IU] via INTRAVENOUS
  Filled 2019-07-18: qty 4000

## 2019-07-18 MED ORDER — HEPARIN BOLUS VIA INFUSION
2000.0000 [IU] | Freq: Once | INTRAVENOUS | Status: AC
  Administered 2019-07-18: 2000 [IU] via INTRAVENOUS
  Filled 2019-07-18: qty 2000

## 2019-07-18 MED ORDER — HEPARIN (PORCINE) 25000 UT/250ML-% IV SOLN
1900.0000 [IU]/h | INTRAVENOUS | Status: DC
  Filled 2019-07-18: qty 250

## 2019-07-18 MED ORDER — METOPROLOL TARTRATE 25 MG PO TABS
12.5000 mg | ORAL_TABLET | Freq: Two times a day (BID) | ORAL | Status: DC
  Administered 2019-07-18 – 2019-07-22 (×8): 12.5 mg via ORAL
  Filled 2019-07-18 (×11): qty 1

## 2019-07-18 MED ORDER — HEPARIN BOLUS VIA INFUSION
1000.0000 [IU] | Freq: Once | INTRAVENOUS | Status: AC
  Administered 2019-07-18: 1000 [IU] via INTRAVENOUS
  Filled 2019-07-18: qty 1000

## 2019-07-18 NOTE — Progress Notes (Signed)
ANTICOAGULATION CONSULT NOTE - Follow Up Consult  Pharmacy Consult for Heparin Indication: chest pain/ACS  Allergies  Allergen Reactions  . Codeine Nausea And Vomiting  . Tetracyclines & Related Rash  . Claritin [Loratadine] Hives    Patient Measurements: Height: 5\' 4"  (162.6 cm) Weight: 213 lb (96.6 kg) IBW/kg (Calculated) : 54.7 Heparin Dosing Weight: 76.8 kg  Vital Signs: Temp: 97.6 F (36.4 C) (01/14 2024) Temp Source: Oral (01/14 2024) BP: 98/49 (01/14 2150) Pulse Rate: 96 (01/14 2024)  Labs: Recent Labs    07/16/19 1854 07/17/19 0132 07/18/19 0147 07/18/19 0650 07/18/19 1000 07/18/19 1212 07/18/19 2033  HGB 12.3  --  11.9*  --   --   --   --   HCT 37.0  --  35.0*  --   --   --   --   PLT 398  --  390  --   --   --   --   HEPARINUNFRC  --    < > <0.10*  --  <0.10*  --  <0.10*  CREATININE 0.72  --  0.56  --   --   --   --   TROPONINIHS 109*   < > 1,664* 1,170*  --  1,218*  --    < > = values in this interval not displayed.    Estimated Creatinine Clearance: 71.7 mL/min (by C-G formula based on SCr of 0.56 mg/dL).  Assessment: 74 y.o. F presents with COVID 19. Found to have elevated trop to 1961. D-dimer also 11.84. To begin heparin for ACS. Cardiology consulted. No AC PTA. Per TRH, heparin x 48 h total for NSTEMI.   07/18/19 2200   Fourth  heparin level undetectable   RN thinks  infusion was paused for a bit at shift change, but she flushed the line and it's running properly now  No bleeding reported  Goal of Therapy:  Heparin level 0.3-0.7 units/ml Monitor platelets by anticoagulation protocol: Yes   Plan:   Please give another 2000 unit heparin  bolus now  Increase heparin infusion rate to 1900 units/hr  Re-check   heparin level in ~8h (with daily level)  Heparin through 1/15  Daily heparin level and CBC  Monitor for s/s of bleeding  Despina Pole, Pharm. D. Clinical Pharmacist 07/18/2019 10:23 PM

## 2019-07-18 NOTE — Progress Notes (Addendum)
PROGRESS NOTE    RHIANNE FRADETTE  [id number] DOB: [birth year] DOA: 07/16/2019 PCP: Patient, No Pcp Per    Brief Narrative:  74 year old female who presented with dyspnea, cough and fever.  She does have significant past medical history for COPD.  Patient reported progressive dyspnea, cough, fevers and malaise for about 2 weeks.  On her initial physical examination her oximetry was 75% on room air, respiratory rate 30, heart rate 110, her lungs had scattered rhonchi, heart S1-S2 present, rhythmic, abdomen soft, no lower extremity edema. Sodium 134, potassium 3.6, chloride 100, bicarb 25, glucose 131, BUN 20, creatinine 0.72, troponin I (HS)109, white count 15.0, hemoglobin 12.3, hematocrit 37.0, platelets 398.  Chest radiograph with bilateral upper lobes interstitial infiltrates.  Initial EKG rate 112 bpm, normal axis, normal intervals, sinus rhythm, ST elevation in lead III, aVF, V5 and V6, small Q-wave lead II, III and aVF, no significant T wave abnormalities.  Follow-up EKG with lower heart rate with resolution of ST elevations.  Patient was admitted to the hospital working diagnosis of acute hypoxic respiratory failure due to SARS COVID-19 viral pneumonia.  Assessment & Plan:   Principal Problem:   Pneumonia due to COVID-19 virus Active Problems:   Chronic obstructive pulmonary disease, unspecified ([place])   Acute respiratory failure with hypoxia (HCC)   Elevated troponin   1. Acute hypoxic respiratory failure due to SARS COVID 19 viral pneumonia. Sp tocilizumab 07/17/19.   RR: 24  Pulse oxymetry: 90  Fi02: 10 L/min per HFNC   COVID-19 Labs  Recent Labs    07/16/19 1854 07/16/19 1855 07/18/19 0147  DDIMER 11.84*  --  8.02*  FERRITIN  --  737* 815*  LDH 325*  --   --   CRP  --  37.8* 26.8*    Lab Results  Component Value Date   SARSCOV2NAA POSITIVE (A) 07/16/2019   Persistent elevation in inflammatory markers and worsening hypoxemia, this am with 10 L/min per HFNC.  Clinically with signs of hypervolemia, will add diuresis with furosemide 40 mg IV x1.   Continue medical therapy with Remdsivir #2/5 and dexamethasone IV. Antitussive agents, bronchodilators and airway clearing techniques with flutter valve and incentive spirometer.   2. NSTEMI/ MI type 2/ with cardiogenic pulmonary edema, acute. Patient with no chest pain but persistent dyspnea, troponin I now trending down, echocardiogram with apical hypokinesis but preserved LV systolic function, EKG this am with 100 bpm, normal axis with normal intervals, sinus rhythm, with no st segment or t wave abnormalities.   Trial of diuresis with furosemide, keep negative fluid balance.   Will continue medical therapy with IV heparin for total of 48 H, and will add metoprolol, will hold on ace inh due to risk of hypotension. Will need outpatient follow up echocardiogram as outpatient.   3. COPD with acute exacerbation. Will continue bronchodilator/ inhaled steroids therapy and systemic corticosteroids.   4. Dyslipidemia. Continue statin therapy with atorvastatin.   Patient continue to be at high risk for worsening respiratory failure.   DVT prophylaxis: IV heparin   Code Status:  full Family Communication: I spoke over the phone with the patient's daughter about patient's  condition, plan of care and all questions were addressed. Disposition Plan/ discharge barriers: pending completion of therapy and improvement in oxygenation.    Consultants:   Cardiology    Subjective: Mild improvement in her symptoms but continue to have persistent dyspnea, worse with minimal efforts, no frank chest pain, no nausea or vomiting,   Objective:  Vitals:   07/18/19 0101 07/18/19 0452 07/18/19 0520 07/18/19 0744  BP: (!) 122/52 138/61 126/66 (!) 147/50  Pulse: 88 92 72 77  Resp: (!) 25 (!) 29 (!) 22 (!) 24  Temp: (!) 97.3 F (36.3 C)  97.8 F (36.6 C) 97.8 F (36.6 C)  TempSrc: Oral  Oral Oral  SpO2: 90% 90%  90%    Weight:      Height:        Intake/Output Summary (Last 24 hours) at 07/18/2019 0837 Last data filed at 07/18/2019 0520 Gross per 24 hour  Intake 480 ml  Output 1300 ml  Net -820 ml   Filed Weights   07/16/19 1745  Weight: 96.6 kg    Examination:   General: Not in pain or dyspnea, deconditioned  Neurology: Awake and alert, non focal  E ENT: no pallor, no icterus, oral mucosa moist Cardiovascular: positive JVD. S1-S2 present, rhythmic, no gallops, rubs, or murmurs. ++ pitting bilateral lower extremity edema. Pulmonary: positive breath sounds bilaterally, adequate air movement; Gastrointestinal. Abdomen with no organomegaly, non tender, no rebound or guarding Skin. No rashes Musculoskeletal: no joint deformities     Data Reviewed: I have personally reviewed following labs and imaging studies  CBC: Recent Labs  Lab 07/16/19 1854 07/18/19 0147  WBC 15.0* 10.5  NEUTROABS 11.8*  --   HGB 12.3 11.9*  HCT 37.0 35.0*  MCV 88.1 86.6  PLT 398 [id number]   Basic Metabolic Panel: Recent Labs  Lab 07/16/19 1854  NA 134*  K 3.6  CL 100  CO2 25  GLUCOSE 131*  BUN 20  CREATININE 0.72  CALCIUM 8.1*   GFR: Estimated Creatinine Clearance: 71.7 mL/min (by C-G formula based on SCr of 0.72 mg/dL). Liver Function Tests: Recent Labs  Lab 07/16/19 1854  AST 49*  ALT 43  ALKPHOS 129*  BILITOT 0.6  PROT 6.8  ALBUMIN 2.4*   No results for input(s): LIPASE, AMYLASE in the last 168 hours. No results for input(s): AMMONIA in the last 168 hours. Coagulation Profile: No results for input(s): INR, PROTIME in the last 168 hours. Cardiac Enzymes: No results for input(s): CKTOTAL, CKMB, CKMBINDEX, TROPONINI in the last 168 hours. BNP (last 3 results) No results for input(s): PROBNP in the last 8760 hours. HbA1C: Recent Labs    07/18/19 0147  HGBA1C 5.4   CBG: No results for input(s): GLUCAP in the last 168 hours. Lipid Profile: Recent Labs    07/16/19 1855  TRIG 180*    Thyroid Function Tests: No results for input(s): TSH, T4TOTAL, FREET4, T3FREE, THYROIDAB in the last 72 hours. Anemia Panel: Recent Labs    07/16/19 1855 07/18/19 0147  FERRITIN 737* 36*      Radiology Studies: I have reviewed all of the imaging during this hospital visit personally     Scheduled Meds: . aspirin EC  81 mg Oral Daily  . dexamethasone (DECADRON) injection  6 mg Intravenous Q24H  . fluticasone furoate-vilanterol  1 puff Inhalation Daily   And  . umeclidinium bromide  1 puff Inhalation Daily  . sodium chloride flush  3 mL Intravenous Q12H   Continuous Infusions: . heparin 1,400 Units/hr (07/18/19 0430)  . remdesivir 100 mg in NS 100 mL Stopped (07/17/19 2300)  . tocilizumab (ACTEMRA) - non-COVID treatment       LOS: 1 day        Ladarrius Bogdanski Gerome Apley, MD

## 2019-07-18 NOTE — Progress Notes (Signed)
OT Cancellation Note  Patient Details Name: Jessica Lewis MRN: [id number] DOB: April 20, 1946   Cancelled Treatment:    Reason Eval/Treat Not Completed: Medical issues which prohibited therapy. MD requesting to hold. Will follow up tomorrow and assess medical readiness.   Wylodean Shimmel,HILLARY 07/18/2019, 10:10 AM  Maurie Boettcher, OT/L   Acute OT Clinical Specialist Acute Rehabilitation Services Pager [phone] Office [phone]

## 2019-07-18 NOTE — Evaluation (Signed)
Physical Therapy Evaluation Patient Details Name: Jessica Lewis MRN: [id number] DOB: 06-07-1946 Today's Date: 07/18/2019   History of Present Illness  74 year old female with COVID/Pneumonia and currently on 10 L HFNC. PMH includes COPD, Osteoporosis, obesity, rosacea, and allergic rhinitis.  Clinical Impression  Very pleasant female patient, receptive to PT. Per OT, OT was held today, but PT had cleared with RN to see patient. She did have an EKG at end of PT session. No adverse effects overall, but does need to be monitored closely due to easily desatting- currently on 10 L, HFNC- did not use oxygen at home. Lives with spouse (he is [facility] at [facility] with [place]). She has generalized weakness. Home is all one level with one outside step, but also has a ramp. Walk in shower, grab bars and elevated commode seat. No AD for ambulation and was independent in all ADLs and IADLs. Should benefit from PT to address goals as outlined for optimal functional outcomes. Will track closely pending OT being held today.     Follow Up Recommendations Home health PT    Equipment Recommendations  (Used RW during evaluation, but she does not use any AD for ambulation.)    Recommendations for Other Services       Precautions / Restrictions Precautions Precautions: Fall Precaution Comments: Monitor closely related to desats Restrictions Weight Bearing Restrictions: No      Mobility  Bed Mobility Overal bed mobility: Independent;Modified Independent                Transfers Overall transfer level: Modified independent Equipment used: Rolling walker (2 wheeled)                Ambulation/Gait Ambulation/Gait assistance: Min guard Gait Distance (Feet): 27 Feet Assistive device: Rolling walker (2 wheeled) Gait Pattern/deviations: Shuffle;Narrow base of support        Stairs            Wheelchair Mobility    Modified Rankin (Stroke Patients Only)       Balance  Overall balance assessment: Mild deficits observed, not formally tested                                           Pertinent Vitals/Pain      Home Living Family/patient expects to be discharged to:: Private residence Living Arrangements: Spouse/significant other   Type of Home: House Home Access: Stairs to enter Entrance Stairs-Rails: Right Entrance Stairs-Number of Steps: one, and also has a ramp Home Layout: One level Home Equipment: Grab bars - tub/shower Additional Comments: States she has a RW, but that it belongs to spouse from when he had heart surgery.    Prior Function Level of Independence: Independent               Hand Dominance   Dominant Hand: Right    Extremity/Trunk Assessment   Upper Extremity Assessment Upper Extremity Assessment: Defer to OT evaluation    Lower Extremity Assessment Lower Extremity Assessment: Defer to PT evaluation;Generalized weakness    Cervical / Trunk Assessment Cervical / Trunk Assessment: Normal  Communication   Communication: No difficulties  Cognition Arousal/Alertness: Awake/alert Behavior During Therapy: WFL for tasks assessed/performed;Anxious(Mildly anxious, but cooperative) Overall Cognitive Status: Within Functional Limits for tasks assessed  General Comments      Exercises Total Joint Exercises Ankle Circles/Pumps: AROM;Seated Quad Sets: AROM;Seated Gluteal Sets: AROM;Seated Short Arc Quad: AROM;Seated Marching in Standing: Seated;AROM Other Exercises Other Exercises: Practiced IS and able to achieve 750   for 5/5 attempts. Also practiced FLUTTER unit with good return demo   Assessment/Plan    PT Assessment Patient needs continued PT services  PT Problem List Decreased strength;Decreased activity tolerance;Decreased mobility;Cardiopulmonary status limiting activity       PT Treatment Interventions Patient/family  education;Therapeutic activities;Therapeutic exercise;Gait training    PT Goals (Current goals can be found in the Care Plan section)  Acute Rehab PT Goals Patient Stated Goal: Just want to get well and get home with my family PT Goal Formulation: With patient Time For Goal Achievement: 08/01/19 Potential to Achieve Goals: Good    Frequency Min 3X/week   Barriers to discharge        Co-evaluation               AM-PAC PT "6 Clicks" Mobility  Outcome Measure Help needed turning from your back to your side while in a flat bed without using bedrails?: None Help needed moving from lying on your back to sitting on the side of a flat bed without using bedrails?: None Help needed moving to and from a bed to a chair (including a wheelchair)?: A Little Help needed standing up from a chair using your arms (e.g., wheelchair or bedside chair)?: A Little Help needed to walk in hospital room?: A Little Help needed climbing 3-5 steps with a railing? : A Lot 6 Click Score: 19    End of Session   Activity Tolerance: Patient tolerated treatment well Patient left: in chair;with call bell/phone within reach Nurse Communication: Mobility status PT Visit Diagnosis: Muscle weakness (generalized) (M62.81)    Time: [id number] PT Time Calculation (min) (ACUTE ONLY): 50 min   Charges:   PT Evaluation $PT Eval Moderate Complexity: 1 Mod PT Treatments $Gait Training: 8-22 mins $Therapeutic Exercise: 8-22 mins        Rollen Sox, PT # [phone] CGV cell  Casandra Doffing 07/18/2019, 10:20 AM

## 2019-07-18 NOTE — Progress Notes (Signed)
CRITICAL VALUE ALERT  Critical Value:  Troponin 1,218  Date & Time Notied:  07/17/16 1804  Provider Notified: M. Arrien MD and primary RN Tamera Punt.  Orders Received/Actions taken:

## 2019-07-18 NOTE — Progress Notes (Signed)
ANTICOAGULATION CONSULT NOTE - Follow Up Consult  Pharmacy Consult for Heparin Indication: chest pain/ACS  Allergies  Allergen Reactions  . Codeine Nausea And Vomiting  . Tetracyclines & Related Rash  . Claritin [Loratadine] Hives    Patient Measurements: Height: 5\' 4"  (162.6 cm) Weight: 213 lb (96.6 kg) IBW/kg (Calculated) : 54.7 Heparin Dosing Weight: 76.8 kg  Vital Signs: Temp: 97.3 F (36.3 C) (01/14 0101) Temp Source: Oral (01/14 0101) BP: 122/52 (01/14 0101) Pulse Rate: 88 (01/14 0101)  Labs: Recent Labs    07/16/19 1854 07/16/19 1854 07/17/19 0132 07/17/19 1415 07/17/19 1939 07/18/19 0147  HGB 12.3  --   --   --   --  11.9*  HCT 37.0  --   --   --   --  35.0*  PLT 398  --   --   --   --  390  HEPARINUNFRC  --   --   --  <0.10*  --  <0.10*  CREATININE 0.72  --   --   --   --   --   TROPONINIHS 109*   < > 1,961*  --  1,888* 1,664*   < > = values in this interval not displayed.    Estimated Creatinine Clearance: 71.7 mL/min (by C-G formula based on SCr of 0.72 mg/dL).  Assessment: 74 y.o. F presents with COVID 19. Found to have elevated trop to 1961. D-dimer also 11.84. To begin heparin for ACS. Cardiology consulted. No AC PTA  07/18/19 0415  Second heparin level undetectable (resulted at 0147-confirmed w/lab at [facility])  D-Dimer now 8.62mcg/mL-FEU and CBC stable   No bleeding noted at this time  Confirmed with RN that heparin is infusing properly with no site issues  Goal of Therapy:  Heparin level 0.3-0.7 units/ml Monitor platelets by anticoagulation protocol: Yes   Plan:   Re-bolus with heparin 1000 units IV x 1  Increase heparin infusion rate to 1400 units/hr  Check heparin level in 6-8  Daily heparin level and CBC  Despina Pole, Pharm. D. Clinical Pharmacist 07/18/2019 4:16 AM

## 2019-07-18 NOTE — Progress Notes (Addendum)
ANTICOAGULATION CONSULT NOTE - Follow Up Consult  Pharmacy Consult for Heparin Indication: chest pain/ACS  Allergies  Allergen Reactions  . Codeine Nausea And Vomiting  . Tetracyclines & Related Rash  . Claritin [Loratadine] Hives    Patient Measurements: Height: 5\' 4"  (162.6 cm) Weight: 213 lb (96.6 kg) IBW/kg (Calculated) : 54.7 Heparin Dosing Weight: 76.8 kg  Vital Signs: Temp: 97.8 F (36.6 C) (01/14 0744) Temp Source: Oral (01/14 0744) BP: 147/50 (01/14 0744) Pulse Rate: 77 (01/14 0744)  Labs: Recent Labs    07/16/19 1854 07/17/19 0132 07/17/19 1415 07/17/19 1939 07/18/19 0147 07/18/19 0650 07/18/19 1000  HGB 12.3  --   --   --  11.9*  --   --   HCT 37.0  --   --   --  35.0*  --   --   PLT 398  --   --   --  390  --   --   HEPARINUNFRC  --   --  <0.10*  --  <0.10*  --  <0.10*  CREATININE 0.72  --   --   --   --   --   --   TROPONINIHS 109*   < >  --  1,888* 1,664* 1,170*  --    < > = values in this interval not displayed.    Estimated Creatinine Clearance: 71.7 mL/min (by C-G formula based on SCr of 0.72 mg/dL).  Assessment: 74 y.o. F presents with COVID 19. Found to have elevated trop to 1961. D-dimer also 11.84. To begin heparin for ACS. Cardiology consulted. No AC PTA. Per TRH, heparin x 48 h total for NSTEMI.   07/18/19   Third heparin level undetectable   No line issues, infusion not paused per RN  No bleeding reported  CBC stable  Troponin trending down   Goal of Therapy:  Heparin level 0.3-0.7 units/ml Monitor platelets by anticoagulation protocol: Yes   Plan:   Re-bolus with heparin 4000 units IV x 1  Increase heparin infusion rate to 1700 units/hr  Check heparin level in 8h  Heparin through 1/15  Daily heparin level and CBC  Monitor for s/s of bleeding  Ulice Dash, PharmD, BCPS 07/18/2019 11:48 AM

## 2019-07-18 NOTE — Plan of Care (Signed)
  Problem: Education: Goal: Knowledge of risk factors and measures for prevention of condition will improve Outcome: Progressing   Problem: Coping: Goal: Psychosocial and spiritual needs will be supported Outcome: Progressing   Problem: Respiratory: Goal: Will maintain a patent airway Outcome: Progressing Goal: Complications related to the disease process, condition or treatment will be avoided or minimized Outcome: Progressing   

## 2019-07-19 LAB — D-DIMER, QUANTITATIVE: D-Dimer, Quant: 6.13 ug/mL-FEU — ABNORMAL HIGH (ref 0.00–0.50)

## 2019-07-19 LAB — COMPREHENSIVE METABOLIC PANEL
ALT: 37 U/L (ref 0–44)
AST: 31 U/L (ref 15–41)
Albumin: 2 g/dL — ABNORMAL LOW (ref 3.5–5.0)
Alkaline Phosphatase: 97 U/L (ref 38–126)
Anion gap: 9 (ref 5–15)
BUN: 33 mg/dL — ABNORMAL HIGH (ref 8–23)
CO2: 29 mmol/L (ref 22–32)
Calcium: 8 mg/dL — ABNORMAL LOW (ref 8.9–10.3)
Chloride: 102 mmol/L (ref 98–111)
Creatinine, Ser: 0.67 mg/dL (ref 0.44–1.00)
GFR calc Af Amer: >60 mL/min (ref >60)
GFR calc non Af Amer: >60 mL/min (ref >60)
Glucose, Bld: 150 mg/dL — ABNORMAL HIGH (ref 70–99)
Potassium: 4.8 mmol/L (ref 3.5–5.1)
Sodium: 140 mmol/L (ref 135–145)
Total Bilirubin: 0.3 mg/dL (ref 0.3–1.2)
Total Protein: 5.8 g/dL — ABNORMAL LOW (ref 6.5–8.1)

## 2019-07-19 LAB — CBC
HCT: 34.6 % — ABNORMAL LOW (ref 36.0–46.0)
Hemoglobin: 11.7 g/dL — ABNORMAL LOW (ref 12.0–15.0)
MCH: 29.4 pg (ref 26.0–34.0)
MCHC: 33.8 g/dL (ref 30.0–36.0)
MCV: 86.9 fL (ref 80.0–100.0)
Platelets: 455 10*3/uL — ABNORMAL HIGH (ref 150–400)
RBC: 3.98 MIL/uL (ref 3.87–5.11)
RDW: 15.2 % (ref 11.5–15.5)
WBC: 11.5 10*3/uL — ABNORMAL HIGH (ref 4.0–10.5)
nRBC: 0 % (ref 0.0–0.2)

## 2019-07-19 LAB — C-REACTIVE PROTEIN: CRP: 13.8 mg/dL — ABNORMAL HIGH (ref <1.0)

## 2019-07-19 LAB — HEPARIN LEVEL (UNFRACTIONATED): Heparin Unfractionated: 0.41 IU/mL (ref 0.30–0.70)

## 2019-07-19 LAB — FERRITIN: Ferritin: 660 ng/mL — ABNORMAL HIGH (ref 11–307)

## 2019-07-19 MED ORDER — TOCILIZUMAB 400 MG/20ML IV SOLN
800.0000 mg | Freq: Once | INTRAVENOUS | Status: AC
  Administered 2019-07-19: 12:00:00 800 mg via INTRAVENOUS
  Filled 2019-07-19 (×2): qty 40

## 2019-07-19 MED ORDER — FUROSEMIDE 10 MG/ML IJ SOLN
40.0000 mg | Freq: Once | INTRAMUSCULAR | Status: AC
  Administered 2019-07-19: 12:00:00 40 mg via INTRAVENOUS
  Filled 2019-07-19: qty 4

## 2019-07-19 MED ORDER — ENOXAPARIN SODIUM 60 MG/0.6ML ~~LOC~~ SOLN
50.0000 mg | Freq: Two times a day (BID) | SUBCUTANEOUS | Status: DC
  Administered 2019-07-19 – 2019-07-21 (×5): 50 mg via SUBCUTANEOUS
  Filled 2019-07-19 (×6): qty 0.6

## 2019-07-19 NOTE — Progress Notes (Signed)
PROGRESS NOTE    Jessica Lewis  [id number] DOB: 1945-10-27 DOA: 07/16/2019 PCP: Patient, No Pcp Per    Brief Narrative:  74 year old female who presented with dyspnea, cough and fever.  She does have significant past medical history for COPD.  Patient reported progressive dyspnea, cough, fevers and malaise for about 2 weeks.  On her initial physical examination her oximetry was 75% on room air, respiratory rate 30, heart rate 110, her lungs had scattered rhonchi, heart S1-S2 present, rhythmic, abdomen soft, no lower extremity edema. Sodium 134, potassium 3.6, chloride 100, bicarb 25, glucose 131, BUN 20, creatinine 0.72, troponin I (HS)109, white count 15.0, hemoglobin 12.3, hematocrit 37.0, platelets 398.  Chest radiograph with bilateral upper lobes interstitial infiltrates.  Initial EKG rate 112 bpm, normal axis, normal intervals, sinus rhythm, ST elevation in lead III, aVF, V5 and V6, small Q-wave lead II, III and aVF, no significant T wave abnormalities.  Follow-up EKG with lower heart rate with resolution of ST elevations.  Patient was admitted to the hospital working diagnosis of acute hypoxic respiratory failure due to SARS COVID-19 viral pneumonia.  Patient with worsening troponin and apical LV hypokinesis, medical therapy for NSTEMI / MI type 2. Complicated with cardiogenic pulmonary edema. Further work up negative for PE per CT chest.   Patient has responded well to medical therapy including diuresis with furosemide.   Assessment & Plan:   Principal Problem:   Pneumonia due to COVID-19 virus Active Problems:   Chronic obstructive pulmonary disease, unspecified ([place])   Acute respiratory failure with hypoxia (HCC)   Elevated troponin   1. Acute hypoxic respiratory failure due to SARS COVID 19 viral pneumonia. Sp tocilizumab 07/19/19  RR: 20  Pulse oxymetry: 96%  Fi02: 6 L/min HFNC   COVID-19 Labs  Recent Labs    07/16/19 1854 07/16/19 1855 07/18/19 0147  07/19/19 0155  DDIMER 11.84*  --  8.02* 6.13*  FERRITIN  --  737* 815* 660*  LDH 325*  --   --   --   CRP  --  37.8* 26.8* 13.8*    Lab Results  Component Value Date   SARSCOV2NAA POSITIVE (A) 07/16/2019    Persistent elevation in inflammatory markers, but improved oxygenation with decreased oxygen requirements.   Medical therapy with Remdsivir #5/5 and dexamethasone IV. Continue with antitussive agents, bronchodilators and airway clearing techniques with flutter valve and incentive spirometer.   Keep negative fluid balance.   2. NSTEMI/ MI type 2/ with cardiogenic pulmonary edema, acute. echocardiogram with apical hypokinesis but preserved LV systolic function. CT chest negative for pulmonary embolism.   Troponin I trending down, no chest pain or acute ischemic changes in EKG. Patient completed 48 H of full anticoagulation with heparin drip. Continue medical therapy with asa, B blockade and statin, no ace inh due to risk of hypotension.   Patient responded well to diuresis, with urine output of 1,100 ml over last 24 H, but clinically continue to be hypervolemic, will give second dose of furosemide 40 mg IV and follow response.   3. COPD with acute exacerbation. On bronchodilator, inhaled and systemic steroids.   4. Dyslipidemia. On atorvastatin.     DVT prophylaxis: enoxaparin   Code Status:  full Family Communication: I spoke over the phone with the patient's daughter about patient's  condition, plan of care and all questions were addressed. Disposition Plan/ discharge barriers: pending completion of therapy and improvement in oxygenation.   Consultants:   Cardiology     Subjective:  Patient's dyspnea has improved with diuresis, but not yet back to baseline, continue to have dyspnea at rest and not back to baseline, no chest pain, no nausea or vomiting, has been out of bed to chair.   Objective: Vitals:   07/18/19 2150 07/19/19 0006 07/19/19 0418 07/19/19 0725   BP: (!) 98/49 (!) 117/53 (!) 129/51 128/61  Pulse:  76 74   Resp: 20 18 20    Temp:  97.9 F (36.6 C) 97.9 F (36.6 C) 98.1 F (36.7 C)  TempSrc:  Oral Oral Oral  SpO2:  94% 95% 95%  Weight:      Height:        Intake/Output Summary (Last 24 hours) at 07/19/2019 1009 Last data filed at 07/19/2019 0600 Gross per 24 hour  Intake 908.43 ml  Output 1100 ml  Net -191.57 ml   Filed Weights   07/16/19 1745  Weight: 96.6 kg    Examination:   General: Not in pain or dyspnea, deconditioned  Neurology: Awake and alert, non focal  E ENT: mild pallor, no icterus, oral mucosa moist Cardiovascular: Mild JVD. S1-S2 present, rhythmic, no gallops, rubs, or murmurs. ++ pitting lower extremity edema. Pulmonary: positive breath sounds bilaterally. Gastrointestinal. Abdomen with no organomegaly, non tender, no rebound or guarding Skin. No rashes Musculoskeletal: no joint deformities     Data Reviewed: I have personally reviewed following labs and imaging studies  CBC: Recent Labs  Lab 07/16/19 1854 07/18/19 0147 07/19/19 0155  WBC 15.0* 10.5 11.5*  NEUTROABS 11.8*  --   --   HGB 12.3 11.9* 11.7*  HCT 37.0 35.0* 34.6*  MCV 88.1 86.6 86.9  PLT 398 390 [id number]*   Basic Metabolic Panel: Recent Labs  Lab 07/16/19 1854 07/18/19 0147 07/19/19 0155  NA 134* 138 140  K 3.6 5.3* 4.8  CL 100 102 102  CO2 25 25 29   GLUCOSE 131* 126* 150*  BUN 20 24* 33*  CREATININE 0.72 0.56 0.67  CALCIUM 8.1* 7.9* 8.0*  MG  --  2.1  --    GFR: Estimated Creatinine Clearance: 71.7 mL/min (by C-G formula based on SCr of 0.67 mg/dL). Liver Function Tests: Recent Labs  Lab 07/16/19 1854 07/18/19 0147 07/19/19 0155  AST 49* 52* 31  ALT 43 41 37  ALKPHOS 129* 109 97  BILITOT 0.6 0.2* 0.3  PROT 6.8 6.1* 5.8*  ALBUMIN 2.4* 2.0* 2.0*   No results for input(s): LIPASE, AMYLASE in the last 168 hours. No results for input(s): AMMONIA in the last 168 hours. Coagulation Profile: No results for  input(s): INR, PROTIME in the last 168 hours. Cardiac Enzymes: No results for input(s): CKTOTAL, CKMB, CKMBINDEX, TROPONINI in the last 168 hours. BNP (last 3 results) No results for input(s): PROBNP in the last 8760 hours. HbA1C: Recent Labs    07/18/19 0147  HGBA1C 5.4   CBG: No results for input(s): GLUCAP in the last 168 hours. Lipid Profile: Recent Labs    07/16/19 1855 07/18/19 0147  CHOL  --  113  HDL  --  11*  LDLCALC  --  78  TRIG 180* 118  CHOLHDL  --  10.3   Thyroid Function Tests: No results for input(s): TSH, T4TOTAL, FREET4, T3FREE, THYROIDAB in the last 72 hours. Anemia Panel: Recent Labs    07/18/19 0147 07/19/19 0155  FERRITIN 63* 660*      Radiology Studies: I have reviewed all of the imaging during this hospital visit personally     Scheduled Meds: .  aspirin EC  81 mg Oral Daily  . atorvastatin  20 mg Oral q1800  . dexamethasone (DECADRON) injection  6 mg Intravenous Q24H  . enoxaparin (LOVENOX) injection  50 mg Subcutaneous Q12H  . fluticasone furoate-vilanterol  1 puff Inhalation Daily   And  . umeclidinium bromide  1 puff Inhalation Daily  . metoprolol tartrate  12.5 mg Oral BID  . sodium chloride flush  3 mL Intravenous Q12H   Continuous Infusions: . remdesivir 100 mg in NS 100 mL 100 mg (07/18/19 1028)  . tocilizumab (ACTEMRA) - non-COVID treatment       LOS: 2 days        Tiyana Galla Gerome Apley, MD

## 2019-07-19 NOTE — Evaluation (Signed)
Occupational Therapy Evaluation Patient Details Name: Jessica Lewis MRN: [id number] DOB: [birth year] Today's Date: 07/19/2019    History of Present Illness 74 year old female with COVID/Pneumonia and currently on 10 L HFNC. PMH includes COPD, Osteoporosis, obesity, rosacea, and allergic rhinitis.   Clinical Impression   PTA, pt was living with her husband and was independent. Pt currently requiring Supervision-Min Guard A for ADLs and functional mobility with RW. Pt presenting with decreased activity tolerance as seen by fatigue and drops in SpO2. Pt's SpO2 fluctuating between 96-85% on 4L during functional mobility. Pt would benefit from further acute OT to facilitate safe dc. Recommend dc to home once medically stable per physician.       Follow Up Recommendations  No OT follow up;Supervision/Assistance - 24 hour    Equipment Recommendations  None recommended by OT    Recommendations for Other Services PT consult     Precautions / Restrictions Precautions Precautions: Fall Precaution Comments: Watch O2 Restrictions Weight Bearing Restrictions: No      Mobility Bed Mobility               General bed mobility comments: In recliner upon arrival  Transfers Overall transfer level: Needs assistance Equipment used: Rolling walker (2 wheeled) Transfers: Sit to/from Stand Sit to Stand: Supervision         General transfer comment: Supervision for safety    Balance Overall balance assessment: Mild deficits observed, not formally tested                                         ADL either performed or assessed with clinical judgement   ADL Overall ADL's : Needs assistance/impaired Eating/Feeding: Set up;Sitting   Grooming: Set up;Supervision/safety;Sitting   Upper Body Bathing: Set up;Supervision/ safety;Sitting   Lower Body Bathing: Supervison/ safety;Sit to/from stand   Upper Body Dressing : Supervision/safety;Set up;Sitting   Lower Body  Dressing: Supervision/safety;Sit to/from stand Lower Body Dressing Details (indicate cue type and reason): Pt able to bring ankles to knees for donning socks. Supervision for safety during transition into standing Toilet Transfer: RW;Supervision/safety;Ambulation(simulated to recliner)           Functional mobility during ADLs: Rolling walker;Supervision/safety;Min guard General ADL Comments: Pt presenting with decreased acitivty tolerance as seen by fatigue and SpO2 dropping to 85%. Initiated education on [facility] and will need further education     Vision         Perception     Praxis      Pertinent Vitals/Pain Pain Assessment: No/denies pain     Hand Dominance Right   Extremity/Trunk Assessment Upper Extremity Assessment Upper Extremity Assessment: Overall WFL for tasks assessed   Lower Extremity Assessment Lower Extremity Assessment: Defer to PT evaluation   Cervical / Trunk Assessment Cervical / Trunk Assessment: Normal   Communication Communication Communication: No difficulties   Cognition Arousal/Alertness: Awake/alert Behavior During Therapy: WFL for tasks assessed/performed Overall Cognitive Status: Within Functional Limits for tasks assessed                                     General Comments  SpO2 ranging between 96-85% on 4L during functional mobility. Cues for purse lip breathing and pt able to take standing rest breaks for recovery.     Exercises Exercises: Other exercises Other Exercises Other  Exercises: Reviewed IS and flutter valve   Shoulder Instructions      Home Living Family/patient expects to be discharged to:: Private residence Living Arrangements: Spouse/significant other Available Help at Discharge: Family;Available 24 hours/day(Daughter plansto come and assist pt and husband) Type of Home: House Home Access: Stairs to enter;Ramped entrance Entrance Stairs-Number of Steps: one, and also has a ramp Entrance Stairs-Rails:  Right Home Layout: One level     Bathroom Shower/Tub: Occupational psychologist: Handicapped height Bathroom Accessibility: Yes   Home Equipment: Grab bars - tub/shower;Shower seat;Walker - 2 wheels   Additional Comments: States she has a RW, but that it belongs to spouse from when he had heart surgery.      Prior Functioning/Environment Level of Independence: Independent        Comments: ADLs and IADLs        OT Problem List: Decreased activity tolerance;Impaired balance (sitting and/or standing);Decreased knowledge of use of DME or AE;Decreased knowledge of precautions;Cardiopulmonary status limiting activity      OT Treatment/Interventions: Self-care/ADL training;Therapeutic exercise;Energy conservation;DME and/or AE instruction;Therapeutic activities;Patient/family education    OT Goals(Current goals can be found in the care plan section) Acute Rehab OT Goals Patient Stated Goal: "I hope I can get better and go home with my husband." OT Goal Formulation: With patient Time For Goal Achievement: 08/02/19 Potential to Achieve Goals: Good  OT Frequency: Min 2X/week   Barriers to D/C:            Co-evaluation              AM-PAC OT "6 Clicks" Daily Activity     Outcome Measure Help from another person eating meals?: None Help from another person taking care of personal grooming?: None Help from another person toileting, which includes using toliet, bedpan, or urinal?: None Help from another person bathing (including washing, rinsing, drying)?: None Help from another person to put on and taking off regular upper body clothing?: None Help from another person to put on and taking off regular lower body clothing?: None 6 Click Score: 24   End of Session Equipment Utilized During Treatment: Oxygen(4L) Nurse Communication: Mobility status  Activity Tolerance: Patient tolerated treatment well Patient left: in chair;with call bell/phone within reach  OT  Visit Diagnosis: Unsteadiness on feet (R26.81);Other abnormalities of gait and mobility (R26.89);Muscle weakness (generalized) (M62.81)                Time: [id number] OT Time Calculation (min): 22 min Charges:  OT General Charges $OT Visit: 1 Visit OT Evaluation $OT Eval Moderate Complexity: [place], OTR/L Acute Rehab Pager: [phone] Office: [place] 07/19/2019, 3:21 PM

## 2019-07-19 NOTE — Progress Notes (Addendum)
ANTICOAGULATION CONSULT NOTE - Follow Up Consult  Pharmacy Consult for Heparin Indication: chest pain/ACS  Allergies  Allergen Reactions  . Codeine Nausea And Vomiting  . Tetracyclines & Related Rash  . Claritin [Loratadine] Hives    Patient Measurements: Height: 5\' 4"  (162.6 cm) Weight: 213 lb (96.6 kg) IBW/kg (Calculated) : 54.7 Heparin Dosing Weight: 76.8 kg  Vital Signs: Temp: 97.9 F (36.6 C) (01/15 0006) Temp Source: Oral (01/15 0006) BP: 117/53 (01/15 0006) Pulse Rate: 76 (01/15 0006)  Labs: Recent Labs    07/16/19 1854 07/17/19 0132 07/18/19 0147 07/18/19 0147 07/18/19 0650 07/18/19 1000 07/18/19 1212 07/18/19 2033 07/19/19 0155  HGB 12.3  --  11.9*  --   --   --   --   --  11.7*  HCT 37.0  --  35.0*  --   --   --   --   --  34.6*  PLT 398  --  390  --   --   --   --   --  455*  HEPARINUNFRC  --    < > <0.10*   < >  --  <0.10*  --  <0.10* 0.41  CREATININE 0.72  --  0.56  --   --   --   --   --  0.67  TROPONINIHS 109*   < > 1,664*  --  1,170*  --  1,218*  --   --    < > = values in this interval not displayed.    Estimated Creatinine Clearance: 71.7 mL/min (by C-G formula based on SCr of 0.67 mg/dL).  Assessment: 74 y.o. F presents with COVID 19. Found to have elevated trop to 1961. D-dimer also 11.84. To begin heparin for ACS. Cardiology consulted. No AC PTA. Per TRH, heparin x 48 h total for NSTEMI.   07/19/19 0420   Fifth  heparin level: 0.41 IU/mL, now within therapeutic goal range   Heparin  level was drawn ~2h early, so may be seeing effects of last bolus  H/H low stable, D-Dimer  now 6.35mcg/mL-FEU   RN  reports no issues with bleeding or infusion site  Goal of Therapy:  Heparin level 0.3-0.7 units/ml Monitor platelets by anticoagulation protocol: Yes   Plan:   Continue heparin infusion rate at 1900 units/hr  F/U Heparin D/C on 1/15 to decide if another HL is needed  Daily heparin level and CBC  Monitor for s/s of  bleeding  Despina Pole, Pharm. D. Clinical Pharmacist 07/19/2019 4:21 AM

## 2019-07-19 NOTE — Progress Notes (Signed)
Pt stable with no signs of distress. Report given to oncoming nurse. Safety maintained.  

## 2019-07-20 DIAGNOSIS — E785 Hyperlipidemia, unspecified: Secondary | ICD-10-CM

## 2019-07-20 DIAGNOSIS — I214 Non-ST elevation (NSTEMI) myocardial infarction: Secondary | ICD-10-CM

## 2019-07-20 LAB — COMPREHENSIVE METABOLIC PANEL
ALT: 43 U/L (ref 0–44)
AST: 38 U/L (ref 15–41)
Albumin: 2.2 g/dL — ABNORMAL LOW (ref 3.5–5.0)
Alkaline Phosphatase: 94 U/L (ref 38–126)
Anion gap: 7 (ref 5–15)
BUN: 34 mg/dL — ABNORMAL HIGH (ref 8–23)
CO2: 29 mmol/L (ref 22–32)
Calcium: 7.9 mg/dL — ABNORMAL LOW (ref 8.9–10.3)
Chloride: 100 mmol/L (ref 98–111)
Creatinine, Ser: 0.65 mg/dL (ref 0.44–1.00)
GFR calc Af Amer: >60 mL/min (ref >60)
GFR calc non Af Amer: >60 mL/min (ref >60)
Glucose, Bld: 139 mg/dL — ABNORMAL HIGH (ref 70–99)
Potassium: 5.4 mmol/L — ABNORMAL HIGH (ref 3.5–5.1)
Sodium: 136 mmol/L (ref 135–145)
Total Bilirubin: 0.5 mg/dL (ref 0.3–1.2)
Total Protein: 5.5 g/dL — ABNORMAL LOW (ref 6.5–8.1)

## 2019-07-20 LAB — FERRITIN: Ferritin: 463 ng/mL — ABNORMAL HIGH (ref 11–307)

## 2019-07-20 LAB — CBC
HCT: 35.3 % — ABNORMAL LOW (ref 36.0–46.0)
Hemoglobin: 11.7 g/dL — ABNORMAL LOW (ref 12.0–15.0)
MCH: 29.3 pg (ref 26.0–34.0)
MCHC: 33.1 g/dL (ref 30.0–36.0)
MCV: 88.3 fL (ref 80.0–100.0)
Platelets: 495 10*3/uL — ABNORMAL HIGH (ref 150–400)
RBC: 4 MIL/uL (ref 3.87–5.11)
RDW: 15.5 % (ref 11.5–15.5)
WBC: 8.1 10*3/uL (ref 4.0–10.5)
nRBC: 0 % (ref 0.0–0.2)

## 2019-07-20 LAB — C-REACTIVE PROTEIN: CRP: 6.7 mg/dL — ABNORMAL HIGH (ref <1.0)

## 2019-07-20 LAB — D-DIMER, QUANTITATIVE: D-Dimer, Quant: 3.66 ug/mL-FEU — ABNORMAL HIGH (ref 0.00–0.50)

## 2019-07-20 NOTE — Plan of Care (Signed)
  Problem: Respiratory: Goal: Will maintain a patent airway Outcome: Progressing   Problem: Coping: Goal: Psychosocial and spiritual needs will be supported Outcome: Progressing   

## 2019-07-20 NOTE — Progress Notes (Signed)
    07/20/19 1400  Oxygen Therapy  O2 Device HFNC  O2 Flow Rate (L/min) 2.5 L/min  Patient oxygen weaned to 2.5; patients )2 saturation was 100% on 3L, Will continue to monitor patient and wean oxygen as appropriate.

## 2019-07-20 NOTE — Progress Notes (Signed)
PROGRESS NOTE    Jessica Lewis  [id number] DOB: 1946/04/11 DOA: 07/16/2019 PCP: Patient, No Pcp Per    Brief Narrative:  74 year old female who presented with dyspnea, cough and fever. She does have significant past medical history for COPD. Patient reported progressive dyspnea, cough, fevers and malaise for about 2 weeks. On her initial physical examination her oximetry was 75% on room air, respiratory rate 30, heart rate 110, her lungs had scattered rhonchi, heart S1-S2 present, rhythmic, abdomen soft, no lower extremity edema. Sodium 134, potassium 3.6, chloride 100, bicarb 25, glucose 131, BUN 20, creatinine 0.72,troponin I(HS)109,white count 15.0, hemoglobin 12.3, hematocrit 37.0, platelets 398.Chest radiograph with bilateral upper lobes interstitial infiltrates.Initial EKG rate 112 bpm, normal axis, normal intervals, sinus rhythm, ST elevation in lead III, aVF, V5 and V6, small Q-wave lead II, III and aVF, no significant T wave abnormalities. Follow-up EKG with lower heart rate with resolution of ST elevations.  Patient was admitted to the hospital working diagnosis of acute hypoxic respiratory failure due to SARS COVID-19 viral pneumonia.  Patient with worsening troponin and apical LV hypokinesis, medical therapy for NSTEMI / MI type 2. Complicated with cardiogenic pulmonary edema. Further work up negative for PE per CT chest.   Patient has responded well to medical therapy including diuresis with furosemide, with decreased oxygen requirements.   Troponin I trending down, no chest pain or acute ischemic changes in EKG. Patient completed 48 H of full anticoagulation with heparin drip. For now will continue medical therapy with asa, B blockade and statin, no ace inh due to risk of hypotension. Will need outpatient follow up echocardiogram.    Assessment & Plan:   Principal Problem:   Pneumonia due to COVID-19 virus Active Problems:   Chronic obstructive pulmonary  disease, unspecified ([place])   Acute respiratory failure with hypoxia (HCC)   NSTEMI (non-ST elevated myocardial infarction) ([place])   Dyslipidemia   1. Acute hypoxic respiratory failure due to SARS COVID 19 viral pneumonia.Sp tocilizumab 07/19/19, Remdesivir #5/5.   RR: 20  Pulse oxymetry: 96%  Fi02: 3 L/ min per HFNC.  COVID-19 Labs  Recent Labs    07/18/19 0147 07/19/19 0155 07/20/19 0418  DDIMER 8.02* 6.13* 3.66*  FERRITIN 815* 660* 463*  CRP 26.8* 13.8* 6.7*    Lab Results  Component Value Date   SARSCOV2NAA POSITIVE (A) 07/16/2019    Inflammatory markers are now trending down.  Continue with systemic steroids with dexamethasone IV #6. On antitussive agents, bronchodilators and airway clearing techniques with flutter valve and incentive spirometer.   Out of bed to chair and physical/ occupational therapy evaluations.    2. NSTEMI/ MI type 2/ with cardiogenic pulmonary edema, acute. echocardiogram with apical hypokinesis but preserved LV systolic function. CT chest negative for pulmonary embolism.   Today patient is more euvolemic, urine output over last 25 H is 1,300 ml, decreased oxygen requirements, will hold on further diuresis for now. Will continue metoprolol, statin and aspirin.    3. COPD with acute exacerbation. Continue with bronchodilator, inhaled and systemic steroids. Dyspnea seems to be improving.   4. Dyslipidemia. Continue with atorvastatin.    DVT prophylaxis:enoxaparin  Code Status:full Family Communication: Disposition Plan/ discharge barriers:pending completion of therapy and improvement in oxygenation.  Consultants:   Cardiology   Subjective: Patient continue to improve dyspnea, has been out of bed to the chair, symptoms not yet back to baseline, no nausea or vomiting, no chest pain, no nausea or vomiting.   Objective: Vitals:  07/20/19 0000 07/20/19 0100 07/20/19 0400 07/20/19 0600  BP: (!) 112/56  (!) 113/42 (!)  110/99  Pulse: 69 62    Resp: (!) 21 (!) 22    Temp: 98.4 F (36.9 C)  98.4 F (36.9 C)   TempSrc: Oral  Oral   SpO2: 98% 100%    Weight:      Height:        Intake/Output Summary (Last 24 hours) at 07/20/2019 0809 Last data filed at 07/19/2019 1600 Gross per 24 hour  Intake 720 ml  Output 1300 ml  Net -580 ml   Filed Weights   07/16/19 1745  Weight: 96.6 kg    Examination:   General: Not in pain or dyspnea, deconditioned  Neurology: Awake and alert, non focal  E ENT: no pallor, no icterus, oral mucosa moist Cardiovascular: No JVD. S1-S2 present, rhythmic, no gallops, rubs, or murmurs. Trace bilateral lower extremity edema. Pulmonary: positive breath sounds bilaterally. Gastrointestinal. Abdomen with no organomegaly, non tender, no rebound or guarding Skin. No rashes Musculoskeletal: no joint deformities     Data Reviewed: I have personally reviewed following labs and imaging studies  CBC: Recent Labs  Lab 07/16/19 1854 07/18/19 0147 07/19/19 0155 07/20/19 0418  WBC 15.0* 10.5 11.5* 8.1  NEUTROABS 11.8*  --   --   --   HGB 12.3 11.9* 11.7* 11.7*  HCT 37.0 35.0* 34.6* 35.3*  MCV 88.1 86.6 86.9 88.3  PLT 398 390 455* [id number]*   Basic Metabolic Panel: Recent Labs  Lab 07/16/19 1854 07/18/19 0147 07/19/19 0155 07/20/19 0418  NA 134* 138 140 136  K 3.6 5.3* 4.8 5.4*  CL 100 102 102 100  CO2 25 25 29 29   GLUCOSE 131* 126* 150* 139*  BUN 20 24* 33* 34*  CREATININE 0.72 0.56 0.67 0.65  CALCIUM 8.1* 7.9* 8.0* 7.9*  MG  --  2.1  --   --    GFR: Estimated Creatinine Clearance: 71.7 mL/min (by C-G formula based on SCr of 0.65 mg/dL). Liver Function Tests: Recent Labs  Lab 07/16/19 1854 07/18/19 0147 07/19/19 0155 07/20/19 0418  AST 49* 52* 31 38  ALT 43 41 37 43  ALKPHOS 129* 109 97 94  BILITOT 0.6 0.2* 0.3 0.5  PROT 6.8 6.1* 5.8* 5.5*  ALBUMIN 2.4* 2.0* 2.0* 2.2*   No results for input(s): LIPASE, AMYLASE in the last 168 hours. No results for  input(s): AMMONIA in the last 168 hours. Coagulation Profile: No results for input(s): INR, PROTIME in the last 168 hours. Cardiac Enzymes: No results for input(s): CKTOTAL, CKMB, CKMBINDEX, TROPONINI in the last 168 hours. BNP (last 3 results) No results for input(s): PROBNP in the last 8760 hours. HbA1C: Recent Labs    07/18/19 0147  HGBA1C 5.4   CBG: No results for input(s): GLUCAP in the last 168 hours. Lipid Profile: Recent Labs    07/18/19 0147  CHOL 113  HDL 11*  LDLCALC 78  TRIG 118  CHOLHDL 10.3   Thyroid Function Tests: No results for input(s): TSH, T4TOTAL, FREET4, T3FREE, THYROIDAB in the last 72 hours. Anemia Panel: Recent Labs    07/18/19 0147 07/19/19 0155  FERRITIN 64* 660*      Radiology Studies: I have reviewed all of the imaging during this hospital visit personally     Scheduled Meds: . aspirin EC  81 mg Oral Daily  . atorvastatin  20 mg Oral q1800  . dexamethasone (DECADRON) injection  6 mg Intravenous Q24H  . enoxaparin (  LOVENOX) injection  50 mg Subcutaneous Q12H  . fluticasone furoate-vilanterol  1 puff Inhalation Daily   And  . umeclidinium bromide  1 puff Inhalation Daily  . metoprolol tartrate  12.5 mg Oral BID  . sodium chloride flush  3 mL Intravenous Q12H   Continuous Infusions:   LOS: 3 days        Kishia Shackett Gerome Apley, MD

## 2019-07-21 LAB — FERRITIN: Ferritin: 475 ng/mL — ABNORMAL HIGH (ref 11–307)

## 2019-07-21 LAB — CBC
HCT: 38.6 % (ref 36.0–46.0)
Hemoglobin: 12.9 g/dL (ref 12.0–15.0)
MCH: 29.7 pg (ref 26.0–34.0)
MCHC: 33.4 g/dL (ref 30.0–36.0)
MCV: 88.7 fL (ref 80.0–100.0)
Platelets: 512 10*3/uL — ABNORMAL HIGH (ref 150–400)
RBC: 4.35 MIL/uL (ref 3.87–5.11)
RDW: 15.7 % — ABNORMAL HIGH (ref 11.5–15.5)
WBC: 7.5 10*3/uL (ref 4.0–10.5)
nRBC: 0 % (ref 0.0–0.2)

## 2019-07-21 LAB — COMPREHENSIVE METABOLIC PANEL
ALT: 52 U/L — ABNORMAL HIGH (ref 0–44)
AST: 40 U/L (ref 15–41)
Albumin: 2.4 g/dL — ABNORMAL LOW (ref 3.5–5.0)
Alkaline Phosphatase: 95 U/L (ref 38–126)
Anion gap: 8 (ref 5–15)
BUN: 28 mg/dL — ABNORMAL HIGH (ref 8–23)
CO2: 29 mmol/L (ref 22–32)
Calcium: 8.3 mg/dL — ABNORMAL LOW (ref 8.9–10.3)
Chloride: 99 mmol/L (ref 98–111)
Creatinine, Ser: 0.61 mg/dL (ref 0.44–1.00)
GFR calc Af Amer: >60 mL/min (ref >60)
GFR calc non Af Amer: >60 mL/min (ref >60)
Glucose, Bld: 148 mg/dL — ABNORMAL HIGH (ref 70–99)
Potassium: 5.3 mmol/L — ABNORMAL HIGH (ref 3.5–5.1)
Sodium: 136 mmol/L (ref 135–145)
Total Bilirubin: 0.5 mg/dL (ref 0.3–1.2)
Total Protein: 6 g/dL — ABNORMAL LOW (ref 6.5–8.1)

## 2019-07-21 LAB — C-REACTIVE PROTEIN: CRP: 4 mg/dL — ABNORMAL HIGH (ref <1.0)

## 2019-07-21 LAB — D-DIMER, QUANTITATIVE: D-Dimer, Quant: 3.53 ug/mL-FEU — ABNORMAL HIGH (ref 0.00–0.50)

## 2019-07-21 MED ORDER — ENOXAPARIN SODIUM 60 MG/0.6ML ~~LOC~~ SOLN
50.0000 mg | SUBCUTANEOUS | Status: DC
  Administered 2019-07-22: 50 mg via SUBCUTANEOUS
  Filled 2019-07-21 (×2): qty 0.6

## 2019-07-21 MED ORDER — SODIUM ZIRCONIUM CYCLOSILICATE 10 G PO PACK
10.0000 g | PACK | Freq: Two times a day (BID) | ORAL | Status: AC
  Administered 2019-07-21 (×2): 10 g via ORAL
  Filled 2019-07-21 (×3): qty 1

## 2019-07-21 NOTE — Plan of Care (Signed)
Pt transferred from PCU, oriented to unit & call bell system. A&Ox4. VSS, SpO2 >88% on 2L [place]. No c/o pain throughout shift. Ambulating to bathroom independently w/o issue.   Will report to night shift  Jessica Lewis A Jessica Lewis    Problem: Education: Goal: Knowledge of risk factors and measures for prevention of condition will improve Outcome: Progressing   Problem: Coping: Goal: Psychosocial and spiritual needs will be supported Outcome: Progressing   Problem: Respiratory: Goal: Will maintain a patent airway Outcome: Progressing Goal: Complications related to the disease process, condition or treatment will be avoided or minimized Outcome: Progressing

## 2019-07-21 NOTE — Progress Notes (Signed)
PROGRESS NOTE    AUDIA FUNARO  [id number] DOB: [birth year] DOA: 07/16/2019 PCP: Patient, No Pcp Per    Brief Narrative:  74 year old female who presented with dyspnea, cough and fever. She does have significant past medical history for COPD. Patient reported progressive dyspnea, cough, fevers and malaise for about 2 weeks. On her initial physical examination her oximetry was 75% on room air, respiratory rate 30, heart rate 110, her lungs had scattered rhonchi, heart S1-S2 present, rhythmic, abdomen soft, no lower extremity edema. Sodium 134, potassium 3.6, chloride 100, bicarb 25, glucose 131, BUN 20, creatinine 0.72,troponin I(HS)109,white count 15.0, hemoglobin 12.3, hematocrit 37.0, platelets 398.Chest radiograph with bilateral upper lobes interstitial infiltrates.Initial EKG rate 112 bpm, normal axis, normal intervals, sinus rhythm, ST elevation in lead III, aVF, V5 and V6, small Q-wave lead II, III and aVF, no significant T wave abnormalities. Follow-up EKG with lower heart rate with resolution of ST elevations.  Patient was admitted to the hospital working diagnosis of acute hypoxic respiratory failure due to SARS COVID-19 viral pneumonia.  Patient with worsening troponin and apical LV hypokinesis, medical therapy for NSTEMI / MI type 2. Complicated with cardiogenic pulmonary edema. Further work up negative for PE per CT chest.   Patient has responded well to medical therapy including diuresis with furosemide, with decreased oxygen requirements.   Troponin I trending down, no chest pain or acute ischemic changes in EKG. Patient completed 48 H of full anticoagulation with heparin drip. For now will continue medical therapy with asa, B blockade and statin, no ace inh due to risk of hypotension. Will need outpatient follow up echocardiogram.   Continue to improve oxygenation, plan for dc home in am.   Assessment & Plan:   Principal Problem:   Pneumonia due to COVID-19  virus Active Problems:   Chronic obstructive pulmonary disease, unspecified ([place])   Acute respiratory failure with hypoxia (HCC)   NSTEMI (non-ST elevated myocardial infarction) ([place])   Dyslipidemia   1. Acute hypoxic respiratory failure due to SARS COVID 19 viral pneumonia.Sp tocilizumab 07/19/19, Remdesivir #5/5.   RR: 15  Pulse oxymetry: 100%  Fi02: 2 L/ min per [place]  COVID-19 Labs  Recent Labs    07/19/19 0155 07/20/19 0418 07/21/19 0344  DDIMER 6.13* 3.66* 3.53*  FERRITIN 660* 463* 475*  CRP 13.8* 6.7* 4.0*    Lab Results  Component Value Date   SARSCOV2NAA POSITIVE (A) 07/16/2019     Inflammatory markers continue to trend down.  Tolerating well systemic steroids with dexamethasone IV #7.Continue with antitussive agents, bronchodilators and airway clearing techniques with flutter valve and incentive spirometer.  Patient will need home health, plan for dc in am.   2. NSTEMI/ MI type 2/ with cardiogenic pulmonary edema, acute. echocardiogram with apical hypokinesis but preserved LV systolic function. CT chest negative for pulmonary embolism.   Urine output over last 24 H is 700 ml, no diuresis needed today. Continue with metoprolol, statin and aspirin. Will need outpatient echocardiogram in 3 mo.   3. COPD with acute exacerbation.Symptoms improving with bronchodilator,inhaled and systemic steroids.  4. Dyslipidemia.On atorvastatin.   5. New hyperkalemia. Will add sodium zirconium x 2 doses 10 g, and will follow on electrolytes in am, renal function is preserved with serum cr at 0.61   DVT prophylaxis:enoxaparin Code Status:full Family Communication: no family at the bedside.  Disposition Plan/ discharge barriers:discharge in am.   Consultants:  Cardiology   Subjective: Patient's symptoms continue to improve, not yet back to baseline, no  nausea or vomiting, no chest pain. Continue to have polyuria.   Objective: Vitals:    07/21/19 0400 07/21/19 0405 07/21/19 0500 07/21/19 0733  BP:  (!) 118/50  (!) 124/47  Pulse: 65 71 60 72  Resp: (!) 22 19 (!) 25 15  Temp:  97.9 F (36.6 C)  97.7 F (36.5 C)  TempSrc:  Oral  Oral  SpO2: 100% 100% 100% 100%  Weight:      Height:        Intake/Output Summary (Last 24 hours) at 07/21/2019 0842 Last data filed at 07/21/2019 0734 Gross per 24 hour  Intake 540 ml  Output 900 ml  Net -360 ml   Filed Weights   07/16/19 1745  Weight: 96.6 kg    Examination:   General: Not in pain or dyspnea. Deconditioned  Neurology: Awake and alert, non focal  E ENT: no pallor, no icterus, oral mucosa moist Cardiovascular: No JVD. S1-S2 present, rhythmic, no gallops, rubs, or murmurs. Trace lower extremity edema. Pulmonary: positive  breath sounds bilaterally. Gastrointestinal. Abdomen with no organomegaly, non tender, no rebound or guarding Skin. No rashes Musculoskeletal: no joint deformities     Data Reviewed: I have personally reviewed following labs and imaging studies  CBC: Recent Labs  Lab 07/16/19 1854 07/18/19 0147 07/19/19 0155 07/20/19 0418 07/21/19 0344  WBC 15.0* 10.5 11.5* 8.1 7.5  NEUTROABS 11.8*  --   --   --   --   HGB 12.3 11.9* 11.7* 11.7* 12.9  HCT 37.0 35.0* 34.6* 35.3* 38.6  MCV 88.1 86.6 86.9 88.3 88.7  PLT 398 390 455* 495* [id number]*   Basic Metabolic Panel: Recent Labs  Lab 07/16/19 1854 07/18/19 0147 07/19/19 0155 07/20/19 0418 07/21/19 0344  NA 134* 138 140 136 136  K 3.6 5.3* 4.8 5.4* 5.3*  CL 100 102 102 100 99  CO2 25 25 29 29 29   GLUCOSE 131* 126* 150* 139* 148*  BUN 20 24* 33* 34* 28*  CREATININE 0.72 0.56 0.67 0.65 0.61  CALCIUM 8.1* 7.9* 8.0* 7.9* 8.3*  MG  --  2.1  --   --   --    GFR: Estimated Creatinine Clearance: 71.7 mL/min (by C-G formula based on SCr of 0.61 mg/dL). Liver Function Tests: Recent Labs  Lab 07/16/19 1854 07/18/19 0147 07/19/19 0155 07/20/19 0418 07/21/19 0344  AST 49* 52* 31 38 40  ALT 43 41  37 43 52*  ALKPHOS 129* 109 97 94 95  BILITOT 0.6 0.2* 0.3 0.5 0.5  PROT 6.8 6.1* 5.8* 5.5* 6.0*  ALBUMIN 2.4* 2.0* 2.0* 2.2* 2.4*   No results for input(s): LIPASE, AMYLASE in the last 168 hours. No results for input(s): AMMONIA in the last 168 hours. Coagulation Profile: No results for input(s): INR, PROTIME in the last 168 hours. Cardiac Enzymes: No results for input(s): CKTOTAL, CKMB, CKMBINDEX, TROPONINI in the last 168 hours. BNP (last 3 results) No results for input(s): PROBNP in the last 8760 hours. HbA1C: No results for input(s): HGBA1C in the last 72 hours. CBG: No results for input(s): GLUCAP in the last 168 hours. Lipid Profile: No results for input(s): CHOL, HDL, LDLCALC, TRIG, CHOLHDL, LDLDIRECT in the last 72 hours. Thyroid Function Tests: No results for input(s): TSH, T4TOTAL, FREET4, T3FREE, THYROIDAB in the last 72 hours. Anemia Panel: Recent Labs    07/20/19 0418 07/21/19 0344  FERRITIN 463* 475*      Radiology Studies: I have reviewed all of the imaging during this hospital visit personally  Scheduled Meds: . aspirin EC  81 mg Oral Daily  . atorvastatin  20 mg Oral q1800  . dexamethasone (DECADRON) injection  6 mg Intravenous Q24H  . enoxaparin (LOVENOX) injection  50 mg Subcutaneous Q12H  . fluticasone furoate-vilanterol  1 puff Inhalation Daily   And  . umeclidinium bromide  1 puff Inhalation Daily  . metoprolol tartrate  12.5 mg Oral BID  . sodium chloride flush  3 mL Intravenous Q12H   Continuous Infusions:   LOS: 4 days        Annie Roseboom Gerome Apley, MD

## 2019-07-21 NOTE — Progress Notes (Signed)
Patient deferred family update. 

## 2019-07-22 LAB — LIPID PANEL
Cholesterol: 118 mg/dL (ref 0–200)
HDL: 20 mg/dL — ABNORMAL LOW (ref >40)
LDL Cholesterol: 61 mg/dL (ref 0–99)
Total CHOL/HDL Ratio: 5.9 RATIO
Triglycerides: 184 mg/dL — ABNORMAL HIGH (ref <150)
VLDL: 37 mg/dL (ref 0–40)

## 2019-07-22 LAB — CULTURE, BLOOD (ROUTINE X 2)
Culture: NO GROWTH
Culture: NO GROWTH
Special Requests: ADEQUATE
Special Requests: ADEQUATE

## 2019-07-22 LAB — COMPREHENSIVE METABOLIC PANEL
ALT: 40 U/L (ref 0–44)
AST: 25 U/L (ref 15–41)
Albumin: 2.3 g/dL — ABNORMAL LOW (ref 3.5–5.0)
Alkaline Phosphatase: 78 U/L (ref 38–126)
Anion gap: 6 (ref 5–15)
BUN: 28 mg/dL — ABNORMAL HIGH (ref 8–23)
CO2: 30 mmol/L (ref 22–32)
Calcium: 8.2 mg/dL — ABNORMAL LOW (ref 8.9–10.3)
Chloride: 101 mmol/L (ref 98–111)
Creatinine, Ser: 0.63 mg/dL (ref 0.44–1.00)
GFR calc Af Amer: >60 mL/min (ref >60)
GFR calc non Af Amer: >60 mL/min (ref >60)
Glucose, Bld: 163 mg/dL — ABNORMAL HIGH (ref 70–99)
Potassium: 5.5 mmol/L — ABNORMAL HIGH (ref 3.5–5.1)
Sodium: 137 mmol/L (ref 135–145)
Total Bilirubin: 0.8 mg/dL (ref 0.3–1.2)
Total Protein: 5.4 g/dL — ABNORMAL LOW (ref 6.5–8.1)

## 2019-07-22 LAB — BASIC METABOLIC PANEL
Anion gap: 9 (ref 5–15)
BUN: 26 mg/dL — ABNORMAL HIGH (ref 8–23)
CO2: 27 mmol/L (ref 22–32)
Calcium: 8.5 mg/dL — ABNORMAL LOW (ref 8.9–10.3)
Chloride: 100 mmol/L (ref 98–111)
Creatinine, Ser: 0.7 mg/dL (ref 0.44–1.00)
GFR calc Af Amer: >60 mL/min (ref >60)
GFR calc non Af Amer: >60 mL/min (ref >60)
Glucose, Bld: 133 mg/dL — ABNORMAL HIGH (ref 70–99)
Potassium: 4.1 mmol/L (ref 3.5–5.1)
Sodium: 136 mmol/L (ref 135–145)

## 2019-07-22 MED ORDER — INSULIN ASPART 100 UNIT/ML IV SOLN
10.0000 [IU] | Freq: Once | INTRAVENOUS | Status: AC
  Administered 2019-07-22: 10 [IU] via INTRAVENOUS

## 2019-07-22 MED ORDER — CALCIUM GLUCONATE-NACL 1-0.675 GM/50ML-% IV SOLN
1.0000 g | Freq: Once | INTRAVENOUS | Status: AC
  Administered 2019-07-22: 1000 mg via INTRAVENOUS
  Filled 2019-07-22: qty 50

## 2019-07-22 MED ORDER — SODIUM ZIRCONIUM CYCLOSILICATE 10 G PO PACK
10.0000 g | PACK | Freq: Once | ORAL | Status: AC
  Administered 2019-07-22: 10 g via ORAL
  Filled 2019-07-22: qty 1

## 2019-07-22 MED ORDER — METOPROLOL TARTRATE 25 MG PO TABS: 12.5000 mg | ORAL_TABLET | Freq: Two times a day (BID) | ORAL | 0 refills | Status: DC

## 2019-07-22 MED ORDER — ATORVASTATIN CALCIUM 20 MG PO TABS: 10.0000 mg | ORAL_TABLET | Freq: Every day | ORAL | 0 refills | Status: DC

## 2019-07-22 MED ORDER — ASPIRIN 81 MG PO TBEC: 81.0000 mg | DELAYED_RELEASE_TABLET | Freq: Every day | ORAL | 0 refills | Status: AC

## 2019-07-22 MED ORDER — DEXTROSE 50 % IV SOLN
1.0000 | Freq: Once | INTRAVENOUS | Status: AC
  Administered 2019-07-22: 50 mL via INTRAVENOUS
  Filled 2019-07-22: qty 50

## 2019-07-22 NOTE — Discharge Instructions (Signed)
COVID-19 Frequently Asked Questions COVID-19 (coronavirus disease) is an infection that is caused by a large family of viruses. Some viruses cause illness in people and others cause illness in animals like camels, cats, and bats. In some cases, the viruses that cause illness in animals can spread to humans. Where did the coronavirus come from? In December 2019, [place] told the World Health Organization (WHO) of several cases of lung disease (human respiratory illness). These cases were linked to an open seafood and livestock market in the city of [place]. The link to the seafood and livestock market suggests that the virus may have spread from animals to humans. However, since that first outbreak in December, the virus has also been shown to spread from person to person. What is the name of the disease and the virus? Disease name Early on, this disease was called novel coronavirus. This is because scientists determined that the disease was caused by a new (novel) respiratory virus. The World Health Organization (WHO) has now named the disease COVID-19, or coronavirus disease. Virus name The virus that causes the disease is called severe acute respiratory syndrome coronavirus 2 (SARS-CoV-2). More information on disease and virus naming World Health Organization (WHO): www.who.int/emergencies/diseases/novel-coronavirus-2019/technical-guidance/naming-the-coronavirus-disease-(covid-2019)-and-the-virus-that-causes-it Who is at risk for complications from coronavirus disease? Some people may be at higher risk for complications from coronavirus disease. This includes older adults and people who have chronic diseases, such as heart disease, diabetes, and lung disease. If you are at higher risk for complications, take these extra precautions:  Stay home as much as possible.  Avoid social gatherings and travel.  Avoid close contact with others. Stay at least 6 ft (2 m) away from others, if possible.  Wash  your hands often with soap and water for at least 20 seconds.  Avoid touching your face, mouth, nose, or eyes.  Keep supplies on hand at home, such as food, medicine, and cleaning supplies.  If you must go out in public, wear a cloth face covering or face mask. Make sure your mask covers your nose and mouth. How does coronavirus disease spread? The virus that causes coronavirus disease spreads easily from person to person (is contagious). You may catch the virus by:  Breathing in droplets from an infected person. Droplets can be spread by a person breathing, speaking, singing, coughing, or sneezing.  Touching something, like a table or a doorknob, that was exposed to the virus (contaminated) and then touching your mouth, nose, or eyes. Can I get the virus from touching surfaces or objects? There is still a lot that we do not know about the virus that causes coronavirus disease. Scientists are basing a lot of information on what they know about similar viruses, such as:  Viruses cannot generally survive on surfaces for long. They need a human body (host) to survive.  It is more likely that the virus is spread by close contact with people who are sick (direct contact), such as through: ? Shaking hands or hugging. ? Breathing in respiratory droplets that travel through the air. Droplets can be spread by a person breathing, speaking, singing, coughing, or sneezing.  It is less likely that the virus is spread when a person touches a surface or object that has the virus on it (indirect contact). The virus may be able to enter the body if the person touches a surface or object and then touches his or her face, eyes, nose, or mouth. Can a person spread the virus without having symptoms of the disease?   It may be possible for the virus to spread before a person has symptoms of the disease, but this is most likely not the main way the virus is spreading. It is more likely for the virus to spread by  being in close contact with people who are sick and breathing in the respiratory droplets spread by a person breathing, speaking, singing, coughing, or sneezing. What are the symptoms of coronavirus disease? Symptoms vary from person to person and can range from mild to severe. Symptoms may include:  Fever or chills.  Cough.  Difficulty breathing or feeling short of breath.  Headaches, body aches, or muscle aches.  Runny or stuffy (congested) nose.  Sore throat.  New loss of taste or smell.  Nausea, vomiting, or diarrhea. These symptoms can appear anywhere from 2 to 14 days after you have been exposed to the virus. Some people may not have any symptoms. If you develop symptoms, call your health care provider. People with severe symptoms may need hospital care. Should I be tested for this virus? Your health care provider will decide whether to test you based on your symptoms, history of exposure, and your risk factors. How does a health care provider test for this virus? Health care providers will collect samples to send for testing. Samples may include:  Taking a swab of fluid from the back of your nose and throat, your nose, or your throat.  Taking fluid from the lungs by having you cough up mucus (sputum) into a sterile cup.  Taking a blood sample. Is there a treatment or vaccine for this virus? Currently, there is no vaccine to prevent coronavirus disease. Also, there are no medicines like antibiotics or antivirals to treat the virus. A person who becomes sick is given supportive care, which means rest and fluids. A person may also relieve his or her symptoms by using over-the-counter medicines that treat sneezing, coughing, and runny nose. These are the same medicines that a person takes for the common cold. If you develop symptoms, call your health care provider. People with severe symptoms may need hospital care. What can I do to protect myself and my family from this  virus?     You can protect yourself and your family by taking the same actions that you would take to prevent the spread of other viruses. Take the following actions:  Wash your hands often with soap and water for at least 20 seconds. If soap and water are not available, use alcohol-based hand sanitizer.  Avoid touching your face, mouth, nose, or eyes.  Cough or sneeze into a tissue, sleeve, or elbow. Do not cough or sneeze into your hand or the air. ? If you cough or sneeze into a tissue, throw it away immediately and wash your hands.  Disinfect objects and surfaces that you frequently touch every day.  Stay away from people who are sick.  Avoid going out in public, follow guidance from your state and local health authorities.  Avoid crowded indoor spaces. Stay at least 6 ft (2 m) away from others.  If you must go out in public, wear a cloth face covering or face mask. Make sure your mask covers your nose and mouth.  Stay home if you are sick, except to get medical care. Call your health care provider before you get medical care. Your health care provider will tell you how long to stay home.  Make sure your vaccines are up to date. Ask your health care provider what   vaccines you need. What should I do if I need to travel? Follow travel recommendations from your local health authority, the CDC, and WHO. Travel information and advice  Centers for Disease Control and Prevention (CDC): www.cdc.gov/coronavirus/2019-ncov/travelers/index.html  World Health Organization (WHO): www.who.int/emergencies/diseases/novel-coronavirus-2019/travel-advice Know the risks and take action to protect your health  You are at higher risk of getting coronavirus disease if you are traveling to areas with an outbreak or if you are exposed to travelers from areas with an outbreak.  Wash your hands often and practice good hygiene to lower the risk of catching or spreading the virus. What should I do if I  am sick? General instructions to stop the spread of infection  Wash your hands often with soap and water for at least 20 seconds. If soap and water are not available, use alcohol-based hand sanitizer.  Cough or sneeze into a tissue, sleeve, or elbow. Do not cough or sneeze into your hand or the air.  If you cough or sneeze into a tissue, throw it away immediately and wash your hands.  Stay home unless you must get medical care. Call your health care provider or local health authority before you get medical care.  Avoid public areas. Do not take public transportation, if possible.  If you can, wear a mask if you must go out of the house or if you are in close contact with someone who is not sick. Make sure your mask covers your nose and mouth. Keep your home clean  Disinfect objects and surfaces that are frequently touched every day. This may include: ? Counters and tables. ? Doorknobs and light switches. ? Sinks and faucets. ? Electronics such as phones, remote controls, keyboards, computers, and tablets.  Wash dishes in hot, soapy water or use a dishwasher. Air-dry your dishes.  Wash laundry in hot water. Prevent infecting other household members  Let healthy household members care for children and pets, if possible. If you have to care for children or pets, wash your hands often and wear a mask.  Sleep in a different bedroom or bed, if possible.  Do not share personal items, such as razors, toothbrushes, deodorant, combs, brushes, towels, and washcloths. Where to find more information Centers for Disease Control and Prevention (CDC)  Information and news updates: www.cdc.gov/coronavirus/2019-ncov World Health Organization (WHO)  Information and news updates: www.who.int/emergencies/diseases/novel-coronavirus-2019  Coronavirus health topic: www.who.int/health-topics/coronavirus  Questions and answers on COVID-19: www.who.int/news-room/q-a-detail/q-a-coronaviruses  Global  tracker: who.sprinklr.com American Academy of Pediatrics (AAP)  Information for families: www.healthychildren.org/English/health-issues/conditions/chest-lungs/Pages/2019-Novel-Coronavirus.aspx The coronavirus situation is changing rapidly. Check your local health authority website or the CDC and WHO websites for updates and news. When should I contact a health care provider?  Contact your health care provider if you have symptoms of an infection, such as fever or cough, and you: ? Have been near anyone who is known to have coronavirus disease. ? Have come into contact with a person who is suspected to have coronavirus disease. ? Have traveled to an area where there is an outbreak of COVID-19. When should I get emergency medical care?  Get help right away by calling your local emergency services (911 in the [place] if you have: ? Trouble breathing. ? Pain or pressure in your chest. ? Confusion. ? Blue-tinged lips and fingernails. ? Difficulty waking from sleep. ? Symptoms that get worse. Let the emergency medical personnel know if you think you have coronavirus disease. Summary  A new respiratory virus is spreading from person to person and causing   COVID-19 (coronavirus disease).  The virus that causes COVID-19 appears to spread easily. It spreads from one person to another through droplets from breathing, speaking, singing, coughing, or sneezing.  Older adults and those with chronic diseases are at higher risk of disease. If you are at higher risk for complications, take extra precautions.  There is currently no vaccine to prevent coronavirus disease. There are no medicines, such as antibiotics or antivirals, to treat the virus.  You can protect yourself and your family by washing your hands often, avoiding touching your face, and covering your coughs and sneezes. This information is not intended to replace advice given to you by your health care provider. Make sure you discuss any  questions you have with your health care provider. Document Revised: 04/19/2019 Document Reviewed: 10/16/2018 Elsevier Patient Education  [place] Can Do to Manage Your COVID-19 Symptoms at Home If you have possible or confirmed COVID-19: 1. Stay home from work and school. And stay away from other public places. If you must go out, avoid using any kind of public transportation, ridesharing, or taxis. 2. Monitor your symptoms carefully. If your symptoms get worse, call your healthcare provider immediately. 3. Get rest and stay hydrated. 4. If you have a medical appointment, call the healthcare provider ahead of time and tell them that you have or may have COVID-19. 5. For medical emergencies, call 911 and notify the dispatch personnel that you have or may have COVID-19. 6. Cover your cough and sneezes with a tissue or use the inside of your elbow. 7. Wash your hands often with soap and water for at least 20 seconds or clean your hands with an alcohol-based hand sanitizer that contains at least 60% alcohol. 8. As much as possible, stay in a specific room and away from other people in your home. Also, you should use a separate bathroom, if available. If you need to be around other people in or outside of the home, wear a mask. 9. Avoid sharing personal items with other people in your household, like dishes, towels, and bedding. 10. Clean all surfaces that are touched often, like counters, tabletops, and doorknobs. Use household cleaning sprays or wipes according to the label instructions. [email or web address] 01/02/2019 This information is not intended to replace advice given to you by your health care provider. Make sure you discuss any questions you have with your health care provider. Document Revised: 06/06/2019 Document Reviewed: 06/06/2019 Elsevier Patient Education  [place].  COVID-19: How to Protect Yourself and Others Know how it spreads  There is  currently no vaccine to prevent coronavirus disease 2019 (COVID-19).  The best way to prevent illness is to avoid being exposed to this virus.  The virus is thought to spread mainly from person-to-person. ? Between people who are in close contact with one another (within about 6 feet). ? Through respiratory droplets produced when an infected person coughs, sneezes or talks. ? These droplets can land in the mouths or noses of people who are nearby or possibly be inhaled into the lungs. ? COVID-19 may be spread by people who are not showing symptoms. Everyone should Clean your hands often  Wash your hands often with soap and water for at least 20 seconds especially after you have been in a public place, or after blowing your nose, coughing, or sneezing.  If soap and water are not readily available, use a hand sanitizer that contains at least 60% alcohol. Cover all surfaces of  your hands and rub them together until they feel dry.  Avoid touching your eyes, nose, and mouth with unwashed hands. Avoid close contact  Limit contact with others as much as possible.  Avoid close contact with people who are sick.  Put distance between yourself and other people. ? Remember that some people without symptoms may be able to spread virus. ? This is especially important for people who are at higher risk of getting very GainPain.[email or web address] Cover your mouth and nose with a mask when around others  You could spread COVID-19 to others even if you do not feel sick.  Everyone should wear a mask in public settings and when around people not living in their household, especially when social distancing is difficult to maintain. ? Masks should not be placed on young children under age 54, anyone who has trouble breathing, or is unconscious, incapacitated or otherwise unable to remove the mask without assistance.  The mask is meant to protect  other people in case you are infected.  Do NOT use a facemask meant for a Dietitian.  Continue to keep about 6 feet between yourself and others. The mask is not a substitute for social distancing. Cover coughs and sneezes  Always cover your mouth and nose with a tissue when you cough or sneeze or use the inside of your elbow.  Throw used tissues in the trash.  Immediately wash your hands with soap and water for at least 20 seconds. If soap and water are not readily available, clean your hands with a hand sanitizer that contains at least 60% alcohol. Clean and disinfect  Clean AND disinfect frequently touched surfaces daily. This includes tables, doorknobs, light switches, countertops, handles, desks, phones, keyboards, toilets, faucets, and sinks. [email or web address]  If surfaces are dirty, clean them: Use detergent or soap and water prior to disinfection.  Then, use a household disinfectant. You can see a list of EPA-registered household disinfectants here. [email or web address] 03/06/2019 This information is not intended to replace advice given to you by your health care provider. Make sure you discuss any questions you have with your health care provider. Document Revised: 03/14/2019 Document Reviewed: 01/10/2019 Elsevier Patient Education  2020 [employer].   COVID-19 COVID-19 is a respiratory infection that is caused by a virus called severe acute respiratory syndrome coronavirus 2 (SARS-CoV-2). The disease is also known as coronavirus disease or novel coronavirus. In some people, the virus may not cause any symptoms. In others, it may cause a serious infection. The infection can get worse quickly and can lead to complications, such as:  Pneumonia, or infection of the lungs.  Acute respiratory distress syndrome or ARDS. This is a condition in which fluid build-up in the lungs prevents the lungs from filling  with air and passing oxygen into the blood.  Acute respiratory failure. This is a condition in which there is not enough oxygen passing from the lungs to the body or when carbon dioxide is not passing from the lungs out of the body.  Sepsis or septic shock. This is a serious bodily reaction to an infection.  Blood clotting problems.  Secondary infections due to bacteria or fungus.  Organ failure. This is when your body's organs stop working. The virus that causes COVID-19 is contagious. This means that it can spread from person to person through droplets from coughs and sneezes (respiratory secretions). What are the causes? This illness is caused by a virus. You may catch the virus by:  Breathing in droplets from  an infected person. Droplets can be spread by a person breathing, speaking, singing, coughing, or sneezing.  Touching something, like a table or a doorknob, that was exposed to the virus (contaminated) and then touching your mouth, nose, or eyes. What increases the risk? Risk for infection You are more likely to be infected with this virus if you:  Are within 6 feet (2 meters) of a person with COVID-19.  Provide care for or live with a person who is infected with COVID-19.  Spend time in crowded indoor spaces or live in shared housing. Risk for serious illness You are more likely to become seriously ill from the virus if you:  Are 29 years of age or older. The higher your age, the more you are at risk for serious illness.  Live in a nursing home or long-term care facility.  Have cancer.  Have a long-term (chronic) disease such as: ? Chronic lung disease, including chronic obstructive pulmonary disease or asthma. ? A long-term disease that lowers your body's ability to fight infection (immunocompromised). ? Heart disease, including heart failure, a condition in which the arteries that lead to the heart become narrow or blocked (coronary artery disease), a disease which  makes the heart muscle thick, weak, or stiff (cardiomyopathy). ? Diabetes. ? Chronic kidney disease. ? Sickle cell disease, a condition in which red blood cells have an abnormal "sickle" shape. ? Liver disease.  Are obese. What are the signs or symptoms? Symptoms of this condition can range from mild to severe. Symptoms may appear any time from 2 to 14 days after being exposed to the virus. They include:  A fever or chills.  A cough.  Difficulty breathing.  Headaches, body aches, or muscle aches.  Runny or stuffy (congested) nose.  A sore throat.  New loss of taste or smell. Some people may also have stomach problems, such as nausea, vomiting, or diarrhea. Other people may not have any symptoms of COVID-19. How is this diagnosed? This condition may be diagnosed based on:  Your signs and symptoms, especially if: ? You live in an area with a COVID-19 outbreak. ? You recently traveled to or from an area where the virus is common. ? You provide care for or live with a person who was diagnosed with COVID-19. ? You were exposed to a person who was diagnosed with COVID-19.  A physical exam.  Lab tests, which may include: ? Taking a sample of fluid from the back of your nose and throat (nasopharyngeal fluid), your nose, or your throat using a swab. ? A sample of mucus from your lungs (sputum). ? Blood tests.  Imaging tests, which may include, X-rays, CT scan, or ultrasound. How is this treated? At present, there is no medicine to treat COVID-19. Medicines that treat other diseases are being used on a trial basis to see if they are effective against COVID-19. Your health care provider will talk with you about ways to treat your symptoms. For most people, the infection is mild and can be managed at home with rest, fluids, and over-the-counter medicines. Treatment for a serious infection usually takes places in a hospital intensive care unit (ICU). It may include one or more of the  following treatments. These treatments are given until your symptoms improve.  Receiving fluids and medicines through an IV.  Supplemental oxygen. Extra oxygen is given through a tube in the nose, a face mask, or a hood.  Positioning you to lie on your stomach (prone position). This  makes it easier for oxygen to get into the lungs.  Continuous positive airway pressure (CPAP) or bi-level positive airway pressure (BPAP) machine. This treatment uses mild air pressure to keep the airways open. A tube that is connected to a motor delivers oxygen to the body.  Ventilator. This treatment moves air into and out of the lungs by using a tube that is placed in your windpipe.  Tracheostomy. This is a procedure to create a hole in the neck so that a breathing tube can be inserted.  Extracorporeal membrane oxygenation (ECMO). This procedure gives the lungs a chance to recover by taking over the functions of the heart and lungs. It supplies oxygen to the body and removes carbon dioxide. Follow these instructions at home: Lifestyle  If you are sick, stay home except to get medical care. Your health care provider will tell you how long to stay home. Call your health care provider before you go for medical care.  Rest at home as told by your health care provider.  Do not use any products that contain nicotine or tobacco, such as cigarettes, e-cigarettes, and chewing tobacco. If you need help quitting, ask your health care provider.  Return to your normal activities as told by your health care provider. Ask your health care provider what activities are safe for you. General instructions  Take over-the-counter and prescription medicines only as told by your health care provider.  Drink enough fluid to keep your urine pale yellow.  Keep all follow-up visits as told by your health care provider. This is important. How is this prevented?  There is no vaccine to help prevent COVID-19 infection. However,  there are steps you can take to protect yourself and others from this virus. To protect yourself:   Do not travel to areas where COVID-19 is a risk. The areas where COVID-19 is reported change often. To identify high-risk areas and travel restrictions, check the CDC travel website: FatFares.com.br  If you live in, or must travel to, an area where COVID-19 is a risk, take precautions to avoid infection. ? Stay away from people who are sick. ? Wash your hands often with soap and water for 20 seconds. If soap and water are not available, use an alcohol-based hand sanitizer. ? Avoid touching your mouth, face, eyes, or nose. ? Avoid going out in public, follow guidance from your state and local health authorities. ? If you must go out in public, wear a cloth face covering or face mask. Make sure your mask covers your nose and mouth. ? Avoid crowded indoor spaces. Stay at least 6 feet (2 meters) away from others. ? Disinfect objects and surfaces that are frequently touched every day. This may include:  Counters and tables.  Doorknobs and light switches.  Sinks and faucets.  Electronics, such as phones, remote controls, keyboards, computers, and tablets. To protect others: If you have symptoms of COVID-19, take steps to prevent the virus from spreading to others.  If you think you have a COVID-19 infection, contact your health care provider right away. Tell your health care team that you think you may have a COVID-19 infection.  Stay home. Leave your house only to seek medical care. Do not use public transport.  Do not travel while you are sick.  Wash your hands often with soap and water for 20 seconds. If soap and water are not available, use alcohol-based hand sanitizer.  Stay away from other members of your household. Let healthy household members care  for children and pets, if possible. If you have to care for children or pets, wash your hands often and wear a mask. If  possible, stay in your own room, separate from others. Use a different bathroom.  Make sure that all people in your household wash their hands well and often.  Cough or sneeze into a tissue or your sleeve or elbow. Do not cough or sneeze into your hand or into the air.  Wear a cloth face covering or face mask. Make sure your mask covers your nose and mouth. Where to find more information  Centers for Disease Control and Prevention: PurpleGadgets.be  World Health Organization: https://www.castaneda.info/ Contact a health care provider if:  You live in or have traveled to an area where COVID-19 is a risk and you have symptoms of the infection.  You have had contact with someone who has COVID-19 and you have symptoms of the infection. Get help right away if:  You have trouble breathing.  You have pain or pressure in your chest.  You have confusion.  You have bluish lips and fingernails.  You have difficulty waking from sleep.  You have symptoms that get worse. These symptoms may represent a serious problem that is an emergency. Do not wait to see if the symptoms will go away. Get medical help right away. Call your local emergency services (911 in the [place] Do not drive yourself to the hospital. Let the emergency medical personnel know if you think you have COVID-19. Summary  COVID-19 is a respiratory infection that is caused by a virus. It is also known as coronavirus disease or novel coronavirus. It can cause serious infections, such as pneumonia, acute respiratory distress syndrome, acute respiratory failure, or sepsis.  The virus that causes COVID-19 is contagious. This means that it can spread from person to person through droplets from breathing, speaking, singing, coughing, or sneezing.  You are more likely to develop a serious illness if you are 85 years of age or older, have a weak immune system, live in a nursing home, or have chronic  disease.  There is no medicine to treat COVID-19. Your health care provider will talk with you about ways to treat your symptoms.  Take steps to protect yourself and others from infection. Wash your hands often and disinfect objects and surfaces that are frequently touched every day. Stay away from people who are sick and wear a mask if you are sick. This information is not intended to replace advice given to you by your health care provider. Make sure you discuss any questions you have with your health care provider. Document Revised: 04/19/2019 Document Reviewed: 07/26/2018 Elsevier Patient Education  [employer].

## 2019-07-22 NOTE — Discharge Summary (Signed)
Physician Discharge Summary  Jessica Lewis [id number] DOB: March 14, 1946 DOA: 07/16/2019  PCP: Patient, No Pcp Per  Admit date: 07/16/2019 Discharge date: 07/22/2019  Admitted From: Home  Disposition:  Home   Recommendations for Outpatient Follow-up and new medication changes:  1. Follow up with Primary Care in 2 weeks. 2. Continue quarantine for 2 more weeks, use a mask in public and maintain physical distancing.  3. Follow up echocardiogram in 3 months. 4. Patient has been placed on metoprolol, aspirin and statin.   Home Health: no   Equipment/Devices: no    Discharge Condition: stable  CODE STATUS: full  Diet recommendation: heart healthy   Brief/Interim Summary: 74 year old female who presented with dyspnea, cough and fever. She does have significant past medical history for COPD. Patient reported progressive dyspnea, cough, fevers and malaise for about 2 weeks. On her initial physical examination her oximetry was 75% on room air, respiratory rate 30, heart rate 110, her lungs had scattered rhonchi, heart S1-S2 present, rhythmic, abdomen soft, no lower extremity edema. Sodium 134, potassium 3.6, chloride 100, bicarb 25, glucose 131, BUN 20, creatinine 0.72,troponin I(HS)109,white count 15.0, hemoglobin 12.3, hematocrit 37.0, platelets 398.Chest radiograph with bilateral upper lobes interstitial infiltrates.Initial EKG rate 112 bpm, normal axis, normal intervals, sinus rhythm, ST elevation in lead III, aVF, V5 and V6, small Q-wave lead II, III and aVF, no significant T wave abnormalities. Follow-up EKG with lower heart rate with resolution of ST elevations.  Patient was admitted to the hospital working diagnosis of acute hypoxic respiratory failure due to SARS COVID-19 viral pneumonia.  Patient with worsening troponin and apical LV hypokinesis, medical therapy for NSTEMI / MI type 2. Complicated with cardiogenic pulmonary edema. Further work up negative for PE per CT chest.    Patient has responded well to medical therapy including diuresis with furosemide, with decreased oxygen requirements.  Troponin I trended down, no chest pain or acute ischemic changes in EKG. Patient completed 48 H of full anticoagulation with heparin drip.For now will continue medical therapy with asa, B blockade and statin, no ace inh due hyperkalemia and risk of hypotension.Will need outpatient follow up echocardiogram in 3 months.   1.  Acute hypoxic respiratory failure due to SARS COVID-19 viral pneumonia.  Patient was admitted to the medical ward, she received supplemental oxygen per nasal cannula, she was treated with remdesivir, systemic corticosteroids and Tocilizumab.  She received antitussive agents, bronchodilators and airway clearing techniques with incentive spirometer and flutter valve.  Patient responded well to medical therapy with improvement of her symptoms and inflammatory markers.  Her oximetry at discharge was 91% on room air.   2.  Non-ST elevation myocardial infarction, type II MI, complicated with cardiogenic pulmonary edema, acute.  Patient had no frank chest pain, her troponins were elevated, peaked at 1,961, further work-up with echocardiography showed a preserved LV systolic function, positive apical hypokinesis preserved RV function.  Case was consulted with cardiology, recommendations to continue anticoagulation with heparin for 48 hours.  Patient received also beta-blockade, aspirin and statin with good toleration.  Her LDL was 61.  Patient will need a follow-up echocardiogram in 3 months.  3.  COPD with acute exacerbation.  Patient received bronchodilators along with systemic steroids, with improvement of her symptoms.  4.  Dyslipidemia.  Lipid profile with cholesterol 118, HDL 20, LDL 61, triglycerides 184.  Continue atorvastatin.  5.  Hyperkalemia.  Patient developed hyperkalemia, peaked at 5.5, her kidney function remained preserved, patient received  medical therapy with sodium  zirconium, along with insulin.  Follow-up potassium at discharge 4.1.   Discharge Diagnoses:  Principal Problem:   Pneumonia due to COVID-19 virus Active Problems:   Chronic obstructive pulmonary disease, unspecified ([place])   Acute respiratory failure with hypoxia ([place])   NSTEMI (non-ST elevated myocardial infarction) ([place])   Dyslipidemia    Discharge Instructions   Allergies as of 07/22/2019      Reactions   Codeine Nausea And Vomiting   Tetracyclines & Related Rash   Claritin [loratadine] Hives      Medication List    TAKE these medications   albuterol 108 (90 Base) MCG/ACT inhaler Commonly known as: VENTOLIN HFA Inhale 2 puffs into the lungs every 6 (six) hours as needed for wheezing or shortness of breath.   aspirin 81 MG EC tablet Take 1 tablet (81 mg total) by mouth daily.   atorvastatin 20 MG tablet Commonly known as: LIPITOR Take 0.5 tablets (10 mg total) by mouth daily at 6 PM.   calcium carbonate 1250 (500 Ca) MG tablet Commonly known as: OS-CAL - dosed in mg of elemental calcium Take 1,000 mg by mouth daily.   denosumab 60 MG/ML Soln injection Commonly known as: PROLIA Inject 60 mg into the skin every 6 (six) months. Administer in upper arm, thigh, or abdomen   metoprolol tartrate 25 MG tablet Commonly known as: LOPRESSOR Take 0.5 tablets (12.5 mg total) by mouth 2 (two) times daily.   ONE-A-DAY WOMENS PETITES PO Take by mouth daily.   Trelegy Ellipta 100-62.5-25 MCG/INH Aepb Generic drug: Fluticasone-Umeclidin-Vilant Inhale 1 puff into the lungs daily.   Vitamin C 500 MG Chew Chew 1 tablet by mouth daily.   Vitamin D (Ergocalciferol) 1.25 MG (50000 UNIT) Caps capsule Commonly known as: DRISDOL Take 50,000 Units by mouth every Tuesday.   zinc gluconate 50 MG tablet Take 50 mg by mouth daily.       Allergies  Allergen Reactions  . Codeine Nausea And Vomiting  . Tetracyclines & Related Rash  . Claritin  [Loratadine] Hives    Consultations:  Cardiology    Procedures/Studies: CT Angio Chest PE W and/or Wo Contrast  Result Date: 07/16/2019 CLINICAL DATA:  Shortness of breath. EXAM: CT ANGIOGRAPHY CHEST WITH CONTRAST TECHNIQUE: Multidetector CT imaging of the chest was performed using the standard protocol during bolus administration of intravenous contrast. Multiplanar CT image reconstructions and MIPs were obtained to evaluate the vascular anatomy. CONTRAST:  125mL OMNIPAQUE IOHEXOL 350 MG/ML SOLN COMPARISON:  Nov 03, 2017. FINDINGS: Cardiovascular: Satisfactory opacification of the pulmonary arteries to the segmental level. No evidence of pulmonary embolism. Normal heart size. No pericardial effusion. Atherosclerosis of thoracic aorta is noted without aneurysm or dissection. Mediastinum/Nodes: No enlarged mediastinal, hilar, or axillary lymph nodes. Thyroid gland, trachea, and esophagus demonstrate no significant findings. Lungs/Pleura: No pneumothorax or pleural effusion is noted. Multiple ground-glass opacities are noted throughout both lungs consistent with multifocal pneumonia. Upper Abdomen: No acute abnormality. Musculoskeletal: No chest wall abnormality. No acute or significant osseous findings. Review of the MIP images confirms the above findings. IMPRESSION: No definite evidence of pulmonary embolus. Multiple ground-glass opacities are noted throughout both lungs consistent with multifocal pneumonia. Aortic Atherosclerosis (ICD10-I70.0). Electronically Signed   By: Marijo Conception M.D.   On: 07/16/2019 21:20   DG Chest Portable 1 View  Result Date: 07/16/2019 CLINICAL DATA:  Fever, cough, shortness of breath EXAM: PORTABLE CHEST 1 VIEW COMPARISON:  09/12/2018 FINDINGS: Patchy bilateral airspace opacities throughout both lungs concerning for pneumonia. Heart  is borderline in size. No effusions or pneumothorax. No acute bony abnormality. IMPRESSION: Patchy bilateral airspace disease compatible  with pneumonia. Electronically Signed   By: Rolm Baptise M.D.   On: 07/16/2019 18:56   ECHOCARDIOGRAM LIMITED  Result Date: 07/17/2019   ECHOCARDIOGRAM LIMITED REPORT   Patient Name:   Jessica Lewis Date of Exam: 07/17/2019 Medical Rec #:  [id number]     Height:       64.0 in Accession #:    [id number]    Weight:       213.0 lb Date of Birth:  11/27/1945     BSA:          2.01 m Patient Age:    68 years      BP:           143/48 mmHg Patient Gender: F             HR:           83 bpm. Exam Location:  Inpatient  Procedure: Limited Echo Indications:   elevated troponin  History:       Patient has no prior history of Echocardiogram examinations.                Covid +.  Sonographer:   Jannett Celestine RDCS (AE) Referring      [phone] TIMOTHY S OPYD Phys: IMPRESSIONS  1. Left ventricular ejection fraction, by visual estimation, is 50 to 55%. The left ventricle has low normal function. There is no increased left ventricular wall thickness.  2. Left ventricular diastolic function could not be evaluated.  3. The left ventricle demonstrates regional wall motion abnormalities. There appeared to be apical hypokinesis.  4. Global right ventricle has normal systolc function.The right ventricular size is normal. no increase in right ventricular wall thickness.  5. The mitral valve is normal in structure. No evidence of mitral valve regurgitation. No evidence of mitral stenosis.  6. Mild aortic valve sclerosis without stenosis.  7. The inferior vena cava is dilated in size with >50% respiratory variability, suggesting right atrial pressure of 8 mmHg.  8. TR signal is inadequate for assessing pulmonary artery systolic pressure. FINDINGS  Left Ventricle: Left ventricular ejection fraction, by visual estimation, is 50 to 55%. The left ventricle has low normal function. The left ventricle demonstrates regional wall motion abnormalities. The left ventricular internal cavity size was the left ventricle is normal in size. There is no  increased left ventricular wall thickness. Left ventricular diastolic function could not be evaluated. Right Ventricle: The right ventricular size is normal. No increase in right ventricular wall thickness. Global RV systolic function is has normal systolic function. Pericardium: There is no evidence of pericardial effusion is seen. There is no evidence of pericardial effusion. Mitral Valve: The mitral valve is normal in structure. No evidence of mitral valve stenosis by observation. No evidence of mitral valve regurgitation. Aortic Valve: The aortic valve is tricuspid. Aortic valve regurgitation is not visualized. Mild aortic valve sclerosis is present, with no evidence of aortic valve stenosis. Pulmonic Valve: The pulmonic valve was normal in structure. Pulmonic valve regurgitation is not visualized by color flow Doppler. Pulmonic regurgitation is not visualized by color flow Doppler. Venous: The inferior vena cava is dilated in size with greater than 50% respiratory variability, suggesting right atrial pressure of 8 mmHg.  Loralie Champagne MD Electronically signed by Loralie Champagne MD Signature Date/Time: 07/17/2019/5:02:55 PMThe mitral valve is normal in structure.    Final  Procedures:   Subjective: Patient is feeling better, dyspnea continue to improve, no chest pain, no nausea or vomiting.   Discharge Exam: Vitals:   07/22/19 0400 07/22/19 0757  BP: (!) 96/37 (!) 120/48  Pulse: 76 67  Resp: 18 18  Temp: 97.9 F (36.6 C) 98 F (36.7 C)  SpO2: 94% (!) 89%   Vitals:   07/21/19 1600 07/21/19 2008 07/22/19 0400 07/22/19 0757  BP: (!) 112/33 (!) 123/58 (!) 96/37 (!) 120/48  Pulse: 77 76 76 67  Resp:  16 18 18   Temp: 97.8 F (36.6 C) 98.6 F (37 C) 97.9 F (36.6 C) 98 F (36.7 C)  TempSrc: Oral Oral Oral Oral  SpO2: 98% 92% 94% (!) 89%  Weight:      Height:        General: not in pain or dyspnea.  Neurology: Awake and alert, non focal  E ENT: no pallor, no icterus, oral mucosa  moist Cardiovascular: No JVD. S1-S2 present, rhythmic. No lower extremity edema. Pulmonary: positive breath sounds bilaterally. Gastrointestinal. Abdomen with no organomegaly, non tender, no rebound or guarding Skin. No rashes Musculoskeletal: no joint deformities   The results of significant diagnostics from this hospitalization (including imaging, microbiology, ancillary and laboratory) are listed below for reference.     Microbiology: Recent Results (from the past 240 hour(s))  Respiratory Panel by RT PCR (Flu A&B, Covid) - Nasopharyngeal Swab     Status: Abnormal   Collection Time: 07/16/19  6:42 PM   Specimen: Nasopharyngeal Swab  Result Value Ref Range Status   SARS Coronavirus 2 by RT PCR POSITIVE (A) NEGATIVE Final    Comment: RESULT CALLED TO, READ BACK BY AND VERIFIED WITH: T EASTER,RN @2123  07/16/19 MKELLY (NOTE) SARS-CoV-2 target nucleic acids are DETECTED. SARS-CoV-2 RNA is generally detectable in upper respiratory specimens  during the acute phase of infection. Positive results are indicative of the presence of the identified virus, but do not rule out bacterial infection or co-infection with other pathogens not detected by the test. Clinical correlation with patient history and other diagnostic information is necessary to determine patient infection status. The expected result is Negative. Fact Sheet for Patients:  PinkCheek.be Fact Sheet for Healthcare Providers: GravelBags.it This test is not yet approved or cleared by the [place] FDA and  has been authorized for detection and/or diagnosis of SARS-CoV-2 by FDA under an Emergency Use Authorization (EUA).  This EUA will remain in effect (meaning this test can be used) for  the duration of  the COVID-19 declaration under Section 564(b)(1) of the Act, 21 U.S.C. section 360bbb-3(b)(1), unless the authorization is terminated or revoked sooner.     Influenza A by PCR NEGATIVE NEGATIVE Final   Influenza B by PCR NEGATIVE NEGATIVE Final    Comment: (NOTE) The Xpert Xpress SARS-CoV-2/FLU/RSV assay is intended as an aid in  the diagnosis of influenza from Nasopharyngeal swab specimens and  should not be used as a sole basis for treatment. Nasal washings and  aspirates are unacceptable for Xpert Xpress SARS-CoV-2/FLU/RSV  testing. Fact Sheet for Patients: PinkCheek.be Fact Sheet for Healthcare Providers: GravelBags.it This test is not yet approved or cleared by the [place] FDA and  has been authorized for detection and/or diagnosis of SARS-CoV-2 by  FDA under an Emergency Use Authorization (EUA). This EUA will remain  in effect (meaning this test can be used) for the duration of the  Covid-19 declaration under Section 564(b)(1) of the Act, 21  U.S.C. section 360bbb-3(b)(1), unless  the authorization is  terminated or revoked. Performed at [facility], [address] [place], [place] [address]   Blood Culture (routine x 2)     Status: None   Collection Time: 07/16/19  6:54 PM   Specimen: BLOOD  Result Value Ref Range Status   Specimen Description BLOOD RIGHT ANTECUBITAL  Final   Special Requests   Final    BOTTLES DRAWN AEROBIC AND ANAEROBIC Blood Culture adequate volume   Culture   Final    NO GROWTH 6 DAYS Performed at [facility], [address], [place], [place] [address]    Report Status 07/22/2019 FINAL  Final  Blood Culture (routine x 2)     Status: None   Collection Time: 07/16/19  6:55 PM   Specimen: BLOOD RIGHT HAND  Result Value Ref Range Status   Specimen Description BLOOD RIGHT HAND  Final   Special Requests   Final    BOTTLES DRAWN AEROBIC AND ANAEROBIC Blood Culture adequate volume   Culture   Final    NO GROWTH 6 DAYS Performed at [facility], [address], [place], [place] [address]    Report Status 07/22/2019 FINAL  Final     Labs: BNP  (last 3 results) No results for input(s): BNP in the last 8760 hours. Basic Metabolic Panel: Recent Labs  Lab 07/18/19 0147 07/19/19 0155 07/20/19 0418 07/21/19 0344 07/22/19 0543  NA 138 140 136 136 137  K 5.3* 4.8 5.4* 5.3* 5.5*  CL 102 102 100 99 101  CO2 25 29 29 29 30   GLUCOSE 126* 150* 139* 148* 163*  BUN 24* 33* 34* 28* 28*  CREATININE 0.56 0.67 0.65 0.61 0.63  CALCIUM 7.9* 8.0* 7.9* 8.3* 8.2*  MG 2.1  --   --   --   --    Liver Function Tests: Recent Labs  Lab 07/18/19 0147 07/19/19 0155 07/20/19 0418 07/21/19 0344 07/22/19 0543  AST 52* 31 38 40 25  ALT 41 37 43 52* 40  ALKPHOS 109 97 94 95 78  BILITOT 0.2* 0.3 0.5 0.5 0.8  PROT 6.1* 5.8* 5.5* 6.0* 5.4*  ALBUMIN 2.0* 2.0* 2.2* 2.4* 2.3*   No results for input(s): LIPASE, AMYLASE in the last 168 hours. No results for input(s): AMMONIA in the last 168 hours. CBC: Recent Labs  Lab 07/16/19 1854 07/18/19 0147 07/19/19 0155 07/20/19 0418 07/21/19 0344  WBC 15.0* 10.5 11.5* 8.1 7.5  NEUTROABS 11.8*  --   --   --   --   HGB 12.3 11.9* 11.7* 11.7* 12.9  HCT 37.0 35.0* 34.6* 35.3* 38.6  MCV 88.1 86.6 86.9 88.3 88.7  PLT 398 390 455* 495* 512*   Cardiac Enzymes: No results for input(s): CKTOTAL, CKMB, CKMBINDEX, TROPONINI in the last 168 hours. BNP: Invalid input(s): POCBNP CBG: No results for input(s): GLUCAP in the last 168 hours. D-Dimer Recent Labs    07/20/19 0418 07/21/19 0344  DDIMER 3.66* 3.53*   Hgb A1c No results for input(s): HGBA1C in the last 72 hours. Lipid Profile No results for input(s): CHOL, HDL, LDLCALC, TRIG, CHOLHDL, LDLDIRECT in the last 72 hours. Thyroid function studies No results for input(s): TSH, T4TOTAL, T3FREE, THYROIDAB in the last 72 hours.  Invalid input(s): FREET3 Anemia work up Recent Labs    07/20/19 0418 07/21/19 0344  FERRITIN 463* 475*   Urinalysis No results found for: COLORURINE, APPEARANCEUR, LABSPEC, [place], GLUCOSEU, HGBUR, BILIRUBINUR,  KETONESUR, PROTEINUR, UROBILINOGEN, NITRITE, LEUKOCYTESUR Sepsis Labs Invalid input(s): PROCALCITONIN,  WBC,  [place] Microbiology Recent Results (from the past 240 hour(s))  Respiratory Panel by RT PCR (Flu A&B, Covid) - Nasopharyngeal Swab     Status: Abnormal   Collection Time: 07/16/19  6:42 PM   Specimen: Nasopharyngeal Swab  Result Value Ref Range Status   SARS Coronavirus 2 by RT PCR POSITIVE (A) NEGATIVE Final    Comment: RESULT CALLED TO, READ BACK BY AND VERIFIED WITH: T EASTER,RN @2123  07/16/19 MKELLY (NOTE) SARS-CoV-2 target nucleic acids are DETECTED. SARS-CoV-2 RNA is generally detectable in upper respiratory specimens  during the acute phase of infection. Positive results are indicative of the presence of the identified virus, but do not rule out bacterial infection or co-infection with other pathogens not detected by the test. Clinical correlation with patient history and other diagnostic information is necessary to determine patient infection status. The expected result is Negative. Fact Sheet for Patients:  PinkCheek.be Fact Sheet for Healthcare Providers: GravelBags.it This test is not yet approved or cleared by the [place] FDA and  has been authorized for detection and/or diagnosis of SARS-CoV-2 by FDA under an Emergency Use Authorization (EUA).  This EUA will remain in effect (meaning this test can be used) for  the duration of  the COVID-19 declaration under Section 564(b)(1) of the Act, 21 U.S.C. section 360bbb-3(b)(1), unless the authorization is terminated or revoked sooner.    Influenza A by PCR NEGATIVE NEGATIVE Final   Influenza B by PCR NEGATIVE NEGATIVE Final    Comment: (NOTE) The Xpert Xpress SARS-CoV-2/FLU/RSV assay is intended as an aid in  the diagnosis of influenza from Nasopharyngeal swab specimens and  should not be used as a sole basis for treatment. Nasal washings and   aspirates are unacceptable for Xpert Xpress SARS-CoV-2/FLU/RSV  testing. Fact Sheet for Patients: PinkCheek.be Fact Sheet for Healthcare Providers: GravelBags.it This test is not yet approved or cleared by the [place] FDA and  has been authorized for detection and/or diagnosis of SARS-CoV-2 by  FDA under an Emergency Use Authorization (EUA). This EUA will remain  in effect (meaning this test can be used) for the duration of the  Covid-19 declaration under Section 564(b)(1) of the Act, 21  U.S.C. section 360bbb-3(b)(1), unless the authorization is  terminated or revoked. Performed at [facility], [address], [place], [place] [address]   Blood Culture (routine x 2)     Status: None   Collection Time: 07/16/19  6:54 PM   Specimen: BLOOD  Result Value Ref Range Status   Specimen Description BLOOD RIGHT ANTECUBITAL  Final   Special Requests   Final    BOTTLES DRAWN AEROBIC AND ANAEROBIC Blood Culture adequate volume   Culture   Final    NO GROWTH 6 DAYS Performed at [facility], [address] [place], [place] [address]    Report Status 07/22/2019 FINAL  Final  Blood Culture (routine x 2)     Status: None   Collection Time: 07/16/19  6:55 PM   Specimen: BLOOD RIGHT HAND  Result Value Ref Range Status   Specimen Description BLOOD RIGHT HAND  Final   Special Requests   Final    BOTTLES DRAWN AEROBIC AND ANAEROBIC Blood Culture adequate volume   Culture   Final    NO GROWTH 6 DAYS Performed at [facility], [address] [place], [place] [address]    Report Status 07/22/2019 FINAL  Final     Time coordinating discharge: 45 minutes  SIGNED:   Tawni Millers, MD  [facility] 07/22/2019, 8:03 AM

## 2019-07-22 NOTE — Progress Notes (Signed)
[place] D/C at 2100. Spoke with Pt and daughter about D/C instruction answered all question. Reviewed new medications and care at home. No questions at this time gave a call back number for future questions. Vitals stable at this time Pt went home on room air with O2 states in the 90. Pt took all belongings.

## 2019-07-22 NOTE — Progress Notes (Signed)
Occupational Therapy Treatment Patient Details Name: Jessica Lewis MRN: [id number] DOB: 1945/11/27 Today's Date: 07/22/2019    History of present illness 74 year old female with COVID/Pneumonia and currently on 10 L HFNC. PMH includes COPD, Osteoporosis, obesity, rosacea, and allergic rhinitis.   OT comments  Patient very pleasant and happy to participate.  She was on 1L O2 and SpO2 resting was 94.  With ambulating 50 ft SpO2 dropped to 78 but patient was not feeling short of breath or lightheaded.  Increased O2 to 2L and recovered back to 90.  Walked 2ft more on 2L and SpO2 dropped to 82.  Recovered back to mid 90s after 5 min rest and pursed lip breathing.  Functional mobility was supervision with a rolling walker.  Completed toileting with supervision.  Reviewed incentive spirometer and completed x10.  Educated on seated theraband exercises. Will continue with OT acutely to ensure safe discharge home.   Follow Up Recommendations  No OT follow up;Supervision/Assistance - 24 hour    Equipment Recommendations  None recommended by OT    Recommendations for Other Services      Precautions / Restrictions Precautions Precautions: Fall Restrictions Weight Bearing Restrictions: No       Mobility Bed Mobility                  Transfers Overall transfer level: Needs assistance Equipment used: Rolling walker (2 wheeled) Transfers: Sit to/from Stand Sit to Stand: Supervision         General transfer comment: Supervision for safety    Balance Overall balance assessment: Mild deficits observed, not formally tested                                         ADL either performed or assessed with clinical judgement   ADL Overall ADL's : Needs assistance/impaired Eating/Feeding: Sitting;Independent   Grooming: Hospital doctor: Supervision/safety;Ambulation   Toileting- Clothing Manipulation and  Hygiene: Supervision/safety       Functional mobility during ADLs: Supervision/safety;Rolling walker       Vision       Perception     Praxis      Cognition Arousal/Alertness: Awake/alert Behavior During Therapy: WFL for tasks assessed/performed Overall Cognitive Status: Within Functional Limits for tasks assessed                                          Exercises Exercises: Other exercises Other Exercises Other Exercises: x10 incentive spirometer   Shoulder Instructions       General Comments      Pertinent Vitals/ Pain       Pain Assessment: No/denies pain  Home Living                                          Prior Functioning/Environment              Frequency  Min 2X/week        Progress Toward Goals  OT Goals(current goals can now be found in the care plan section)  Progress towards OT goals: Progressing toward goals  Acute Rehab OT Goals Patient Stated Goal: "I hope I can get better and go home with my husband." OT Goal Formulation: With patient Time For Goal Achievement: 08/02/19 Potential to Achieve Goals: Good  Plan Discharge plan remains appropriate    Co-evaluation                 AM-PAC OT "6 Clicks" Daily Activity     Outcome Measure   Help from another person eating meals?: None Help from another person taking care of personal grooming?: A Little Help from another person toileting, which includes using toliet, bedpan, or urinal?: A Little Help from another person bathing (including washing, rinsing, drying)?: A Little Help from another person to put on and taking off regular upper body clothing?: A Little Help from another person to put on and taking off regular lower body clothing?: A Little 6 Click Score: 19    End of Session Equipment Utilized During Treatment: Oxygen  OT Visit Diagnosis: Unsteadiness on feet (R26.81);Other abnormalities of gait and mobility (R26.89);Muscle  weakness (generalized) (M62.81)   Activity Tolerance Patient limited by fatigue   Patient Left in chair;with call bell/phone within reach   Nurse Communication Mobility status        Time: [id number] OT Time Calculation (min): 38 min  Charges: OT General Charges $OT Visit: 1 Visit OT Treatments $Self Care/Home Management : 38-52 mins  August Luz, OTR/L    Phylliss Bob 07/22/2019, 11:35 AM

## 2019-07-30 ENCOUNTER — Other Ambulatory Visit: Payer: Self-pay

## 2019-07-30 ENCOUNTER — Ambulatory Visit (INDEPENDENT_AMBULATORY_CARE_PROVIDER_SITE_OTHER): Payer: Medicare Other | Admitting: Family Medicine

## 2019-07-30 ENCOUNTER — Encounter: Payer: Self-pay | Admitting: Family Medicine

## 2019-07-30 VITALS — BP 170/78 | HR 82 | Temp 98.6°F | Ht 64.0 in | Wt 209.0 lb

## 2019-07-30 DIAGNOSIS — D72829 Elevated white blood cell count, unspecified: Secondary | ICD-10-CM

## 2019-07-30 DIAGNOSIS — J1282 Pneumonia due to coronavirus disease 2019: Secondary | ICD-10-CM

## 2019-07-30 DIAGNOSIS — E875 Hyperkalemia: Secondary | ICD-10-CM

## 2019-07-30 DIAGNOSIS — M8580 Other specified disorders of bone density and structure, unspecified site: Secondary | ICD-10-CM

## 2019-07-30 DIAGNOSIS — R931 Abnormal findings on diagnostic imaging of heart and coronary circulation: Secondary | ICD-10-CM

## 2019-07-30 DIAGNOSIS — U071 COVID-19: Secondary | ICD-10-CM | POA: Diagnosis not present

## 2019-07-30 DIAGNOSIS — J449 Chronic obstructive pulmonary disease, unspecified: Secondary | ICD-10-CM

## 2019-07-30 DIAGNOSIS — E785 Hyperlipidemia, unspecified: Secondary | ICD-10-CM

## 2019-07-30 MED ORDER — METOPROLOL TARTRATE 25 MG PO TABS: 12.5000 mg | ORAL_TABLET | Freq: Two times a day (BID) | ORAL | 1 refills | Status: DC

## 2019-07-30 MED ORDER — ATORVASTATIN CALCIUM 20 MG PO TABS: 10.0000 mg | ORAL_TABLET | Freq: Every day | ORAL | 3 refills | Status: DC

## 2019-07-30 NOTE — Patient Instructions (Signed)
Chest xray next week  Cbc, cmp

## 2019-07-30 NOTE — Progress Notes (Signed)
New Patient Office Visit  Subjective:  Patient ID: Jessica Lewis, female    DOB: 03-Sep-1945  Age: 74 y.o. MRN: [id number]  CC:  Chief Complaint  Patient presents with  . Establish Care  . Foot Swelling    HPI Jessica Lewis presents for COPD-Trielegy/ventolin HFA-trigger  With infections/AR Recommendations for Outpatient Follow-up and new medication changes:  1. Follow up with Primary Care in 2 weeks. 2. Continue quarantine for 2 more weeks, use a mask in public and maintain physical distancing.  3. Follow up echocardiogram in 3 months. 4. Patient has been placed on metoprolol, aspirin and statin.  Brief/Interim Summary: reviewed 74 year old female who presented with dyspnea, cough and fever. She does have significant past medical history for COPD. Patient reported progressive dyspnea, cough, fevers and malaise for about 2 weeks. On her initial physical examination her oximetry was 75% on room air, respiratory rate 30, heart rate 110, her lungs had scattered rhonchi, heart S1-S2 present, rhythmic, abdomen soft, no lower extremity edema.Sodium 134, potassium 3.6, chloride 100, bicarb 25, glucose 131, BUN 20, creatinine 0.72,troponin I(HS)109,white count 15.0, hemoglobin 12.3, hematocrit 37.0, platelets 398.Chest radiograph with bilateral upper lobes interstitial infiltrates.Initial EKG rate 112 bpm, normal axis, normal intervals, sinus rhythm, ST elevation in lead III, aVF, V5 and V6, small Q-wave lead II, III and aVF, no significant T wave abnormalities. Follow-up EKG with lower heart rate with resolution of ST elevations.Patient was admitted to the hospital working diagnosis of acute hypoxic respiratory failure due to SARS COVID-19 viral pneumonia.Patient with worsening troponin and apical LV hypokinesis, medical therapy for NSTEMI / MI type 2. Complicated with cardiogenic pulmonary edema. Further work up negative for PE per CT chest. Patient has responded well to medical therapy  including diuresis with furosemide, with decreased oxygen requirements.Troponin I trended down, no chest pain or acute ischemic changes in EKG. Patient completed 48 H of full anticoagulation with heparin drip.For now will continue medical therapy with asa, B blockade and statin, no ace inh due hyperkalemia and risk of hypotension.Will need outpatient follow up echocardiogram in 3 months.  Past Medical History:  Diagnosis Date  . Acute bronchitis   . Age-related osteoporosis without current pathological fracture   . Allergic rhinitis due to pollen   . Bronchitis   . Chronic obstructive pulmonary disease, unspecified ([place])   . Cystocele 10/14/2015  . Headache(784.0)    menstral migraines  . Influenza due to unidentified influenza virus with other respiratory manifestations   . Obesity   . Obesity, unspecified   . Osteoporosis   . Osteoporosis, unspecified   . Other chest pain   . Rectocele 11/16/2012  . Rosacea   . Shortness of breath     Past Surgical History:  Procedure Laterality Date  . CHOLECYSTECTOMY    . COLONOSCOPY  02/09/2011   Procedure: COLONOSCOPY;  Surgeon: Rogene Houston, MD;  [facility];  Service: Endoscopy;  Laterality: N/A;  . TUBAL LIGATION Bilateral     Family History  Problem Relation Age of Onset  . Stroke Mother     Social History   Socioeconomic History  . Marital status: Married    Spouse name: Not on file  . Number of children: Not on file  . Years of education: Not on file  . Highest education level: Not on file  Occupational History  . Not on file  Tobacco Use  . Smoking status: Former Smoker    Types: Cigarettes  . Smokeless tobacco: Never  Used  Substance and Sexual Activity  . Alcohol use: No  . Drug use: No  . Sexual activity: Not Currently    Birth control/protection: None  Other Topics Concern  . Not on file  Social History Narrative  . Not on file   Social Determinants of Health   Financial Resource Strain:   .  Difficulty of Paying Living Expenses: Not on file  Food Insecurity:   . Worried About Charity fundraiser in the Last Year: Not on file  . Ran Out of Food in the Last Year: Not on file  Transportation Needs:   . Lack of Transportation (Medical): Not on file  . Lack of Transportation (Non-Medical): Not on file  Physical Activity:   . Days of Exercise per Week: Not on file  . Minutes of Exercise per Session: Not on file  Stress:   . Feeling of Stress : Not on file  Social Connections:   . Frequency of Communication with Friends and Family: Not on file  . Frequency of Social Gatherings with Friends and Family: Not on file  . Attends Religious Services: Not on file  . Active Member of Clubs or Organizations: Not on file  . Attends Archivist Meetings: Not on file  . Marital Status: Not on file  Intimate Partner Violence:   . Fear of Current or Ex-Partner: Not on file  . Emotionally Abused: Not on file  . Physically Abused: Not on file  . Sexually Abused: Not on file    ROS Review of Systems  Constitutional: Negative.   Eyes:       Cataracts  Respiratory:       COPD  Cardiovascular: Positive for leg swelling.       MI  Musculoskeletal:       Osteoporosis  Hematological: Negative.   Psychiatric/Behavioral: Negative.     Objective:   Today's Vitals: BP (!) 170/78 (BP Location: Left Arm, Patient Position: Sitting, Cuff Size: Normal)   Pulse 82   Temp 98.6 F (37 C)   Ht 5\' 4"  (1.626 m)   Wt 209 lb (94.8 kg)   SpO2 90%   BMI 35.87 kg/m   Physical Exam Constitutional:      Appearance: Normal appearance.  HENT:     Head: Normocephalic and atraumatic.  Cardiovascular:     Rate and Rhythm: Normal rate and regular rhythm.     Pulses: Normal pulses.     Heart sounds: Normal heart sounds.  Pulmonary:     Effort: Pulmonary effort is normal.     Breath sounds: Normal breath sounds.  Musculoskeletal:     Cervical back: Normal range of motion and neck supple.   Neurological:     Mental Status: She is alert and oriented to person, place, and time.  Psychiatric:        Mood and Affect: Mood normal.     Assessment & Plan:  1. Hyperkalemia Follow up from hospitalization-likely related to COVID - Comprehensive Metabolic Panel (CMET)  2. Leukocytosis, unspecified type Repeat cbc-likely COVID related - CBC w/Diff/Platelet  3. Pneumonia due to COVID-19 virus Follow up with cxr recommended-cardio to evaluate - DG Chest 2 View; Future - Ambulatory referral to Cardiology  4. Echocardiogram abnormal 07/17/19 completed-no f/u scheduled - Ambulatory referral to Cardiology  5. Chronic obstructive pulmonary disease, unspecified COPD type ([place]) Trelegy/albuterol prn-stable-h/o of severe obstruction with reversibility with albuterol 6. Dyslipidemia Atorvastatin-lipid panel, lft  7. Osteopenia, unspecified location DEXA-Prolia/Ca/Vit D  Outpatient  Encounter Medications as of 07/30/2019  Medication Sig  . albuterol (VENTOLIN HFA) 108 (90 Base) MCG/ACT inhaler Inhale 2 puffs into the lungs every 6 (six) hours as needed for wheezing or shortness of breath.  . Ascorbic Acid (VITAMIN C) 500 MG CHEW Chew 1 tablet by mouth daily.  Marland Kitchen aspirin EC 81 MG EC tablet Take 1 tablet (81 mg total) by mouth daily.  Marland Kitchen atorvastatin (LIPITOR) 20 MG tablet Take 0.5 tablets (10 mg total) by mouth daily at 6 PM.  . calcium carbonate (OS-CAL - DOSED IN MG OF ELEMENTAL CALCIUM) 1250 (500 Ca) MG tablet Take 1,000 mg by mouth daily.   Marland Kitchen denosumab (PROLIA) 60 MG/ML SOLN injection Inject 60 mg into the skin every 6 (six) months. Administer in upper arm, thigh, or abdomen  . Fluticasone-Umeclidin-Vilant (TRELEGY ELLIPTA) 100-62.5-25 MCG/INH AEPB Inhale 1 puff into the lungs daily.  . metoprolol tartrate (LOPRESSOR) 25 MG tablet Take 0.5 tablets (12.5 mg total) by mouth 2 (two) times daily.  . Multiple Vitamins-Minerals (ONE-A-DAY WOMENS PETITES PO) Take by mouth daily.  . Vitamin  D, Ergocalciferol, (DRISDOL) 1.25 MG (50000 UNIT) CAPS capsule Take 50,000 Units by mouth every Tuesday.   . zinc gluconate 50 MG tablet Take 50 mg by mouth daily.   No facility-administered encounter medications on file as of 07/30/2019.    Follow-up: 3 months-cardiology referral-concern for vascular component of COVID diagnosis with abnormal echo-no prior cardiology concerns Jessica Wix Hannah Beat, MD

## 2019-08-03 DIAGNOSIS — E875 Hyperkalemia: Secondary | ICD-10-CM | POA: Insufficient documentation

## 2019-08-03 DIAGNOSIS — D72829 Elevated white blood cell count, unspecified: Secondary | ICD-10-CM | POA: Insufficient documentation

## 2019-08-03 DIAGNOSIS — R931 Abnormal findings on diagnostic imaging of heart and coronary circulation: Secondary | ICD-10-CM | POA: Insufficient documentation

## 2019-08-05 ENCOUNTER — Telehealth: Payer: Self-pay | Admitting: Cardiology

## 2019-08-05 NOTE — Telephone Encounter (Signed)

## 2019-08-07 ENCOUNTER — Ambulatory Visit: Payer: Medicare Other | Admitting: Cardiology

## 2019-08-07 ENCOUNTER — Other Ambulatory Visit: Payer: Self-pay

## 2019-08-07 ENCOUNTER — Encounter: Payer: Self-pay | Admitting: Cardiology

## 2019-08-07 ENCOUNTER — Telehealth: Payer: Self-pay | Admitting: Cardiology

## 2019-08-07 VITALS — BP 164/74 | HR 98 | Ht 62.0 in | Wt 212.0 lb

## 2019-08-07 DIAGNOSIS — I700 Atherosclerosis of aorta: Secondary | ICD-10-CM

## 2019-08-07 DIAGNOSIS — U071 COVID-19: Secondary | ICD-10-CM

## 2019-08-07 DIAGNOSIS — J1282 Pneumonia due to coronavirus disease 2019: Secondary | ICD-10-CM | POA: Diagnosis not present

## 2019-08-07 DIAGNOSIS — I429 Cardiomyopathy, unspecified: Secondary | ICD-10-CM | POA: Diagnosis not present

## 2019-08-07 MED ORDER — LOSARTAN POTASSIUM 25 MG PO TABS: 12.5000 mg | ORAL_TABLET | Freq: Every day | ORAL | 3 refills | Status: DC

## 2019-08-07 NOTE — Patient Instructions (Addendum)
Medication Instructions:   Your physician has recommended you make the following change in your medication:   Start losartan 12.5 mg by mouth daily  Continue other medications the same  Labwork:  Your physician recommends that you return for lab work in: 10 days to check your BMET. This may be done at [facility] or [facility] in [place].   Testing/Procedures: Your physician has requested that you have an echocardiogram. Echocardiography is a painless test that uses sound waves to create images of your heart. It provides your doctor with information about the size and shape of your heart and how well your heart's chambers and valves are working. This procedure takes approximately one hour. There are no restrictions for this procedure.  Follow-Up:  Your physician recommends that you schedule a follow-up appointment in: 6-8 weeks (virtual or office).   Any Other Special Instructions Will Be Listed Below (If Applicable).  If you need a refill on your cardiac medications before your next appointment, please call your pharmacy.

## 2019-08-07 NOTE — Telephone Encounter (Signed)
  Precert needed for: Echo   Location: [facility]    Date: September 19, 2019

## 2019-08-07 NOTE — Progress Notes (Signed)
Cardiology Office Note  Date: 08/07/2019   ID: Natale, Melgarejo [birth year], MRN [id number]  PCP:  Maryruth Hancock, MD  Cardiologist:  Rozann Lesches, MD Electrophysiologist:  None   Chief Complaint  Patient presents with  . Abnormal echocardiogram    History of Present Illness: Jessica Lewis is a 74 y.o. female referred for cardiology consultation by Dr. Holly Bodily due to abnormal echocardiogram.  I reviewed extensive records.  She was actually seen recently by Dr. Harl Bowie in consultation in January in the setting of abnormal high-sensitivity troponin I levels during diagnosis of COVID-19 pneumonia with hypoxic respiratory failure.  She was managed at the [facility]. She was treated with remdesivir, systemic corticosteroids and Tocilizumab.  Peak high-sensitivity troponin I level was 1961, she was on heparin for 48 hours after discussion with cardiology, and echocardiogram revealed LVEF 50 to 55% with apical hypokinesis, normal RV contraction.  ECG did show some dynamic inferolateral ST changes.  She was discharged on aspirin, Lipitor, and Lopressor.  She presents today with her daughter.  We discussed her hospital course and cardiac testing.  She states that she is gradually increasing her activities at home and is feeling better in general.  She does not describe any chest pain, no recent cough.  I reviewed her home blood pressure checks.  She reports tolerating current regimen which is outlined below.  Baseline cardiac risk factor profile is not robust.  She did have incidentally noted atherosclerotic calcifications of the aorta by CT imaging.  Past Medical History:  Diagnosis Date  . Allergic rhinitis due to pollen   . Bronchitis   . Chronic obstructive pulmonary disease, unspecified ([place])   . Cystocele 10/14/2015  . Headache(784.0)    Menstral migraines  . Osteoporosis   . Pneumonia due to COVID-19 virus   . Rectocele 11/16/2012  . Rosacea     Past Surgical History:    Procedure Laterality Date  . CHOLECYSTECTOMY    . COLONOSCOPY  02/09/2011   Procedure: COLONOSCOPY;  Surgeon: Rogene Houston, MD;  [facility];  Service: Endoscopy;  Laterality: N/A;  . TUBAL LIGATION Bilateral     Current Outpatient Medications  Medication Sig Dispense Refill  . albuterol (VENTOLIN HFA) 108 (90 Base) MCG/ACT inhaler Inhale 2 puffs into the lungs every 6 (six) hours as needed for wheezing or shortness of breath.    . Ascorbic Acid (VITAMIN C) 500 MG CHEW Chew 1 tablet by mouth daily.    Marland Kitchen aspirin EC 81 MG EC tablet Take 1 tablet (81 mg total) by mouth daily. 30 tablet 0  . atorvastatin (LIPITOR) 20 MG tablet Take 0.5 tablets (10 mg total) by mouth daily at 6 PM. 45 tablet 3  . calcium carbonate (OS-CAL - DOSED IN MG OF ELEMENTAL CALCIUM) 1250 (500 Ca) MG tablet Take 1,000 mg by mouth daily.     . Fluticasone-Umeclidin-Vilant (TRELEGY ELLIPTA) 100-62.5-25 MCG/INH AEPB Inhale 1 puff into the lungs daily.    . metoprolol tartrate (LOPRESSOR) 25 MG tablet Take 0.5 tablets (12.5 mg total) by mouth 2 (two) times daily. 90 tablet 1  . Multiple Vitamins-Minerals (ONE-A-DAY WOMENS PETITES PO) Take by mouth daily.    . Vitamin D, Ergocalciferol, (DRISDOL) 1.25 MG (50000 UNIT) CAPS capsule Take 50,000 Units by mouth every Tuesday.     . zinc gluconate 50 MG tablet Take 50 mg by mouth daily.    Marland Kitchen losartan (COZAAR) 25 MG tablet Take 0.5 tablets (12.5 mg  total) by mouth daily. 45 tablet 3   No current facility-administered medications for this visit.   Allergies:  Codeine, Tetracyclines & related, and Claritin [loratadine]   Social History: The patient  reports that she has quit smoking. Her smoking use included cigarettes. She has never used smokeless tobacco. She reports that she does not drink alcohol or use drugs.   Family History: The patient's family history includes Stroke in her mother.   ROS:  Please see the history of present illness. Otherwise, complete review  of systems is positive for none.  All other systems are reviewed and negative.   Physical Exam: VS:  BP (!) 164/74   Pulse 98   Ht 5\' 2"  (1.575 m)   Wt 212 lb (96.2 kg)   SpO2 98%   BMI 38.78 kg/m , BMI Body mass index is 38.78 kg/m.  Wt Readings from Last 3 Encounters:  08/07/19 212 lb (96.2 kg)  07/30/19 209 lb (94.8 kg)  07/16/19 213 lb (96.6 kg)    General: Patient appears comfortable at rest. HEENT: Conjunctiva and lids normal, wearing a mask. Neck: Supple, no elevated JVP or carotid bruits, no thyromegaly. Lungs: Clear to auscultation, nonlabored breathing at rest. Cardiac: Regular rate and rhythm, no S3, soft systolic murmur, no pericardial rub. Abdomen: Soft, nontender, bowel sounds present. Extremities: No pitting edema, distal pulses 2+. Skin: Warm and dry. Musculoskeletal: No kyphosis. Neuropsychiatric: Alert and oriented x3, affect grossly appropriate.  ECG:  An ECG dated 07/18/2019 was personally reviewed today and demonstrated:  Sinus rhythm with PAC, nonspecific T wave changes, decreased R wave progression without definitive infarct pattern.  Recent Labwork: 07/18/2019: Magnesium 2.1 07/21/2019: Hemoglobin 12.9; Platelets 512 07/22/2019: ALT 40; AST 25; BUN 26; Creatinine, Ser 0.70; Potassium 4.1; Sodium 136     Component Value Date/Time   CHOL 118 07/22/2019 1415   TRIG 184 (H) 07/22/2019 1415   HDL 20 (L) 07/22/2019 1415   CHOLHDL 5.9 07/22/2019 1415   VLDL 37 07/22/2019 1415   LDLCALC 61 07/22/2019 1415    Other Studies Reviewed Today:  Echocardiogram 07/17/2019: 1. Left ventricular ejection fraction, by visual estimation, is 50 to  55%. The left ventricle has low normal function. There is no increased  left ventricular wall thickness.  2. Left ventricular diastolic function could not be evaluated.  3. The left ventricle demonstrates regional wall motion abnormalities.  There appeared to be apical hypokinesis.  4. Global right ventricle has normal  systolc function.The right  ventricular size is normal. no increase in right ventricular wall  thickness.  5. The mitral valve is normal in structure. No evidence of mitral valve  regurgitation. No evidence of mitral stenosis.  6. Mild aortic valve sclerosis without stenosis.  7. The inferior vena cava is dilated in size with >50% respiratory  variability, suggesting right atrial pressure of 8 mmHg.  8. TR signal is inadequate for assessing pulmonary artery systolic  pressure.   Chest CTA 07/16/2019: FINDINGS: Cardiovascular: Satisfactory opacification of the pulmonary arteries to the segmental level. No evidence of pulmonary embolism. Normal heart size. No pericardial effusion. Atherosclerosis of thoracic aorta is noted without aneurysm or dissection.  Mediastinum/Nodes: No enlarged mediastinal, hilar, or axillary lymph nodes. Thyroid gland, trachea, and esophagus demonstrate no significant findings.  Lungs/Pleura: No pneumothorax or pleural effusion is noted. Multiple ground-glass opacities are noted throughout both lungs consistent with multifocal pneumonia.  Upper Abdomen: No acute abnormality.  Musculoskeletal: No chest wall abnormality. No acute or significant osseous findings.  Review of the MIP images confirms the above findings.  IMPRESSION: No definite evidence of pulmonary embolus.  Multiple ground-glass opacities are noted throughout both lungs consistent with multifocal pneumonia.  Aortic Atherosclerosis (ICD10-I70.0).  Assessment and Plan:  1.  Suspected COVID-19 myocarditis with peak high-sensitivity troponin I of 1961, echocardiogram showing LVEF low normal range of 50 to 55% with apical hypokinesis and normal RV contraction.  Could potentially have also represented demand ischemia (type II NSTEMI), but at this point doubt acute coronary event.  For now would continue aspirin and statin, Lopressor, also adding Cozaar 12.5 mg daily.  She will  continue to track blood pressure.  We will obtain a follow-up echocardiogram in the next 6 to 8 weeks.  2.  Recent treatment for COVID-19 pneumonia with hypoxic respiratory failure.  Hospital course reviewed.  3.  Incidentally noted aortic atherosclerosis by CT imaging.  She is on aspirin and statin therapy.  Medication Adjustments/Labs and Tests Ordered: Current medicines are reviewed at length with the patient today.  Concerns regarding medicines are outlined above.   Tests Ordered: Orders Placed This Encounter  Procedures  . Basic metabolic panel  . ECHOCARDIOGRAM COMPLETE    Medication Changes: Meds ordered this encounter  Medications  . losartan (COZAAR) 25 MG tablet    Sig: Take 0.5 tablets (12.5 mg total) by mouth daily.    Dispense:  45 tablet    Refill:  3    08/07/2019 NEW    Disposition:  Follow up after echocardiogram.  Signed, Satira Sark, MD, [facility] 08/07/2019 1:33 PM    [facility] [place], [place], [place] [address] Phone: [phone]; Fax: [phone]

## 2019-08-16 ENCOUNTER — Other Ambulatory Visit: Payer: Self-pay

## 2019-08-16 ENCOUNTER — Ambulatory Visit (HOSPITAL_COMMUNITY)
Admission: RE | Admit: 2019-08-16 | Discharge: 2019-08-16 | Disposition: A | Discharge status: Home / Self Care | Payer: Medicare Other | Source: Ambulatory Visit | Attending: Family Medicine | Admitting: Family Medicine

## 2019-08-16 DIAGNOSIS — U071 COVID-19: Secondary | ICD-10-CM | POA: Diagnosis present

## 2019-08-16 DIAGNOSIS — R0602 Shortness of breath: Secondary | ICD-10-CM | POA: Diagnosis not present

## 2019-08-16 DIAGNOSIS — E875 Hyperkalemia: Secondary | ICD-10-CM | POA: Diagnosis not present

## 2019-08-16 DIAGNOSIS — D72829 Elevated white blood cell count, unspecified: Secondary | ICD-10-CM | POA: Diagnosis not present

## 2019-08-16 DIAGNOSIS — J1282 Pneumonia due to coronavirus disease 2019: Secondary | ICD-10-CM | POA: Insufficient documentation

## 2019-08-17 ENCOUNTER — Other Ambulatory Visit: Payer: Self-pay | Admitting: Family Medicine

## 2019-08-17 DIAGNOSIS — U071 COVID-19: Secondary | ICD-10-CM

## 2019-08-17 DIAGNOSIS — J1282 Pneumonia due to coronavirus disease 2019: Secondary | ICD-10-CM

## 2019-08-17 LAB — COMPREHENSIVE METABOLIC PANEL
ALT: 43 IU/L — ABNORMAL HIGH (ref 0–32)
AST: 33 IU/L (ref 0–40)
Albumin/Globulin Ratio: 1.6 (ref 1.2–2.2)
Albumin: 3.9 g/dL (ref 3.7–4.7)
Alkaline Phosphatase: 89 IU/L (ref 39–117)
BUN/Creatinine Ratio: 25 (ref 12–28)
BUN: 18 mg/dL (ref 8–27)
Bilirubin Total: 0.4 mg/dL (ref 0.0–1.2)
CO2: 26 mmol/L (ref 20–29)
Calcium: 9.2 mg/dL (ref 8.7–10.3)
Chloride: 104 mmol/L (ref 96–106)
Creatinine, Ser: 0.72 mg/dL (ref 0.57–1.00)
GFR calc Af Amer: 97 mL/min/{1.73_m2} (ref >59)
GFR calc non Af Amer: 84 mL/min/{1.73_m2} (ref >59)
Globulin, Total: 2.4 g/dL (ref 1.5–4.5)
Glucose: 108 mg/dL — ABNORMAL HIGH (ref 65–99)
Potassium: 4.7 mmol/L (ref 3.5–5.2)
Sodium: 142 mmol/L (ref 134–144)
Total Protein: 6.3 g/dL (ref 6.0–8.5)

## 2019-08-17 LAB — CBC WITH DIFFERENTIAL/PLATELET
Basophils Absolute: 0.1 10*3/uL (ref 0.0–0.2)
Basos: 1 %
EOS (ABSOLUTE): 0.2 10*3/uL (ref 0.0–0.4)
Eos: 3 %
Hematocrit: 47.3 % — ABNORMAL HIGH (ref 34.0–46.6)
Hemoglobin: 15.1 g/dL (ref 11.1–15.9)
Immature Grans (Abs): 0 10*3/uL (ref 0.0–0.1)
Immature Granulocytes: 0 %
Lymphocytes Absolute: 3.3 10*3/uL — ABNORMAL HIGH (ref 0.7–3.1)
Lymphs: 40 %
MCH: 29.5 pg (ref 26.6–33.0)
MCHC: 31.9 g/dL (ref 31.5–35.7)
MCV: 92 fL (ref 79–97)
Monocytes Absolute: 1.1 10*3/uL — ABNORMAL HIGH (ref 0.1–0.9)
Monocytes: 14 %
Neutrophils Absolute: 3.5 10*3/uL (ref 1.4–7.0)
Neutrophils: 42 %
Platelets: 293 10*3/uL (ref 150–450)
RBC: 5.12 x10E6/uL (ref 3.77–5.28)
RDW: 16.6 % — ABNORMAL HIGH (ref 11.7–15.4)
WBC: 8.2 10*3/uL (ref 3.4–10.8)

## 2019-08-19 ENCOUNTER — Ambulatory Visit (HOSPITAL_COMMUNITY)
Admission: RE | Admit: 2019-08-19 | Discharge: 2019-08-19 | Disposition: A | Discharge status: Home / Self Care | Payer: Medicare Other | Source: Ambulatory Visit | Attending: Family Medicine | Admitting: Family Medicine

## 2019-08-19 ENCOUNTER — Other Ambulatory Visit: Payer: Self-pay

## 2019-08-19 ENCOUNTER — Telehealth: Payer: Self-pay | Admitting: Family Medicine

## 2019-08-19 DIAGNOSIS — J1282 Pneumonia due to coronavirus disease 2019: Secondary | ICD-10-CM | POA: Diagnosis present

## 2019-08-19 DIAGNOSIS — I700 Atherosclerosis of aorta: Secondary | ICD-10-CM | POA: Insufficient documentation

## 2019-08-19 DIAGNOSIS — J8410 Pulmonary fibrosis, unspecified: Secondary | ICD-10-CM | POA: Diagnosis not present

## 2019-08-19 DIAGNOSIS — U071 COVID-19: Secondary | ICD-10-CM | POA: Insufficient documentation

## 2019-08-19 NOTE — Telephone Encounter (Signed)
Stacy at [facility] radiology called and states she has a chest xray report, she can be reached at [phone]

## 2019-08-19 NOTE — Addendum Note (Signed)
Addended by: Eual Fines on: 08/19/2019 02:01 PM   Modules accepted: Orders

## 2019-08-20 NOTE — Telephone Encounter (Signed)
Spoke with Dr Holly Bodily and Ct has been scheduled along with Referral to Pulmonology.

## 2019-08-22 ENCOUNTER — Institutional Professional Consult (permissible substitution): Payer: Medicare Other | Admitting: Internal Medicine

## 2019-08-30 ENCOUNTER — Ambulatory Visit (HOSPITAL_COMMUNITY): Payer: Medicare Other

## 2019-09-06 ENCOUNTER — Ambulatory Visit: Payer: Medicare Other | Admitting: Internal Medicine

## 2019-09-06 ENCOUNTER — Other Ambulatory Visit: Payer: Self-pay

## 2019-09-06 ENCOUNTER — Ambulatory Visit (INDEPENDENT_AMBULATORY_CARE_PROVIDER_SITE_OTHER): Payer: Medicare Other

## 2019-09-06 ENCOUNTER — Encounter: Payer: Self-pay | Admitting: Internal Medicine

## 2019-09-06 DIAGNOSIS — J449 Chronic obstructive pulmonary disease, unspecified: Secondary | ICD-10-CM

## 2019-09-06 DIAGNOSIS — U071 COVID-19: Secondary | ICD-10-CM | POA: Diagnosis not present

## 2019-09-06 DIAGNOSIS — J9601 Acute respiratory failure with hypoxia: Secondary | ICD-10-CM

## 2019-09-06 DIAGNOSIS — J1282 Pneumonia due to coronavirus disease 2019: Secondary | ICD-10-CM

## 2019-09-06 NOTE — Progress Notes (Signed)
Jessica Lewis, female    DOB: May 20, 1946     MRN: [id number]   Brief patient profile:  32 yowf quit smoking 1987 with freq bronchitis which seemed better p quit then more freq in 2018 with pfts 12/26/17 c/w COPD GOLD  III  And started on trelegy which seemed to help  doe/ bronchitis flares  then Dec 31st 2020 acute sob/ fever dx covid 19>>>    Admit date: 07/16/2019 Discharge date: 07/22/2019   Brief/Interim Summary: 74 year old female who presented with dyspnea, cough and fever x about 2 weeks. On her initial physical examination her oximetry was 75% on room air, respiratory rate 30, heart rate 110, her lungs had scattered rhonchi, heart S1-S2 present, rhythmic, abdomen soft, no lower extremity edema. Sodium 134, potassium 3.6, chloride 100, bicarb 25, glucose 131, BUN 20, creatinine 0.72,troponin I(HS)109,white count 15.0, hemoglobin 12.3, hematocrit 37.0, platelets 398.Chest radiograph with bilateral upper lobes interstitial infiltrates.Initial EKG rate 112 bpm, normal axis, normal intervals, sinus rhythm, ST elevation in lead III, aVF, V5 and V6, small Q-wave lead II, III and aVF, no significant T wave abnormalities. Follow-up EKG with lower heart rate with resolution of ST elevations.  Patient was admitted to the hospital working diagnosis of acute hypoxic respiratory failure due to SARS COVID-19 viral pneumonia.  Patient with worsening troponin and apical LV hypokinesis, medical therapy for NSTEMI / MI type 2. Complicated with cardiogenic pulmonary edema. Further work up negative for PE per CT chest.   Patient has responded well to medical therapy including diuresis with furosemide, with decreased oxygen requirements.  Troponin I trended down, no chest pain or acute ischemic changes in EKG. Patient completed 48 H of full anticoagulation with heparin drip. rec continue medical therapy with asa, B blockade and statin, no ace inh due hyperkalemia and risk of hypotension.Will  need outpatient follow up echocardiogram in 3 months.   1.  Acute hypoxic respiratory failure due to SARS COVID-19 viral pneumonia.  Patient was admitted to the medical ward, she received supplemental oxygen per nasal cannula, she was treated with remdesivir, systemic corticosteroids and Tocilizumab.  She received antitussive agents, bronchodilators and airway clearing techniques with incentive spirometer and flutter valve.  Patient responded well to medical therapy with improvement of her symptoms and inflammatory markers.  Her oximetry at discharge was 91% on room air.   2.  Non-ST elevation myocardial infarction, type II MI, complicated with cardiogenic pulmonary edema, acute.  Patient had no frank chest pain, her troponins were elevated, peaked at 1,961, further work-up with echocardiography showed a preserved LV systolic function, positive apical hypokinesis preserved RV function.  Case was consulted with cardiology, recommendations to continue anticoagulation with heparin for 48 hours.  Patient received also beta-blockade, aspirin and statin with good toleration.  Her LDL was 61.     3.  COPD with acute exacerbation.  Patient received bronchodilators along with systemic steroids, with improvement of her symptoms.  4.  Dyslipidemia.  Lipid profile with cholesterol 118, HDL 20, LDL 61, triglycerides 184.  Continue atorvastatin.  5.  Hyperkalemia.  Patient developed hyperkalemia, peaked at 5.5, her kidney function remained preserved, patient received medical therapy with sodium zirconium, along with insulin.  Follow-up potassium at discharge 4.1.   Discharge Diagnoses:  Principal Problem:   Pneumonia due to COVID-19 virus   Chronic obstructive pulmonary disease, unspecified ([place])   Acute respiratory failure with hypoxia (HCC)   NSTEMI (non-ST elevated myocardial infarction) ([place])   Dyslipidemia  History of Present Illness  09/06/2019  Pulmonary/ 1st office eval/Jessica Lewis    Chief Complaint  Patient presents with  . Consult    Pneumonia due to COVID-19 virus   Dyspnea:  Housework, mailbox 100 ft flat / no problem [employer] including across parking lot = [employer] = can't walk a nl pace on a flat grade s sob but does fine slow   Cough: none / some hoarseness with throat clearing  Sleep: on side / one pillows SABA use: none  02 :  Not using   No obvious day to day or daytime variability or assoc excess/ purulent sputum or mucus plugs or hemoptysis or cp or chest tightness, subjective wheeze or overt sinus or hb symptoms.   Sleeping as above  without nocturnal  or early am exacerbation  of respiratory  c/o's or need for noct saba. Also denies any obvious fluctuation of symptoms with weather or environmental changes or other aggravating or alleviating factors except as outlined above   No unusual exposure hx or h/o childhood pna/ asthma or knowledge of premature birth.  Current Allergies, Complete Past Medical History, Past Surgical History, Family History, and Social History were reviewed in [employer] record.  ROS  The following are not active complaints unless bolded Hoarseness, sore throat, dysphagia, dental problems, itching, sneezing,  nasal congestion or discharge of excess mucus or purulent secretions, ear ache,   fever, chills, sweats, unintended wt loss or wt gain, classically pleuritic or exertional cp,  orthopnea pnd or arm/hand swelling  or leg swelling, presyncope, palpitations, abdominal pain, anorexia, nausea, vomiting, diarrhea  or change in bowel habits or change in bladder habits, change in stools or change in urine, dysuria, hematuria,  rash, arthralgias, visual complaints, headache, numbness, weakness or ataxia or problems with walking or coordination,  change in mood or  memory.           Past Medical History:  Diagnosis Date  . Allergic rhinitis due to pollen   . Bronchitis   . Chronic obstructive pulmonary disease,  unspecified ([place])   . Cystocele 10/14/2015  . Headache(784.0)    Menstral migraines  . Osteoporosis   . Pneumonia due to COVID-19 virus   . Rectocele 11/16/2012  . Rosacea     Outpatient Medications Prior to Visit  Medication Sig Dispense Refill  . albuterol (VENTOLIN HFA) 108 (90 Base) MCG/ACT inhaler Inhale 2 puffs into the lungs every 6 (six) hours as needed for wheezing or shortness of breath.    . Ascorbic Acid (VITAMIN C) 500 MG CHEW Chew 1 tablet by mouth daily.    Marland Kitchen atorvastatin (LIPITOR) 20 MG tablet Take 0.5 tablets (10 mg total) by mouth daily at 6 PM. 45 tablet 3  . calcium carbonate (OS-CAL - DOSED IN MG OF ELEMENTAL CALCIUM) 1250 (500 Ca) MG tablet Take 1,000 mg by mouth daily.     . Fluticasone-Umeclidin-Vilant (TRELEGY ELLIPTA) 100-62.5-25 MCG/INH AEPB Inhale 1 puff into the lungs daily.    Marland Kitchen losartan (COZAAR) 25 MG tablet Take 0.5 tablets (12.5 mg total) by mouth daily. 45 tablet 3  . metoprolol tartrate (LOPRESSOR) 25 MG tablet Take 0.5 tablets (12.5 mg total) by mouth 2 (two) times daily. 90 tablet 1  . Multiple Vitamins-Minerals (ONE-A-DAY WOMENS PETITES PO) Take by mouth daily.    . Vitamin D, Ergocalciferol, (DRISDOL) 1.25 MG (50000 UNIT) CAPS capsule Take 50,000 Units by mouth every Tuesday.     . zinc gluconate 50 MG tablet Take 50  mg by mouth daily.        Objective:     BP 136/80 (BP Location: Left Arm, Patient Position: Sitting, Cuff Size: Normal)   Pulse 89   Temp (!) 97.1 F (36.2 C)   Ht 5\' 2"  (1.575 m)   Wt 215 lb 9.6 oz (97.8 kg)   SpO2 96% Comment: on room air  BMI 39.43 kg/m   SpO2: 96 %(on room air)    HEENT : pt wearing mask not removed for exam due to covid - 19 concerns.    NECK :  without JVD/Nodes/TM/ nl carotid upstrokes bilaterally   LUNGS: no acc muscle use,  Mild barrel  contour chest wall with bilateral  Distant bs s audible wheeze and  without cough on insp or exp maneuvers  and mild  Hyperresonant  to  percussion bilaterally      CV:  RRR  no s3 or murmur or increase in P2, and no edema   ABD:  soft and nontender with pos end  insp Hoover's  in the supine position. No bruits or organomegaly appreciated, bowel sounds nl  MS:   Nl gait/  ext warm without deformities, calf tenderness, cyanosis or clubbing No obvious joint restrictions   SKIN: warm and dry without lesions    NEURO:  alert, approp, nl sensorium with  no motor or cerebellar deficits apparent.       CXR PA and Lateral:   09/06/2019 :    I personally reviewed images and   impression as follows:   Improved non-specific markings back near baseline vs 09/12/2018      Assessment   COPD GOLD III  Quit smoking 1987  - PFT's 12/26/17  FEV1 0.88 (39 % ) ratio 0.63  p 0 % improvement from saba p ? prior to study with DLCO  19.69 (81%) corrects to 6.54 (136%)  for alv volume and FV curve classic curvature   - 09/06/2019   Walked RA x two laps =  approx 56ft @ slow  pace - stopped due to end of study with mild sob sats of 96 % at the end of the study  - The proper method of use, as well as anticipated side effects, of a metered-dose inhaler and DPI were  discussed and demonstrated to the patient.    Group D in terms of symptom/risk and laba/lama/ICS  therefore appropriate rx at this point >>>  Continue trelegy/ work on better technique for both dpi and hfa   >>> f/u q 3 m at [place] office planned    Acute respiratory failure with hypoxia ([place]) Related to covid 19/ resolved by walking sats 09/06/2019     Pneumonia due to COVID-19 virus She does have residual changes on cxr but s desats walking or reduction in baseline activity tol or cough so just needs to continue to increase activity as tolerance and monitor sats at peak ex.      Each maintenance medication was reviewed in detail including emphasizing most importantly the difference between maintenance and prns and under what circumstances the prns are to be triggered using an action plan format  where appropriate.  Total time for H and P, chart review, counseling, teaching device and generating customized AVS unique to this Initial office visit / charting = 45 min       Christinia Gully, MD 09/06/2019

## 2019-09-06 NOTE — Patient Instructions (Addendum)
Make sure you check your oxygen saturations at highest level of activity to be sure it stays over 90%   Work on inhaler technique:  relax and gently blow all the way out then take a nice smooth deep breath back in, triggering the inhaler at same time you start breathing in.  Hold for up to 5 seconds if you can.   Rinse and gargle with water when done - arm and hammer is the best toothpaste.  Please remember to go to the  x-ray department  for your tests - we will call you with the results when they are available   Please schedule a follow up visit in 3 months but call sooner if needed - [place] office is fine.

## 2019-09-07 ENCOUNTER — Encounter: Payer: Self-pay | Admitting: Internal Medicine

## 2019-09-07 NOTE — Assessment & Plan Note (Signed)
Quit smoking 1987  - PFT's 12/26/17  FEV1 0.88 (39 % ) ratio 0.63  p 0 % improvement from saba p ? prior to study with DLCO  19.69 (81%) corrects to 6.54 (136%)  for alv volume and FV curve classic curvature   - 09/06/2019   Walked RA x two laps =  approx 537ft @ slow  pace - stopped due to end of study with mild sob sats of 96 % at the end of the study  - The proper method of use, as well as anticipated side effects, of a metered-dose inhaler and DPI were  discussed and demonstrated to the patient.    Group D in terms of symptom/risk and laba/lama/ICS  therefore appropriate rx at this point >>>  Continue trelegy/ work on better technique for both dpi and hfa

## 2019-09-07 NOTE — Assessment & Plan Note (Signed)
She does have residual changes on cxr but s desats walking or reduction in baseline activity tol or cough so just needs to continue to increase activity as tolerance and monitor sats at peak ex.         Each maintenance medication was reviewed in detail including emphasizing most importantly the difference between maintenance and prns and under what circumstances the prns are to be triggered using an action plan format where appropriate.  Total time for H and P, chart review, counseling, teaching device and generating customized AVS unique to this Initial office visit / charting = 45 min

## 2019-09-07 NOTE — Assessment & Plan Note (Signed)
Related to covid 19/ resolved by walking sats 09/06/2019

## 2019-09-09 NOTE — Progress Notes (Signed)
Spoke with pt and notified of results per Dr. Wert. Pt verbalized understanding and denied any questions. 

## 2019-09-19 ENCOUNTER — Other Ambulatory Visit: Payer: Self-pay

## 2019-09-19 ENCOUNTER — Ambulatory Visit (HOSPITAL_COMMUNITY)
Admission: RE | Admit: 2019-09-19 | Discharge: 2019-09-19 | Disposition: A | Discharge status: Home / Self Care | Payer: Medicare Other | Source: Ambulatory Visit | Attending: Cardiology | Admitting: Cardiology

## 2019-09-19 DIAGNOSIS — I080 Rheumatic disorders of both mitral and aortic valves: Secondary | ICD-10-CM | POA: Diagnosis not present

## 2019-09-19 DIAGNOSIS — I429 Cardiomyopathy, unspecified: Secondary | ICD-10-CM | POA: Diagnosis not present

## 2019-09-19 DIAGNOSIS — I252 Old myocardial infarction: Secondary | ICD-10-CM | POA: Insufficient documentation

## 2019-09-19 DIAGNOSIS — J449 Chronic obstructive pulmonary disease, unspecified: Secondary | ICD-10-CM | POA: Insufficient documentation

## 2019-09-19 DIAGNOSIS — E785 Hyperlipidemia, unspecified: Secondary | ICD-10-CM | POA: Diagnosis not present

## 2019-09-19 DIAGNOSIS — Z8616 Personal history of COVID-19: Secondary | ICD-10-CM | POA: Diagnosis not present

## 2019-09-19 DIAGNOSIS — E669 Obesity, unspecified: Secondary | ICD-10-CM | POA: Insufficient documentation

## 2019-09-19 NOTE — Progress Notes (Signed)
*  PRELIMINARY RESULTS* Echocardiogram 2D Echocardiogram has been performed.  Jessica Lewis 09/19/2019, 1:43 PM

## 2019-09-20 ENCOUNTER — Telehealth: Payer: Self-pay | Admitting: *Deleted

## 2019-09-20 NOTE — Telephone Encounter (Signed)
-----   Message from Satira Sark, MD sent at 09/19/2019  3:47 PM EDT ----- Results reviewed.  Please let her know that cardiac function is now normal, LVEF 55 to 60%.  We can discuss further at office follow-up.

## 2019-09-20 NOTE — Telephone Encounter (Signed)
Patient informed. Copy sent to PCP °

## 2019-09-27 ENCOUNTER — Encounter: Payer: Self-pay | Admitting: Cardiology

## 2019-09-27 ENCOUNTER — Ambulatory Visit: Payer: Medicare Other | Admitting: Cardiology

## 2019-09-27 VITALS — BP 179/76 | HR 83 | Temp 98.0°F | Ht 64.0 in | Wt 218.4 lb

## 2019-09-27 DIAGNOSIS — I700 Atherosclerosis of aorta: Secondary | ICD-10-CM

## 2019-09-27 DIAGNOSIS — I429 Cardiomyopathy, unspecified: Secondary | ICD-10-CM

## 2019-09-27 MED ORDER — LOSARTAN POTASSIUM 25 MG PO TABS: 25.0000 mg | ORAL_TABLET | Freq: Every day | ORAL | 3 refills | Status: DC

## 2019-09-27 NOTE — Patient Instructions (Signed)
Medication Instructions:   Your physician has recommended you make the following change in your medication:   Increase losartan to 25 mg by mouth daily  Continue other medications the same  Labwork:  NONE  Testing/Procedures:  NONE  Follow-Up:  Your physician recommends that you schedule a follow-up appointment in: 6 months (office). You will receive a reminder letter in the mail in about 4 months reminding you to call and schedule your appointment. If you don't receive this letter, please contact our office.  Any Other Special Instructions Will Be Listed Below (If Applicable).  If you need a refill on your cardiac medications before your next appointment, please call your pharmacy.

## 2019-09-27 NOTE — Progress Notes (Signed)
Cardiology Office Note  Date: 09/27/2019   ID: Staysha, Feith 1946/06/03, MRN [id number]  PCP:  Maryruth Hancock, MD  Cardiologist:  Rozann Lesches, MD Electrophysiologist:  None   Chief Complaint  Patient presents with  . Cardiac follow-up    History of Present Illness: Jessica Lewis is a 74 y.o. female last seen in February.  She presents for a follow-up visit.  States that she has gotten back to baseline after about with COVID-19 pneumonia, discussed in the previous note.  Back to usual ADLs, no worsening shortness of breath, no orthopnea or PND, no chest pain.  Follow-up echocardiogram done recently showed normal LVEF at 55 to 60% without regional wall motion abnormalities, mild diastolic dysfunction, normal RV contraction.  This represents an improvement overall.  She states that she has been compliant with her medications as outlined below.  I did review home blood pressure checks with systolics ranging from the 120s up to the 150s.  We discussed increasing losartan to 25 mg daily.  Past Medical History:  Diagnosis Date  . Allergic rhinitis due to pollen   . Bronchitis   . Chronic obstructive pulmonary disease, unspecified ([place])   . Cystocele 10/14/2015  . Headache(784.0)    Menstral migraines  . Osteoporosis   . Pneumonia due to COVID-19 virus   . Rectocele 11/16/2012  . Rosacea     Past Surgical History:  Procedure Laterality Date  . CHOLECYSTECTOMY    . COLONOSCOPY  02/09/2011   Procedure: COLONOSCOPY;  Surgeon: Rogene Houston, MD;  Location: [facility];  Service: Endoscopy;  Laterality: N/A;  . TUBAL LIGATION Bilateral     Current Outpatient Medications  Medication Sig Dispense Refill  . albuterol (VENTOLIN HFA) 108 (90 Base) MCG/ACT inhaler Inhale 2 puffs into the lungs every 6 (six) hours as needed for wheezing or shortness of breath.    . Ascorbic Acid (VITAMIN C) 500 MG CHEW Chew 1 tablet by mouth daily.    Marland Kitchen aspirin EC 81 MG tablet Take 81 mg by  mouth daily.    Marland Kitchen atorvastatin (LIPITOR) 20 MG tablet Take 0.5 tablets (10 mg total) by mouth daily at 6 PM. 45 tablet 3  . calcium carbonate (OS-CAL - DOSED IN MG OF ELEMENTAL CALCIUM) 1250 (500 Ca) MG tablet Take 1,000 mg by mouth daily.     . Fluticasone-Umeclidin-Vilant (TRELEGY ELLIPTA) 100-62.5-25 MCG/INH AEPB Inhale 1 puff into the lungs daily.    Marland Kitchen losartan (COZAAR) 25 MG tablet Take 1 tablet (25 mg total) by mouth daily. 90 tablet 3  . metoprolol tartrate (LOPRESSOR) 25 MG tablet Take 0.5 tablets (12.5 mg total) by mouth 2 (two) times daily. 90 tablet 1  . Multiple Vitamins-Minerals (ONE-A-DAY WOMENS PETITES PO) Take by mouth daily.    . Vitamin D, Ergocalciferol, (DRISDOL) 1.25 MG (50000 UNIT) CAPS capsule Take 50,000 Units by mouth every Tuesday.     . zinc gluconate 50 MG tablet Take 50 mg by mouth daily.     No current facility-administered medications for this visit.   Allergies:  Codeine, Tetracyclines & related, and Claritin [loratadine]   ROS:   No palpitations or syncope.  Physical Exam: VS:  BP (!) 179/76   Pulse 83   Temp 98 F (36.7 C)   Ht 5\' 4"  (1.626 m)   Wt 218 lb 6.4 oz (99.1 kg)   SpO2 90%   BMI 37.49 kg/m , BMI Body mass index is 37.49 kg/m.  Wt  Readings from Last 3 Encounters:  09/27/19 218 lb 6.4 oz (99.1 kg)  09/06/19 215 lb 9.6 oz (97.8 kg)  08/07/19 212 lb (96.2 kg)    General: Patient appears comfortable at rest. HEENT: Conjunctiva and lids normal, wearing a mask. Neck: Supple, no elevated JVP or carotid bruits, no thyromegaly. Lungs: Clear to auscultation, nonlabored breathing at rest. Cardiac: Regular rate and rhythm, no S3, soft systolic murmur. Abdomen: Soft, bowel sounds present. Extremities: No pitting edema, distal pulses 2+.  ECG:  An ECG dated 07/18/2019 was personally reviewed today and demonstrated:  Sinus rhythm with PAC, nonspecific T wave changes, decreased R wave progression without definitive infarct pattern.  Recent  Labwork: 07/18/2019: Magnesium 2.1 08/16/2019: ALT 43; AST 33; BUN 18; Creatinine, Ser 0.72; Hemoglobin 15.1; Platelets 293; Potassium 4.7; Sodium 142     Component Value Date/Time   CHOL 118 07/22/2019 1415   TRIG 184 (H) 07/22/2019 1415   HDL 20 (L) 07/22/2019 1415   CHOLHDL 5.9 07/22/2019 1415   VLDL 37 07/22/2019 1415   LDLCALC 61 07/22/2019 1415    Other Studies Reviewed Today:  Echocardiogram 09/19/2019: 1. Left ventricular ejection fraction, by estimation, is 55 to 60%. The  left ventricle has normal function. The left ventricle has no regional  wall motion abnormalities. There is mild concentric left ventricular  hypertrophy. Left ventricular diastolic  parameters are consistent with Grade I diastolic dysfunction (impaired  relaxation).  2. Right ventricular systolic function is normal. The right ventricular  size is normal.  3. Left atrial size was mildly dilated.  4. The mitral valve is grossly normal. Mild mitral valve regurgitation.  5. The aortic valve is tricuspid. Aortic valve regurgitation is not  visualized. Mild aortic valve sclerosis is present, with no evidence of  aortic valve stenosis.  6. The inferior vena cava is normal in size with greater than 50%  respiratory variability, suggesting right atrial pressure of 3 mmHg.   Assessment and Plan:  1.  Suspected myocarditis in the setting of COVID-19 pneumonia as discussed in the previous office note.  She has recovered back to baseline and follow-up echocardiogram shows normal LVEF at 55 to 60% with no wall motion abnormalities.  At this point plan is to continue medical therapy and observation.  Continue Lopressor, increase Cozaar to 25 mg daily.  2.  Incidentally noted aortic atherosclerosis by CT imaging.  She is asymptomatic, no obvious angina symptoms.  Continue observation on aspirin and statin.  Medication Adjustments/Labs and Tests Ordered: Current medicines are reviewed at length with the patient  today.  Concerns regarding medicines are outlined above.   Tests Ordered: No orders of the defined types were placed in this encounter.   Medication Changes: Meds ordered this encounter  Medications  . losartan (COZAAR) 25 MG tablet    Sig: Take 1 tablet (25 mg total) by mouth daily.    Dispense:  90 tablet    Refill:  3    09/27/2019 dose increase    Disposition:  Follow up 6 months in the [place] office.  Signed, Satira Sark, MD, [facility] 09/27/2019 1:57 PM    [facility] [address], [place], [place] [address] Phone: [phone]; Fax: [phone]

## 2019-10-22 ENCOUNTER — Encounter: Payer: Self-pay | Admitting: Adult Health

## 2019-10-22 ENCOUNTER — Other Ambulatory Visit (HOSPITAL_COMMUNITY)
Admission: RE | Admit: 2019-10-22 | Discharge: 2019-10-22 | Disposition: A | Discharge status: Home / Self Care | Payer: Medicare Other | Source: Ambulatory Visit | Attending: Adult Health | Admitting: Adult Health

## 2019-10-22 ENCOUNTER — Other Ambulatory Visit: Payer: Self-pay

## 2019-10-22 ENCOUNTER — Ambulatory Visit (INDEPENDENT_AMBULATORY_CARE_PROVIDER_SITE_OTHER): Payer: Medicare Other | Admitting: Adult Health

## 2019-10-22 VITALS — BP 137/64 | HR 87 | Ht 62.0 in | Wt 221.0 lb

## 2019-10-22 DIAGNOSIS — Z124 Encounter for screening for malignant neoplasm of cervix: Secondary | ICD-10-CM

## 2019-10-22 DIAGNOSIS — Z1212 Encounter for screening for malignant neoplasm of rectum: Secondary | ICD-10-CM | POA: Diagnosis not present

## 2019-10-22 DIAGNOSIS — Z780 Asymptomatic menopausal state: Secondary | ICD-10-CM | POA: Insufficient documentation

## 2019-10-22 DIAGNOSIS — Z1151 Encounter for screening for human papillomavirus (HPV): Secondary | ICD-10-CM | POA: Insufficient documentation

## 2019-10-22 DIAGNOSIS — Z01419 Encounter for gynecological examination (general) (routine) without abnormal findings: Secondary | ICD-10-CM

## 2019-10-22 DIAGNOSIS — N816 Rectocele: Secondary | ICD-10-CM

## 2019-10-22 DIAGNOSIS — Z1211 Encounter for screening for malignant neoplasm of colon: Secondary | ICD-10-CM | POA: Diagnosis not present

## 2019-10-22 DIAGNOSIS — Z1272 Encounter for screening for malignant neoplasm of vagina: Secondary | ICD-10-CM

## 2019-10-22 DIAGNOSIS — N8110 Cystocele, unspecified: Secondary | ICD-10-CM

## 2019-10-22 LAB — HEMOCCULT GUIAC POC 1CARD (OFFICE): Fecal Occult Blood, POC: NEGATIVE

## 2019-10-22 NOTE — Progress Notes (Signed)
Patient ID: Jessica Lewis, female   DOB: May 26, 1946, 74 y.o.   MRN: [id number] History of Present Illness: Jessica Lewis is a 74 year old white female,married, PM in for well woman gyn exam and pap. PCP is Dr Nevada Crane.   Current Medications, Allergies, Past Medical History, Past Surgical History, Family History and Social History were reviewed in [employer] record.     Review of Systems:  Patient denies any headaches, hearing loss, fatigue, blurred vision, chest pain, abdominal pain, problems with bowel movements, urination, or intercourse(not active). No joint pain or mood swings. Still has shortness of breath sp COVID(January 2021), had pneumonia, has gotten one vaccine next 4/30.   Physical Exam:BP 137/64 (BP Location: Left Arm, Patient Position: Sitting, Cuff Size: Large)   Pulse 87   Ht 5\' 2"  (1.575 m)   Wt 221 lb (100.2 kg)   BMI 40.42 kg/m  General:  Well developed, well nourished, no acute distress Skin:  Warm and dry Neck:  Midline trachea, normal thyroid, good ROM, no lymphadenopathy,no carotid bruits heard Lungs; Clear to auscultation bilaterally Breast:  No dominant palpable mass, retraction, or nipple discharge Cardiovascular: Regular rate and rhythm Abdomen:  Soft, non tender, no hepatosplenomegaly Pelvic:  External genitalia is normal in appearance, no lesions.  The vagina is pale with loss of moisture and rugae, +cystocele. Urethra has no lesions or masses. The cervix is smooth, pap with high risk HPV 16/18 genotyping performed .  Uterus is felt to be normal size, shape, and contour.  No adnexal masses or tenderness noted.Bladder is non tender, no masses felt. Rectal: Good sphincter tone, no polyps, or hemorrhoids felt.  Hemoccult negative. Extremities/musculoskeletal:  No swelling or varicosities noted, no clubbing or cyanosis Psych:  No mood changes, alert and cooperative,seems happy AA 0 Fall risk is low PHQ 9 score is 0 Examination chaperoned by Diona Fanti CMA  Impression and plan:   1. Routine cervical smear Pap sent This is her last pap, as long as this one is normal  2. Encounter for gynecological examination with Papanicolaou smear of cervix Pap sent  Physical with PCP Labs with PCP Get mammogram yearly Follow up with me as needed   3. Screening for colorectal cancer Colonoscopy per GI  4. Cystocele without uterine prolapse Discussed pessary but she says she is fine for now.  5. Rectocele

## 2019-10-24 LAB — CYTOLOGY - PAP
Comment: NEGATIVE
Diagnosis: NEGATIVE
High risk HPV: NEGATIVE

## 2019-10-28 ENCOUNTER — Encounter: Payer: Self-pay | Admitting: Family Medicine

## 2019-10-28 ENCOUNTER — Other Ambulatory Visit: Payer: Self-pay | Admitting: Family Medicine

## 2019-10-28 ENCOUNTER — Ambulatory Visit (INDEPENDENT_AMBULATORY_CARE_PROVIDER_SITE_OTHER): Payer: Medicare Other | Admitting: Family Medicine

## 2019-10-28 ENCOUNTER — Other Ambulatory Visit: Payer: Self-pay

## 2019-10-28 VITALS — BP 144/73 | HR 77 | Temp 97.7°F | Ht 62.0 in | Wt 220.0 lb

## 2019-10-28 DIAGNOSIS — J449 Chronic obstructive pulmonary disease, unspecified: Secondary | ICD-10-CM | POA: Diagnosis not present

## 2019-10-28 DIAGNOSIS — M8580 Other specified disorders of bone density and structure, unspecified site: Secondary | ICD-10-CM | POA: Diagnosis not present

## 2019-10-28 DIAGNOSIS — E559 Vitamin D deficiency, unspecified: Secondary | ICD-10-CM | POA: Insufficient documentation

## 2019-10-28 DIAGNOSIS — E875 Hyperkalemia: Secondary | ICD-10-CM | POA: Diagnosis not present

## 2019-10-28 DIAGNOSIS — M810 Age-related osteoporosis without current pathological fracture: Secondary | ICD-10-CM

## 2019-10-28 NOTE — Patient Instructions (Signed)
° ° ° °  If you have lab work done today you will be contacted with your lab results within the next 2 weeks.  If you have not heard from us then please contact us. The fastest way to get your results is to register for My Chart. ° ° °IF you received an x-ray today, you will receive an invoice from [facility]. Please contact [facility] at [phone] with questions or concerns regarding your invoice.  ° °IF you received labwork today, you will receive an invoice from [facility]. Please contact [facility] at [phone] with questions or concerns regarding your invoice.  ° °Our billing staff will not be able to assist you with questions regarding bills from these companies. ° °You will be contacted with the lab results as soon as they are available. The fastest way to get your results is to activate your My Chart account. Instructions are located on the last page of this paperwork. If you have not heard from us regarding the results in 2 weeks, please contact this office. °  ° ° ° °

## 2019-10-28 NOTE — Progress Notes (Signed)
Established Patient Office Visit  Subjective:  Patient ID: Jessica Lewis, female    DOB: Aug 04, 1945  Age: 74 y.o. MRN: [id number]  CC:  Chief Complaint  Patient presents with  . Follow-up    3 month f/u on Hyperkalemia. Would like a refill on Vit d2 1.25mg     HPI Jessica Lewis presents for Potassium-4.7-2/12/21, Vit D-09/2017-35.2-pulmonary evaluation-pft in June 2021  Assessment and Plan: Cardiology 3/21  1.  Suspected myocarditis in the setting of COVID-19 pneumonia as discussed in the previous office note.  She has recovered back to baseline and follow-up echocardiogram shows normal LVEF at 55 to 60% with no wall motion abnormalities.  At this point plan is to continue medical therapy and observation.  Continue Lopressor, increase Cozaar to 25 mg daily.  2.  Incidentally noted aortic atherosclerosis by CT imaging.  She is asymptomatic, no obvious angina symptoms.  Continue observation on aspirin and statin.  Echocardiogram 09/19/2019: 1. Left ventricular ejection fraction, by estimation, is 55 to 60%. The  left ventricle has normal function. The left ventricle has no regional  wall motion abnormalities. There is mild concentric left ventricular  hypertrophy. Left ventricular diastolic  parameters are consistent with Grade I diastolic dysfunction (impaired  relaxation).  2. Right ventricular systolic function is normal. The right ventricular  size is normal.  3. Left atrial size was mildly dilated.  4. The mitral valve is grossly normal. Mild mitral valve regurgitation.  5. The aortic valve is tricuspid. Aortic valve regurgitation is not  visualized. Mild aortic valve sclerosis is present, with no evidence of  aortic valve stenosis.  6. The inferior vena cava is normal in size with greater than 50%  Pulmonary note .Acute hypoxic respiratory failure due to SARS COVID-19 viral pneumonia. Patient was admitted to the medical ward, she received supplemental oxygen per  nasal cannula, she was treated with remdesivir, systemic corticosteroids and Tocilizumab. She received antitussive agents, bronchodilators and airway clearing techniques with incentive spirometer and flutter valve.  Patient responded well to medical therapy with improvement of her symptoms and inflammatory markers. Her oximetry at discharge was 91% on room air.   2.Non-ST elevation myocardial infarction, type II MI, complicated with cardiogenic pulmonary edema, acute.Patient had no frank chest pain, her troponins were elevated,peaked at 1,961,further work-up with echocardiography showed a preserved LV systolic function, positive apical hypokinesis preserved RV function. Case was consulted with cardiology, recommendations to continue anticoagulation with heparin for 48 hours. Patient received also beta-blockade, aspirin and statin with good toleration. Her LDL was 61.  3.COPD with acute exacerbation.Patient received bronchodilators along with systemic steroids, with improvement of her symptoms.  4.Dyslipidemia.Lipid profile with cholesterol 118, HDL 20, LDL 61, triglycerides 184. Continue atorvastatin.  5.Hyperkalemia.Patient developed hyperkalemia, peaked at 5.5, her kidney function remained preserved, patient received medical therapy with sodium zirconium, along with insulin. Follow-up potassium at discharge 4.1.   Discharge Diagnoses: Principal Problem: Pneumonia due to COVID-19 virus Chronic obstructive pulmonary disease, unspecified ([place]) Acute respiratory failure with hypoxia (HCC) NSTEMI (non-ST elevated myocardial infarction) (HCC) Dyslipidemia   COPD GOLD III  Quit smoking 1987  - PFT's 12/26/17  FEV1 0.88 (39 % ) ratio 0.63  p 0 % improvement from saba p ? prior to study with DLCO  19.69 (81%) corrects to 6.54 (136%)  for alv volume and FV curve classic curvature   - 09/06/2019   Walked RA x two laps =  approx 561ft @ slow  pace - stopped  due to end  of study with mild sob sats of 96 % at the end of the study  - The proper method of use, as well as anticipated side effects, of a metered-dose inhaler and DPI were  discussed and demonstrated to the patient.    Group D in terms of symptom/risk and laba/lama/ICS  therefore appropriate rx at this point >>>  Continue trelegy/ work on better technique for both dpi and hfa   >>> f/u q 3 m at [place] office planned    Acute respiratory failure with hypoxia ([place]) Related to covid 19/ resolved by walking sats 09/06/2019     Pneumonia due to COVID-19 virus She does have residual changes on cxr but s desats walking or reduction in baseline activity tol or cough so just needs to continue to increase activity as tolerance and monitor sats at peak ex.      Each maintenance medication was reviewed in detail including emphasizing most importantly the difference between maintenance and prns and under what circumstances the prns are to be triggered using an action plan format where appropriate.  Total time for H and P, chart review, counseling, teaching device and generating customized AVS unique to this Initial office visit / charting = 45 min       Jessica Gully, MD 09/06/2019      Assessment & Plan Note by Tanda Rockers, MD at 09/07/2019 7:43 AM Author: Tanda Rockers, MD Author Type: Physician Filed: 09/07/2019 7:44 AM  Note Status: Written Cosign: Cosign Not Required Encounter Date: 09/06/2019  Problem: Pneumonia due to COVID-19 virus  Editor: Tanda Rockers, MD (Physician)    She does have residual changes on cxr but s desats walking or reduction in baseline activity tol or cough so just needs to continue to increase activity as tolerance and monitor sats at peak ex.         Each maintenance medication was reviewed in detail including emphasizing most importantly the difference between maintenance and prns and under what circumstances the prns are to be triggered  using an action plan format where appropriate.  Total time for H and P, chart review, counseling, teaching device and generating customized AVS unique to this Initial office visit / charting = 45 min         Assessment & Plan Note by Tanda Rockers, MD at 09/07/2019 7:42 AM Author: Tanda Rockers, MD Author Type: Physician Filed: 09/07/2019 7:42 AM  Note Status: Written Cosign: Cosign Not Required Encounter Date: 09/06/2019  Problem: Acute respiratory failure with hypoxia ([place])  Editor: Tanda Rockers, MD (Physician)    Related to covid 19/ resolved by walking sats 09/06/2019     Assessment & Plan Note by Tanda Rockers, MD at 09/07/2019 7:40 AM Author: Tanda Rockers, MD Author Type: Physician Filed: 09/07/2019 7:42 AM  Note Status: Written Cosign: Cosign Not Required Encounter Date: 09/06/2019  Problem: COPD GOLD III   Editor: Tanda Rockers, MD (Physician)    Quit smoking 1987  - PFT's 12/26/17  FEV1 0.88 (39 % ) ratio 0.63  p 0 % improvement from saba p ? prior to study with DLCO  19.69 (81%) corrects to 6.54 (136%)  for alv volume and FV curve classic curvature   - 09/06/2019   Walked RA x two laps =  approx 553ft @ slow  pace - stopped due to end of study with mild sob sats of 96 % at the end of the study  - The proper  method of use, as well as anticipated side effects, of a metered-dose inhaler and DPI were  discussed and demonstrated to the patient.    Group D in terms of symptom/risk and laba/lama/ICS  therefore appropriate rx at this point >>>  Continue trelegy/ work on better technique for both dpi and hfa          Patient Instructions by Tanda Rockers, MD at 09/06/2019 2:00 PM Author: Tanda Rockers, MD Author Type: Physician Filed: 09/06/2019 2:35 PM  Note Status: Addendum Cosign: Cosign Not Required Encounter Date: 09/06/2019  Editor: Tanda Rockers, MD (Physician)  Prior Versions: 1. Tanda Rockers, MD (Physician) at 09/06/2019 2:32 PM - Signed    Make sure you  check your oxygen saturations at highest level of activity to be sure it stays over 90%   Work on inhaler technique:  relax and gently blow all the way out then take a nice smooth deep breath back in, triggering the inhaler at same time you start breathing in.  Hold for up to 5 seconds if you can.   Rinse and gargle with water when done - arm and hammer is the best toothpaste.  Please remember to go to the  x-ray department  for your tests - we will call you with the results when they are available   Please schedule a follow up visit in 3 months but call sooner if needed - [place] office is fine.             Past Medical History:  Diagnosis Date  . Allergic rhinitis due to pollen   . Bronchitis   . Chronic obstructive pulmonary disease, unspecified ([place])   . Cystocele 10/14/2015  . Headache(784.0)    Menstral migraines  . Osteoporosis   . Pneumonia due to COVID-19 virus   . Rectocele 11/16/2012  . Rosacea     Past Surgical History:  Procedure Laterality Date  . CHOLECYSTECTOMY    . COLONOSCOPY  02/09/2011   Procedure: COLONOSCOPY;  Surgeon: Rogene Houston, MD;  Location: [facility];  Service: Endoscopy;  Laterality: N/A;  . TUBAL LIGATION Bilateral     Family History  Problem Relation Age of Onset  . Stroke Mother     Social History   Socioeconomic History  . Marital status: Married    Spouse name: Not on file  . Number of children: Not on file  . Years of education: Not on file  . Highest education level: Not on file  Occupational History  . Not on file  Tobacco Use  . Smoking status: Former Smoker    Types: Cigarettes    Quit date: 1987    Years since quitting: 34.3  . Smokeless tobacco: Never Used  Substance and Sexual Activity  . Alcohol use: No  . Drug use: No  . Sexual activity: Not Currently    Birth control/protection: Post-menopausal, Surgical    Comment: tubal  Other Topics Concern  . Not on file  Social History Narrative  . Not  on file   Social Determinants of Health   Financial Resource Strain: Low Risk   . Difficulty of Paying Living Expenses: Not hard at all  Food Insecurity: No Food Insecurity  . Worried About Charity fundraiser in the Last Year: Never true  . Ran Out of Food in the Last Year: Never true  Transportation Needs: No Transportation Needs  . Lack of Transportation (Medical): No  . Lack of Transportation (Non-Medical): No  Physical Activity: Insufficiently Active  . Days of Exercise per Week: 2 days  . Minutes of Exercise per Session: 20 min  Stress: No Stress Concern Present  . Feeling of Stress : Only a little  Social Connections: Not Isolated  . Frequency of Communication with Friends and Family: More than three times a week  . Frequency of Social Gatherings with Friends and Family: Three times a week  . Attends Religious Services: More than 4 times per year  . Active Member of Clubs or Organizations: Yes  . Attends Archivist Meetings: More than 4 times per year  . Marital Status: Married  Human resources officer Violence: Not At Risk  . Fear of Current or Ex-Partner: No  . Emotionally Abused: No  . Physically Abused: No  . Sexually Abused: No    Outpatient Medications Prior to Visit  Medication Sig Dispense Refill  . albuterol (VENTOLIN HFA) 108 (90 Base) MCG/ACT inhaler Inhale 2 puffs into the lungs every 6 (six) hours as needed for wheezing or shortness of breath.    . Ascorbic Acid (VITAMIN C) 500 MG CHEW Chew 1 tablet by mouth daily.    Marland Kitchen aspirin EC 81 MG tablet Take 81 mg by mouth daily.    . calcium carbonate (OS-CAL - DOSED IN MG OF ELEMENTAL CALCIUM) 1250 (500 Ca) MG tablet Take 1,000 mg by mouth daily.     . Fluticasone-Umeclidin-Vilant (TRELEGY ELLIPTA) 100-62.5-25 MCG/INH AEPB Inhale 1 puff into the lungs daily.    Marland Kitchen losartan (COZAAR) 25 MG tablet Take 1 tablet (25 mg total) by mouth daily. 90 tablet 3  . metoprolol tartrate (LOPRESSOR) 25 MG tablet Take 12.5 mg by  mouth 2 (two) times daily. Take 0.5 tab twice a day    . Multiple Vitamins-Minerals (ONE-A-DAY WOMENS PETITES PO) Take by mouth daily.    . Vitamin D, Ergocalciferol, (DRISDOL) 1.25 MG (50000 UNIT) CAPS capsule Take 50,000 Units by mouth every Tuesday.     . zinc gluconate 50 MG tablet Take 50 mg by mouth daily.    Marland Kitchen atorvastatin (LIPITOR) 20 MG tablet Take 0.5 tablets (10 mg total) by mouth daily at 6 PM. 45 tablet 3  . metoprolol tartrate (LOPRESSOR) 25 MG tablet Take 0.5 tablets (12.5 mg total) by mouth 2 (two) times daily. 90 tablet 1  . metoprolol tartrate (LOPRESSOR) 25 MG tablet Take 25 mg by mouth 2 (two) times daily.     No facility-administered medications prior to visit.    Allergies  Allergen Reactions  . Codeine Nausea And Vomiting  . Tetracyclines & Related Rash  . Claritin [Loratadine] Hives    ROS Review of Systems  Respiratory:       Pulmonary following-3 months f/u  Cardiovascular:       Cardio following-stable echo completed  Gastrointestinal: Negative.   Musculoskeletal: Positive for arthralgias.       Concern for vitd level-2019 last level      Objective:    Physical Exam  BP (!) 144/73 (BP Location: Right Arm, Patient Position: Sitting, Cuff Size: Large)   Pulse 77   Temp 97.7 F (36.5 C) (Temporal)   Ht 5\' 2"  (1.575 m)   Wt 220 lb (99.8 kg)   SpO2 95%   BMI 40.24 kg/m  Wt Readings from Last 3 Encounters:  10/28/19 220 lb (99.8 kg)  10/22/19 221 lb (100.2 kg)  09/27/19 218 lb 6.4 oz (99.1 kg)     Health Maintenance Due  Topic Date Due  .  Hepatitis C Screening  Never done  . COVID-19 Vaccine (1) Never done    Lab Results  Component Value Date   TSH 2.09 05/19/2016   Lab Results  Component Value Date   WBC 8.2 08/16/2019   HGB 15.1 08/16/2019   HCT 47.3 (H) 08/16/2019   MCV 92 08/16/2019   PLT 293 08/16/2019   Lab Results  Component Value Date   NA 142 08/16/2019   K 4.7 08/16/2019   CO2 26 08/16/2019   GLUCOSE 108 (H)  08/16/2019   BUN 18 08/16/2019   CREATININE 0.72 08/16/2019   BILITOT 0.4 08/16/2019   ALKPHOS 89 08/16/2019   AST 33 08/16/2019   ALT 43 (H) 08/16/2019   PROT 6.3 08/16/2019   ALBUMIN 3.9 08/16/2019   CALCIUM 9.2 08/16/2019   ANIONGAP 9 07/22/2019   Lab Results  Component Value Date   CHOL 118 07/22/2019   Lab Results  Component Value Date   HDL 20 (L) 07/22/2019   Lab Results  Component Value Date   LDLCALC 61 07/22/2019   Lab Results  Component Value Date   TRIG 184 (H) 07/22/2019   Lab Results  Component Value Date   CHOLHDL 5.9 07/22/2019   Lab Results  Component Value Date   HGBA1C 5.4 07/18/2019      Assessment & Plan:  1. Vitamin D deficiency High dose weekly previously-no recent levels - VITAMIN D 25 Hydroxy (Vit-D Deficiency, Fractures)  2. Hyperkalemia bmp  3. Chronic obstructive pulmonary disease, unspecified COPD type ([place]) Pulmonary following  4. Age-related osteoporosis without current pathological fracture Vit d level Follow-up: f/u Dr. Melvyn Novas abnormal CT-post COVID  22min spent discussing specialist evaluation , ongoing evaluation post COVID, current history, exam, assessment LISA Hannah Beat, MD

## 2019-10-29 LAB — VITAMIN D 25 HYDROXY (VIT D DEFICIENCY, FRACTURES): Vit D, 25-Hydroxy: 45.7 ng/mL (ref 30.0–100.0)

## 2019-10-30 ENCOUNTER — Ambulatory Visit: Payer: Medicare Other | Admitting: Family Medicine

## 2019-10-31 ENCOUNTER — Encounter: Payer: Self-pay | Admitting: Emergency Medicine

## 2019-10-31 NOTE — Progress Notes (Signed)
Lab letter mailed. 

## 2019-11-12 DIAGNOSIS — I10 Essential (primary) hypertension: Secondary | ICD-10-CM | POA: Diagnosis not present

## 2019-11-12 DIAGNOSIS — Z0189 Encounter for other specified special examinations: Secondary | ICD-10-CM | POA: Diagnosis not present

## 2019-11-12 DIAGNOSIS — E782 Mixed hyperlipidemia: Secondary | ICD-10-CM | POA: Diagnosis not present

## 2019-11-12 DIAGNOSIS — J449 Chronic obstructive pulmonary disease, unspecified: Secondary | ICD-10-CM | POA: Diagnosis not present

## 2019-11-13 ENCOUNTER — Other Ambulatory Visit: Payer: Self-pay | Admitting: Family Medicine

## 2019-12-03 ENCOUNTER — Other Ambulatory Visit (HOSPITAL_COMMUNITY): Payer: Self-pay | Admitting: Obstetrics and Gynecology

## 2019-12-03 DIAGNOSIS — Z1231 Encounter for screening mammogram for malignant neoplasm of breast: Secondary | ICD-10-CM

## 2019-12-09 ENCOUNTER — Ambulatory Visit: Payer: Medicare Other | Admitting: Internal Medicine

## 2019-12-09 DIAGNOSIS — M2042 Other hammer toe(s) (acquired), left foot: Secondary | ICD-10-CM | POA: Diagnosis not present

## 2019-12-09 DIAGNOSIS — M778 Other enthesopathies, not elsewhere classified: Secondary | ICD-10-CM | POA: Diagnosis not present

## 2019-12-09 DIAGNOSIS — M79675 Pain in left toe(s): Secondary | ICD-10-CM | POA: Diagnosis not present

## 2019-12-09 DIAGNOSIS — M79672 Pain in left foot: Secondary | ICD-10-CM | POA: Diagnosis not present

## 2019-12-09 DIAGNOSIS — M7662 Achilles tendinitis, left leg: Secondary | ICD-10-CM | POA: Diagnosis not present

## 2019-12-16 ENCOUNTER — Ambulatory Visit: Payer: Medicare Other | Admitting: Internal Medicine

## 2019-12-16 ENCOUNTER — Encounter: Payer: Self-pay | Admitting: Internal Medicine

## 2019-12-16 ENCOUNTER — Other Ambulatory Visit: Payer: Self-pay

## 2019-12-16 DIAGNOSIS — J449 Chronic obstructive pulmonary disease, unspecified: Secondary | ICD-10-CM

## 2019-12-16 NOTE — Progress Notes (Signed)
Jessica Lewis, female    DOB: [birth year]     MRN: [id number]   Brief patient profile:  74 yowf quit smoking 1987 with freq bronchitis which seemed better p quit then more freq in 2018 with pfts 12/26/17 c/w COPD GOLD  III  And started on trelegy which seemed to help  doe/ bronchitis flares  then Dec 31st 2020 acute sob/ fever dx covid 19>>>    Admit date: 07/16/2019 Discharge date: 07/22/2019   Brief/Interim Summary: 74 year old female who presented with dyspnea, cough and fever x about 2 weeks. On her initial physical examination her oximetry was 75% on room air, respiratory rate 30, heart rate 110, her lungs had scattered rhonchi, heart S1-S2 present, rhythmic, abdomen soft, no lower extremity edema. Sodium 134, potassium 3.6, chloride 100, bicarb 25, glucose 131, BUN 20, creatinine 0.72,troponin I(HS)109,white count 15.0, hemoglobin 12.3, hematocrit 37.0, platelets 398.Chest radiograph with bilateral upper lobes interstitial infiltrates.Initial EKG rate 112 bpm, normal axis, normal intervals, sinus rhythm, ST elevation in lead III, aVF, V5 and V6, small Q-wave lead II, III and aVF, no significant T wave abnormalities. Follow-up EKG with lower heart rate with resolution of ST elevations.  Patient was admitted to the hospital working diagnosis of acute hypoxic respiratory failure due to SARS COVID-19 viral pneumonia.  Patient with worsening troponin and apical LV hypokinesis, medical therapy for NSTEMI / MI type 2. Complicated with cardiogenic pulmonary edema. Further work up negative for PE per CT chest.   Patient has responded well to medical therapy including diuresis with furosemide, with decreased oxygen requirements.  Troponin I trended down, no chest pain or acute ischemic changes in EKG. Patient completed 48 H of full anticoagulation with heparin drip. rec continue medical therapy with asa, B blockade and statin, no ace inh due hyperkalemia and risk of hypotension.Will  need outpatient follow up echocardiogram in 3 months.   1.  Acute hypoxic respiratory failure due to SARS COVID-19 viral pneumonia.  Patient was admitted to the medical ward, she received supplemental oxygen per nasal cannula, she was treated with remdesivir, systemic corticosteroids and Tocilizumab.  She received antitussive agents, bronchodilators and airway clearing techniques with incentive spirometer and flutter valve.  Patient responded well to medical therapy with improvement of her symptoms and inflammatory markers.  Her oximetry at discharge was 91% on room air.   2.  Non-ST elevation myocardial infarction, type II MI, complicated with cardiogenic pulmonary edema, acute.  Patient had no frank chest pain, her troponins were elevated, peaked at 1,961, further work-up with echocardiography showed a preserved LV systolic function, positive apical hypokinesis preserved RV function.  Case was consulted with cardiology, recommendations to continue anticoagulation with heparin for 48 hours.  Patient received also beta-blockade, aspirin and statin with good toleration.  Her LDL was 61.     3.  COPD with acute exacerbation.  Patient received bronchodilators along with systemic steroids, with improvement of her symptoms.  4.  Dyslipidemia.  Lipid profile with cholesterol 118, HDL 20, LDL 61, triglycerides 184.  Continue atorvastatin.  5.  Hyperkalemia.  Patient developed hyperkalemia, peaked at 5.5, her kidney function remained preserved, patient received medical therapy with sodium zirconium, along with insulin.  Follow-up potassium at discharge 4.1.   Discharge Diagnoses:  Principal Problem:   Pneumonia due to COVID-19 virus   Chronic obstructive pulmonary disease, unspecified ([place])   Acute respiratory failure with hypoxia (HCC)   NSTEMI (non-ST elevated myocardial infarction) ([place])   Dyslipidemia  History of Present Illness  09/06/2019  Pulmonary/ 1st office eval/Jessica Lewis    Chief Complaint  Patient presents with  . Consult    Pneumonia due to COVID-19 virus   Dyspnea:  Housework, mailbox 100 ft flat / no problem [employer] including across parking lot = MMR[employer]2 = can't walk a nl pace on a flat grade s sob but does fine slow   Cough: none / some hoarseness with throat clearing  Sleep: on side / one pillows SABA use: none  02 :  Not using  rec Make sure you check your oxygen saturations at highest level of activity to be sure it stays over 90%   Work on inhaler technique   12/16/2019  f/u ov/Jessica Lewis re:  GOLD III s/p covid 19 trelegy Chief Complaint  Patient presents with  . Follow-up    Breathing is much improved. She has not had to use her albuterol inhaler.    Dyspnea:  Step machine x 20 min / housework/ mab ok  Cough: no  Sleeping: one pillow on side  SABA RDE:YCXK  02: none    No obvious day to day or daytime variability or assoc excess/ purulent sputum or mucus plugs or hemoptysis or cp or chest tightness, subjective wheeze or overt sinus or hb symptoms.   Sleeping  without nocturnal  or early am exacerbation  of respiratory  c/o's or need for noct saba. Also denies any obvious fluctuation of symptoms with weather or environmental changes or other aggravating or alleviating factors except as outlined above   No unusual exposure hx or h/o childhood pna/ asthma or knowledge of premature birth.  Current Allergies, Complete Past Medical History, Past Surgical History, Family History, and Social History were reviewed in [employer] record.  ROS  The following are not active complaints unless bolded Hoarseness, sore throat, dysphagia, dental problems, itching, sneezing,  nasal congestion or discharge of excess mucus or purulent secretions, ear ache,   fever, chills, sweats, unintended wt loss or wt gain, classically pleuritic or exertional cp,  orthopnea pnd or arm/hand swelling  or leg swelling, presyncope, palpitations, abdominal  pain, anorexia, nausea, vomiting, diarrhea  or change in bowel habits or change in bladder habits, change in stools or change in urine, dysuria, hematuria,  rash, arthralgias, visual complaints, headache, numbness, weakness or ataxia or problems with walking or coordination,  change in mood or  memory.        Current Meds  Medication Sig  . albuterol (VENTOLIN HFA) 108 (90 Base) MCG/ACT inhaler Inhale 2 puffs into the lungs every 6 (six) hours as needed for wheezing or shortness of breath.  . Ascorbic Acid (VITAMIN C) 500 MG CHEW Chew 1 tablet by mouth daily.  Marland Kitchen aspirin EC 81 MG tablet Take 81 mg by mouth daily.  Marland Kitchen atorvastatin (LIPITOR) 20 MG tablet Take 0.5 tablets (10 mg total) by mouth daily at 6 PM.  . calcium carbonate (OS-CAL - DOSED IN MG OF ELEMENTAL CALCIUM) 1250 (500 Ca) MG tablet Take 1,000 mg by mouth daily.   . Fluticasone-Umeclidin-Vilant (TRELEGY ELLIPTA) 100-62.5-25 MCG/INH AEPB Inhale 1 puff into the lungs daily.  Marland Kitchen losartan (COZAAR) 25 MG tablet Take 1 tablet (25 mg total) by mouth daily.  . metoprolol tartrate (LOPRESSOR) 25 MG tablet TAKE 1/2 TABLET BY MOUTH TWICE DAILY.  . Multiple Vitamins-Minerals (ONE-A-DAY WOMENS PETITES PO) Take by mouth daily.  . Vitamin D, Ergocalciferol, (DRISDOL) 1.25 MG (50000 UNIT) CAPS capsule Take 50,000 Units by mouth every  Tuesday.   . zinc gluconate 50 MG tablet Take 50 mg by mouth daily.                  Past Medical History:  Diagnosis Date  . Allergic rhinitis due to pollen   . Bronchitis   . Chronic obstructive pulmonary disease, unspecified ([place])   . Cystocele 10/14/2015  . Headache(784.0)    Menstral migraines  . Osteoporosis   . Pneumonia due to COVID-19 virus   . Rectocele 11/16/2012  . Rosacea          Objective:     Wt Readings from Last 3 Encounters:  12/16/19 222 lb (100.7 kg)  10/28/19 220 lb (99.8 kg)  10/22/19 221 lb (100.2 kg)     Vital signs reviewed - Note on arrival 02 sats  97% on RA      HEENT  : pt wearing mask not removed for exam due to covid - 19 concerns.    NECK :  without JVD/Nodes/TM/ nl carotid upstrokes bilaterally   LUNGS: no acc muscle use,  Mild barrel  contour chest wall with bilateral  Distant bs s audible wheeze and  without cough on insp or exp maneuvers  and mild  Hyperresonant  to  percussion bilaterally     CV:  RRR  no s3 or murmur or increase in P2, and no edema   ABD:  Obese soft and nontender with pos end  insp Hoover's  in the supine position. No bruits or organomegaly appreciated, bowel sounds nl  MS:   Nl gait/  ext warm without deformities, calf tenderness, cyanosis or clubbing No obvious joint restrictions   SKIN: warm and dry without lesions    NEURO:  alert, approp, nl sensorium with  no motor or cerebellar deficits apparent.                  Assessment

## 2019-12-16 NOTE — Assessment & Plan Note (Signed)
Quit smoking 1987  - PFT's 12/26/17  FEV1 0.88 (39 % ) ratio 0.63  p 0 % improvement from saba p ? prior to study with DLCO  19.69 (81%) corrects to 6.54 (136%)  for alv volume and FV curve classic curvature   - 09/06/2019   Walked RA x two laps =  approx 52ft @ slow  pace - stopped due to end of study with mild sob sats of 96 % at the end of the study.   Group D in terms of symptom/risk and laba/lama/ICS  therefore appropriate rx at this point >>>  Continue trelegy, refills per PCP or here in a year prn    Advised on reconditioning/ wt loss and f/u prn           Each maintenance medication was reviewed in detail including emphasizing most importantly the difference between maintenance and prns and under what circumstances the prns are to be triggered using an action plan format where appropriate.  Total time for H and P, chart review, counseling, reviewing proper use of elipta device and generating customized AVS unique to this office visit / charting = 20 min

## 2019-12-16 NOTE — Patient Instructions (Signed)
To get the most out of exercise, you need to be continuously aware that you are short of breath, but never out of breath, for 30 minutes daily. As you improve, it will actually be easier for you to do the same amount of exercise  in  30 minutes so always push to the level where you are short of breath.     If you are satisfied with your treatment plan,  let your doctor know and he/she can either refill your medications or you can return here when your prescription runs out.     If in any way you are not 100% satisfied,  please tell us.  If 100% better, tell your friends!  Pulmonary follow up is as needed

## 2020-01-06 DIAGNOSIS — M779 Enthesopathy, unspecified: Secondary | ICD-10-CM | POA: Diagnosis not present

## 2020-01-06 DIAGNOSIS — M79672 Pain in left foot: Secondary | ICD-10-CM | POA: Diagnosis not present

## 2020-01-06 DIAGNOSIS — M778 Other enthesopathies, not elsewhere classified: Secondary | ICD-10-CM | POA: Diagnosis not present

## 2020-01-08 ENCOUNTER — Ambulatory Visit (HOSPITAL_COMMUNITY)
Admission: RE | Admit: 2020-01-08 | Discharge: 2020-01-08 | Disposition: A | Discharge status: Home / Self Care | Payer: Medicare Other | Source: Ambulatory Visit | Attending: Obstetrics and Gynecology | Admitting: Obstetrics and Gynecology

## 2020-01-08 ENCOUNTER — Other Ambulatory Visit: Payer: Self-pay

## 2020-01-08 DIAGNOSIS — Z1231 Encounter for screening mammogram for malignant neoplasm of breast: Secondary | ICD-10-CM | POA: Insufficient documentation

## 2020-01-10 DIAGNOSIS — I10 Essential (primary) hypertension: Secondary | ICD-10-CM | POA: Diagnosis not present

## 2020-01-10 DIAGNOSIS — R7301 Impaired fasting glucose: Secondary | ICD-10-CM | POA: Diagnosis not present

## 2020-01-10 DIAGNOSIS — E559 Vitamin D deficiency, unspecified: Secondary | ICD-10-CM | POA: Diagnosis not present

## 2020-01-10 DIAGNOSIS — Z0000 Encounter for general adult medical examination without abnormal findings: Secondary | ICD-10-CM | POA: Diagnosis not present

## 2020-01-10 DIAGNOSIS — Z0189 Encounter for other specified special examinations: Secondary | ICD-10-CM | POA: Diagnosis not present

## 2020-01-15 DIAGNOSIS — J449 Chronic obstructive pulmonary disease, unspecified: Secondary | ICD-10-CM | POA: Diagnosis not present

## 2020-01-15 DIAGNOSIS — R7301 Impaired fasting glucose: Secondary | ICD-10-CM | POA: Diagnosis not present

## 2020-01-15 DIAGNOSIS — M25542 Pain in joints of left hand: Secondary | ICD-10-CM | POA: Diagnosis not present

## 2020-01-15 DIAGNOSIS — E782 Mixed hyperlipidemia: Secondary | ICD-10-CM | POA: Diagnosis not present

## 2020-01-15 DIAGNOSIS — I10 Essential (primary) hypertension: Secondary | ICD-10-CM | POA: Diagnosis not present

## 2020-01-30 DIAGNOSIS — H524 Presbyopia: Secondary | ICD-10-CM | POA: Diagnosis not present

## 2020-01-30 DIAGNOSIS — H40013 Open angle with borderline findings, low risk, bilateral: Secondary | ICD-10-CM | POA: Diagnosis not present

## 2020-02-28 DIAGNOSIS — R0981 Nasal congestion: Secondary | ICD-10-CM | POA: Diagnosis not present

## 2020-02-28 DIAGNOSIS — R070 Pain in throat: Secondary | ICD-10-CM | POA: Diagnosis not present

## 2020-02-28 DIAGNOSIS — R05 Cough: Secondary | ICD-10-CM | POA: Diagnosis not present

## 2020-02-28 DIAGNOSIS — Z1152 Encounter for screening for COVID-19: Secondary | ICD-10-CM | POA: Diagnosis not present

## 2020-03-02 DIAGNOSIS — I739 Peripheral vascular disease, unspecified: Secondary | ICD-10-CM | POA: Diagnosis not present

## 2020-03-02 DIAGNOSIS — M79672 Pain in left foot: Secondary | ICD-10-CM | POA: Diagnosis not present

## 2020-03-02 DIAGNOSIS — L110 Acquired keratosis follicularis: Secondary | ICD-10-CM | POA: Diagnosis not present

## 2020-03-02 DIAGNOSIS — M79671 Pain in right foot: Secondary | ICD-10-CM | POA: Diagnosis not present

## 2020-03-02 DIAGNOSIS — M79674 Pain in right toe(s): Secondary | ICD-10-CM | POA: Diagnosis not present

## 2020-04-03 ENCOUNTER — Encounter: Payer: Self-pay | Admitting: Cardiology

## 2020-04-03 ENCOUNTER — Other Ambulatory Visit: Payer: Self-pay

## 2020-04-03 ENCOUNTER — Ambulatory Visit (INDEPENDENT_AMBULATORY_CARE_PROVIDER_SITE_OTHER): Payer: Medicare Other | Admitting: Cardiology

## 2020-04-03 VITALS — BP 150/82 | HR 82 | Ht 64.0 in | Wt 229.6 lb

## 2020-04-03 DIAGNOSIS — I429 Cardiomyopathy, unspecified: Secondary | ICD-10-CM | POA: Diagnosis not present

## 2020-04-03 DIAGNOSIS — I700 Atherosclerosis of aorta: Secondary | ICD-10-CM | POA: Diagnosis not present

## 2020-04-03 NOTE — Patient Instructions (Signed)

## 2020-04-03 NOTE — Progress Notes (Signed)
Cardiology Office Note  Date: 04/03/2020   ID: Jessica Lewis [birth year], MRN [id number]  PCP:  Celene Squibb, MD  Cardiologist:  Rozann Lesches, MD Electrophysiologist:  None   Chief Complaint  Patient presents with  . Cardiac follow-up    History of Present Illness: Jessica Lewis is a 74 y.o. female last seen in March.  She presents with her husband for a follow-up visit.  States that she has been doing well, no unusual shortness of breath with typical ADLs, no chest pain or palpitations.  Review of home blood pressure measurements finds systolics ranging from 789-381.  I reviewed her medications which are outlined below.  She reports no intolerances.  Echocardiogram from March is noted below.  Past Medical History:  Diagnosis Date  . Allergic rhinitis due to pollen   . Bronchitis   . Chronic obstructive pulmonary disease, unspecified ([place])   . Cystocele 10/14/2015  . Headache(784.0)    Menstral migraines  . Osteoporosis   . Pneumonia due to COVID-19 virus   . Rectocele 11/16/2012  . Rosacea     Past Surgical History:  Procedure Laterality Date  . CHOLECYSTECTOMY    . COLONOSCOPY  02/09/2011   Procedure: COLONOSCOPY;  Surgeon: Rogene Houston, MD;  [facility];  Service: Endoscopy;  Laterality: N/A;  . TUBAL LIGATION Bilateral     Current Outpatient Medications  Medication Sig Dispense Refill  . albuterol (VENTOLIN HFA) 108 (90 Base) MCG/ACT inhaler Inhale 2 puffs into the lungs every 6 (six) hours as needed for wheezing or shortness of breath.    . Ascorbic Acid (VITAMIN C) 500 MG CHEW Chew 1 tablet by mouth daily.    Marland Kitchen aspirin EC 81 MG tablet Take 81 mg by mouth daily.    Marland Kitchen atorvastatin (LIPITOR) 20 MG tablet Take 0.5 tablets (10 mg total) by mouth daily at 6 PM. 45 tablet 3  . calcium carbonate (OS-CAL - DOSED IN MG OF ELEMENTAL CALCIUM) 1250 (500 Ca) MG tablet Take 1,000 mg by mouth daily.     . Cholecalciferol (VITAMIN D3) 50 MCG (2000 UT) TABS  Take 2,000 Units by mouth daily.    . Fluticasone-Umeclidin-Vilant (TRELEGY ELLIPTA) 100-62.5-25 MCG/INH AEPB Inhale 1 puff into the lungs daily.    Marland Kitchen losartan (COZAAR) 25 MG tablet Take 1 tablet (25 mg total) by mouth daily. 90 tablet 3  . metoprolol tartrate (LOPRESSOR) 25 MG tablet TAKE 1/2 TABLET BY MOUTH TWICE DAILY. 90 tablet 0  . Multiple Vitamins-Minerals (ONE-A-DAY WOMENS PETITES PO) Take by mouth daily.    Marland Kitchen zinc gluconate 50 MG tablet Take 50 mg by mouth daily.     No current facility-administered medications for this visit.   Allergies:  Codeine, Tetracyclines & related, and Claritin [loratadine]   ROS: No palpitations or syncope.  Physical Exam: VS:  BP (!) 150/82   Pulse 82   Ht 5\' 4"  (1.626 m)   Wt 229 lb 9.6 oz (104.1 kg)   SpO2 93%   BMI 39.41 kg/m , BMI Body mass index is 39.41 kg/m.  Wt Readings from Last 3 Encounters:  04/03/20 229 lb 9.6 oz (104.1 kg)  12/16/19 222 lb (100.7 kg)  10/28/19 220 lb (99.8 kg)    General: Patient appears comfortable at rest. HEENT: Conjunctiva and lids normal, wearing a mask. Neck: Supple, no elevated JVP or carotid bruits, no thyromegaly. Lungs: Clear to auscultation, nonlabored breathing at rest. Cardiac: Regular rate and rhythm, no S3,  soft systolic murmur. Extremities: No pitting edema, distal pulses 2+.  ECG:  An ECG dated 07/18/2019 was personally reviewed today and demonstrated:  Sinus rhythm with PAC, nonspecific T wave changes, decreased R wave progression.  Recent Labwork: 07/18/2019: Magnesium 2.1 08/16/2019: ALT 43; AST 33; BUN 18; Creatinine, Ser 0.72; Hemoglobin 15.1; Platelets 293; Potassium 4.7; Sodium 142     Component Value Date/Time   CHOL 118 07/22/2019 1415   TRIG 184 (H) 07/22/2019 1415   HDL 20 (L) 07/22/2019 1415   CHOLHDL 5.9 07/22/2019 1415   VLDL 37 07/22/2019 1415   LDLCALC 61 07/22/2019 1415    Other Studies Reviewed Today:  Echocardiogram 09/19/2019: 1. Left ventricular ejection fraction,  by estimation, is 55 to 60%. The  left ventricle has normal function. The left ventricle has no regional  wall motion abnormalities. There is mild concentric left ventricular  hypertrophy. Left ventricular diastolic  parameters are consistent with Grade I diastolic dysfunction (impaired  relaxation).  2. Right ventricular systolic function is normal. The right ventricular  size is normal.  3. Left atrial size was mildly dilated.  4. The mitral valve is grossly normal. Mild mitral valve regurgitation.  5. The aortic valve is tricuspid. Aortic valve regurgitation is not  visualized. Mild aortic valve sclerosis is present, with no evidence of  aortic valve stenosis.  6. The inferior vena cava is normal in size with greater than 50%  respiratory variability, suggesting right atrial pressure of 3 mmHg.   Assessment and Plan:  1.  History of suspected myocarditis in the setting of COVID-19 pneumonia.  LVEF normalized thereafter, 55 to 60% by echocardiogram in March.  Plan to continue medical therapy including Lopressor and losartan.  2.  Aortic atherosclerosis, asymptomatic and noted by CT imaging.  Continue statin therapy.  Medication Adjustments/Labs and Tests Ordered: Current medicines are reviewed at length with the patient today.  Concerns regarding medicines are outlined above.   Tests Ordered: No orders of the defined types were placed in this encounter.   Medication Changes: No orders of the defined types were placed in this encounter.   Disposition:  Follow up 6 months in the [place] office.  Signed, Satira Sark, MD, [facility] 04/03/2020 2:01 PM    [place] at [facility] [address] [place], [place] [address] Phone: [phone]; Fax: [phone]

## 2020-04-24 DIAGNOSIS — Z23 Encounter for immunization: Secondary | ICD-10-CM | POA: Diagnosis not present

## 2020-05-11 DIAGNOSIS — M779 Enthesopathy, unspecified: Secondary | ICD-10-CM | POA: Diagnosis not present

## 2020-05-11 DIAGNOSIS — S93331D Other subluxation of right foot, subsequent encounter: Secondary | ICD-10-CM | POA: Diagnosis not present

## 2020-05-11 DIAGNOSIS — M79672 Pain in left foot: Secondary | ICD-10-CM | POA: Diagnosis not present

## 2020-05-11 DIAGNOSIS — M79671 Pain in right foot: Secondary | ICD-10-CM | POA: Diagnosis not present

## 2020-05-11 DIAGNOSIS — S93332D Other subluxation of left foot, subsequent encounter: Secondary | ICD-10-CM | POA: Diagnosis not present

## 2020-05-14 DIAGNOSIS — Z23 Encounter for immunization: Secondary | ICD-10-CM | POA: Diagnosis not present

## 2020-05-14 DIAGNOSIS — I10 Essential (primary) hypertension: Secondary | ICD-10-CM | POA: Diagnosis not present

## 2020-05-14 DIAGNOSIS — Z712 Person consulting for explanation of examination or test findings: Secondary | ICD-10-CM | POA: Diagnosis not present

## 2020-05-14 DIAGNOSIS — Z0189 Encounter for other specified special examinations: Secondary | ICD-10-CM | POA: Diagnosis not present

## 2020-05-14 DIAGNOSIS — E1165 Type 2 diabetes mellitus with hyperglycemia: Secondary | ICD-10-CM | POA: Diagnosis not present

## 2020-05-14 DIAGNOSIS — E559 Vitamin D deficiency, unspecified: Secondary | ICD-10-CM | POA: Diagnosis not present

## 2020-05-20 DIAGNOSIS — I10 Essential (primary) hypertension: Secondary | ICD-10-CM | POA: Diagnosis not present

## 2020-05-20 DIAGNOSIS — E782 Mixed hyperlipidemia: Secondary | ICD-10-CM | POA: Diagnosis not present

## 2020-05-20 DIAGNOSIS — M25542 Pain in joints of left hand: Secondary | ICD-10-CM | POA: Diagnosis not present

## 2020-05-20 DIAGNOSIS — R7301 Impaired fasting glucose: Secondary | ICD-10-CM | POA: Diagnosis not present

## 2020-05-20 DIAGNOSIS — J449 Chronic obstructive pulmonary disease, unspecified: Secondary | ICD-10-CM | POA: Diagnosis not present

## 2020-07-01 DIAGNOSIS — Z712 Person consulting for explanation of examination or test findings: Secondary | ICD-10-CM | POA: Diagnosis not present

## 2020-07-01 DIAGNOSIS — Z23 Encounter for immunization: Secondary | ICD-10-CM | POA: Diagnosis not present

## 2020-07-01 DIAGNOSIS — J069 Acute upper respiratory infection, unspecified: Secondary | ICD-10-CM | POA: Diagnosis not present

## 2020-07-01 DIAGNOSIS — Z0189 Encounter for other specified special examinations: Secondary | ICD-10-CM | POA: Diagnosis not present

## 2020-08-03 DIAGNOSIS — H25813 Combined forms of age-related cataract, bilateral: Secondary | ICD-10-CM | POA: Diagnosis not present

## 2020-08-11 DIAGNOSIS — M79672 Pain in left foot: Secondary | ICD-10-CM | POA: Diagnosis not present

## 2020-08-11 DIAGNOSIS — M2042 Other hammer toe(s) (acquired), left foot: Secondary | ICD-10-CM | POA: Diagnosis not present

## 2020-08-11 DIAGNOSIS — M7662 Achilles tendinitis, left leg: Secondary | ICD-10-CM | POA: Diagnosis not present

## 2020-08-11 DIAGNOSIS — M778 Other enthesopathies, not elsewhere classified: Secondary | ICD-10-CM | POA: Diagnosis not present

## 2020-08-11 DIAGNOSIS — M79675 Pain in left toe(s): Secondary | ICD-10-CM | POA: Diagnosis not present

## 2020-09-18 DIAGNOSIS — I10 Essential (primary) hypertension: Secondary | ICD-10-CM | POA: Diagnosis not present

## 2020-09-18 DIAGNOSIS — Z712 Person consulting for explanation of examination or test findings: Secondary | ICD-10-CM | POA: Diagnosis not present

## 2020-09-18 DIAGNOSIS — Z0189 Encounter for other specified special examinations: Secondary | ICD-10-CM | POA: Diagnosis not present

## 2020-09-18 DIAGNOSIS — Z23 Encounter for immunization: Secondary | ICD-10-CM | POA: Diagnosis not present

## 2020-09-23 DIAGNOSIS — E782 Mixed hyperlipidemia: Secondary | ICD-10-CM | POA: Diagnosis not present

## 2020-09-23 DIAGNOSIS — J449 Chronic obstructive pulmonary disease, unspecified: Secondary | ICD-10-CM | POA: Diagnosis not present

## 2020-09-23 DIAGNOSIS — M8580 Other specified disorders of bone density and structure, unspecified site: Secondary | ICD-10-CM | POA: Diagnosis not present

## 2020-09-23 DIAGNOSIS — I10 Essential (primary) hypertension: Secondary | ICD-10-CM | POA: Diagnosis not present

## 2020-09-23 DIAGNOSIS — Z712 Person consulting for explanation of examination or test findings: Secondary | ICD-10-CM | POA: Diagnosis not present

## 2020-09-24 ENCOUNTER — Encounter (INDEPENDENT_AMBULATORY_CARE_PROVIDER_SITE_OTHER): Payer: Self-pay | Admitting: *Deleted

## 2020-09-24 ENCOUNTER — Other Ambulatory Visit (HOSPITAL_COMMUNITY): Payer: Self-pay | Admitting: Internal Medicine

## 2020-09-24 DIAGNOSIS — Z13820 Encounter for screening for osteoporosis: Secondary | ICD-10-CM

## 2020-10-06 DIAGNOSIS — M778 Other enthesopathies, not elsewhere classified: Secondary | ICD-10-CM | POA: Diagnosis not present

## 2020-10-06 DIAGNOSIS — M79672 Pain in left foot: Secondary | ICD-10-CM | POA: Diagnosis not present

## 2020-10-06 DIAGNOSIS — M79675 Pain in left toe(s): Secondary | ICD-10-CM | POA: Diagnosis not present

## 2020-10-06 DIAGNOSIS — M2042 Other hammer toe(s) (acquired), left foot: Secondary | ICD-10-CM | POA: Diagnosis not present

## 2020-10-06 DIAGNOSIS — M7662 Achilles tendinitis, left leg: Secondary | ICD-10-CM | POA: Diagnosis not present

## 2020-10-08 ENCOUNTER — Other Ambulatory Visit: Payer: Self-pay | Admitting: Cardiology

## 2020-10-12 ENCOUNTER — Other Ambulatory Visit (HOSPITAL_COMMUNITY): Payer: Medicare Other

## 2020-10-12 ENCOUNTER — Encounter (HOSPITAL_COMMUNITY): Payer: Self-pay

## 2020-10-29 ENCOUNTER — Encounter: Payer: Self-pay | Admitting: Cardiology

## 2020-10-29 ENCOUNTER — Other Ambulatory Visit: Payer: Self-pay

## 2020-10-29 ENCOUNTER — Ambulatory Visit: Payer: Medicare Other | Admitting: Cardiology

## 2020-10-29 VITALS — BP 160/84 | HR 82 | Ht 63.0 in | Wt 233.0 lb

## 2020-10-29 DIAGNOSIS — I700 Atherosclerosis of aorta: Secondary | ICD-10-CM | POA: Diagnosis not present

## 2020-10-29 DIAGNOSIS — I10 Essential (primary) hypertension: Secondary | ICD-10-CM

## 2020-10-29 DIAGNOSIS — I429 Cardiomyopathy, unspecified: Secondary | ICD-10-CM

## 2020-10-29 NOTE — Progress Notes (Signed)
Cardiology Office Note  Date: 10/29/2020   ID: Jessica, Lewis 11-12-1945, MRN [id number]  PCP:  Celene Squibb, MD  Cardiologist:  Rozann Lesches, MD Electrophysiologist:  None   Chief Complaint  Patient presents with  . Cardiac follow-up    History of Present Illness: Jessica Lewis is a 75 y.o. female last seen in October 2021.  She is here with her husband for a follow-up visit.  She states that she has been doing well.  Goes to the gym at least twice a week, uses the NuStep machine and treadmill.  Also enjoys getting outside in the yard.  No breathlessness beyond NYHA class II, no palpitations.  She has had whitecoat hypertension, we reviewed her home blood pressure checks which look much better with systolics generally in the 120s to 130s.  She reports compliance with her medications as outlined below.  States that she had lab work done with Dr. Nevada Crane in the interim which we are requesting.  I personally reviewed her ECG today which shows normal sinus rhythm.  Past Medical History:  Diagnosis Date  . Allergic rhinitis due to pollen   . Bronchitis   . Chronic obstructive pulmonary disease, unspecified ([place])   . Cystocele 10/14/2015  . Headache(784.0)    Menstral migraines  . Osteoporosis   . Pneumonia due to COVID-19 virus   . Rectocele 11/16/2012  . Rosacea     Past Surgical History:  Procedure Laterality Date  . CHOLECYSTECTOMY    . COLONOSCOPY  02/09/2011   Procedure: COLONOSCOPY;  Surgeon: Rogene Houston, MD;  [facility];  Service: Endoscopy;  Laterality: N/A;  . TUBAL LIGATION Bilateral     Current Outpatient Medications  Medication Sig Dispense Refill  . albuterol (VENTOLIN HFA) 108 (90 Base) MCG/ACT inhaler Inhale 2 puffs into the lungs every 6 (six) hours as needed for wheezing or shortness of breath.    . Ascorbic Acid (VITAMIN C) 500 MG CHEW Chew 1 tablet by mouth daily.    Marland Kitchen aspirin EC 81 MG tablet Take 81 mg by mouth daily.    Marland Kitchen  atorvastatin (LIPITOR) 20 MG tablet Take 0.5 tablets (10 mg total) by mouth daily at 6 PM. 45 tablet 3  . calcium carbonate (OS-CAL - DOSED IN MG OF ELEMENTAL CALCIUM) 1250 (500 Ca) MG tablet Take 1,000 mg by mouth daily.    . Cholecalciferol (VITAMIN D3) 50 MCG (2000 UT) TABS Take 2,000 Units by mouth daily.    . Fluticasone-Umeclidin-Vilant (TRELEGY ELLIPTA) 100-62.5-25 MCG/INH AEPB Inhale 1 puff into the lungs daily.    Marland Kitchen losartan (COZAAR) 25 MG tablet TAKE 1 TABLET BY MOUTH ONCE DAILY. 90 tablet 1  . metoprolol tartrate (LOPRESSOR) 25 MG tablet TAKE 1/2 TABLET BY MOUTH TWICE DAILY. 90 tablet 0  . Multiple Vitamins-Minerals (ONE-A-DAY WOMENS PETITES PO) Take by mouth daily.    Marland Kitchen zinc gluconate 50 MG tablet Take 50 mg by mouth daily.     No current facility-administered medications for this visit.   Allergies:  Codeine, Tetracyclines & related, and Claritin [loratadine]   ROS: No dizziness or syncope.  Physical Exam: VS:  BP (!) 160/84   Pulse 82   Ht 5\' 3"  (1.6 m)   Wt 233 lb (105.7 kg)   SpO2 97%   BMI 41.27 kg/m , BMI Body mass index is 41.27 kg/m.  Wt Readings from Last 3 Encounters:  10/29/20 233 lb (105.7 kg)  04/03/20 229 lb 9.6 oz (  104.1 kg)  12/16/19 222 lb (100.7 kg)    General: Patient appears comfortable at rest. HEENT: Conjunctiva and lids normal, wearing a mask. Neck: Supple, no elevated JVP or carotid bruits, no thyromegaly. Lungs: Clear to auscultation, nonlabored breathing at rest. Cardiac: Regular rate and rhythm, no S3, soft systolic murmur, no pericardial rub. Extremities: No pitting edema, distal pulses 2+.  ECG:  An ECG dated 07/18/2019 was personally reviewed today and demonstrated:  Sinus rhythm with PAC, nonspecific T wave changes, decreased R wave progression.  Recent Labwork: No results found for requested labs within last 8760 hours.     Component Value Date/Time   CHOL 118 07/22/2019 1415   TRIG 184 (H) 07/22/2019 1415   HDL 20 (L) 07/22/2019  1415   CHOLHDL 5.9 07/22/2019 1415   VLDL 37 07/22/2019 1415   LDLCALC 61 07/22/2019 1415    Other Studies Reviewed Today:  Echocardiogram 09/19/2019: 1. Left ventricular ejection fraction, by estimation, is 55 to 60%. The  left ventricle has normal function. The left ventricle has no regional  wall motion abnormalities. There is mild concentric left ventricular  hypertrophy. Left ventricular diastolic  parameters are consistent with Grade I diastolic dysfunction (impaired  relaxation).  2. Right ventricular systolic function is normal. The right ventricular  size is normal.  3. Left atrial size was mildly dilated.  4. The mitral valve is grossly normal. Mild mitral valve regurgitation.  5. The aortic valve is tricuspid. Aortic valve regurgitation is not  visualized. Mild aortic valve sclerosis is present, with no evidence of  aortic valve stenosis.  6. The inferior vena cava is normal in size with greater than 50%  respiratory variability, suggesting right atrial pressure of 3 mmHg.   Assessment and Plan:  1.  History of suspected myocarditis in the setting of COVID-19 pneumonia.  She has done well with normalization of LVEF and reports no new symptomatology.  ECG is normal today.  Continue Lopressor and losartan.  2.  Essential hypertension with whitecoat hypertension.  Home blood pressure recordings reviewed, no clear indication for change in present regimen including losartan and Lopressor.  3.  Aortic atherosclerosis, she continues on Lipitor.  Requesting interval lab work for Dr. Nevada Crane.  Medication Adjustments/Labs and Tests Ordered: Current medicines are reviewed at length with the patient today.  Concerns regarding medicines are outlined above.   Tests Ordered: Orders Placed This Encounter  Procedures  . EKG 12-Lead    Medication Changes: No orders of the defined types were placed in this encounter.   Disposition:  Follow up 6 months.  Signed, Satira Sark, MD, [facility] 10/29/2020 2:26 PM    [facility] at [facility] [address] [place], [place] [address] Phone: [phone]; Fax: [phone]

## 2020-10-29 NOTE — Patient Instructions (Signed)
Medication Instructions:  Your physician recommends that you continue on your current medications as directed. Please refer to the Current Medication list given to you today.  *If you need a refill on your cardiac medications before your next appointment, please call your pharmacy*   Lab Work: NONE   If you have labs (blood work) drawn today and your tests are completely normal, you will receive your results only by: . MyChart Message (if you have MyChart) OR . A paper copy in the mail If you have any lab test that is abnormal or we need to change your treatment, we will call you to review the results.   Testing/Procedures: NONE    Follow-Up: At [facility], you and your health needs are our priority.  As part of our continuing mission to provide you with exceptional heart care, we have created designated Provider Care Teams.  These Care Teams include your primary Cardiologist (physician) and Advanced Practice Providers (APPs -  Physician Assistants and Nurse Practitioners) who all work together to provide you with the care you need, when you need it.  We recommend signing up for the patient portal called "MyChart".  Sign up information is provided on this After Visit Summary.  MyChart is used to connect with patients for Virtual Visits (Telemedicine).  Patients are able to view lab/test results, encounter notes, upcoming appointments, etc.  Non-urgent messages can be sent to your provider as well.   To learn more about what you can do with MyChart, go to [email or web address].    Your next appointment:   6 month(s)  The format for your next appointment:   In Person  Provider:   Samuel McDowell, MD   Other Instructions Thank you for choosing [facility]!    

## 2020-11-02 ENCOUNTER — Other Ambulatory Visit (INDEPENDENT_AMBULATORY_CARE_PROVIDER_SITE_OTHER): Payer: Self-pay

## 2020-11-02 DIAGNOSIS — Z1211 Encounter for screening for malignant neoplasm of colon: Secondary | ICD-10-CM

## 2020-11-04 ENCOUNTER — Telehealth (INDEPENDENT_AMBULATORY_CARE_PROVIDER_SITE_OTHER): Payer: Self-pay

## 2020-11-04 ENCOUNTER — Encounter (INDEPENDENT_AMBULATORY_CARE_PROVIDER_SITE_OTHER): Payer: Self-pay | Admitting: *Deleted

## 2020-11-04 ENCOUNTER — Encounter (INDEPENDENT_AMBULATORY_CARE_PROVIDER_SITE_OTHER): Payer: Self-pay

## 2020-11-04 MED ORDER — NA SULFATE-K SULFATE-MG SULF 17.5-3.13-1.6 GM/177ML PO SOLN: 354.0000 mL | Freq: Once | ORAL | 0 refills | Status: AC

## 2020-11-04 NOTE — Telephone Encounter (Signed)
Referring MD/PCP: Nevada Crane  Procedure: Tcs  Reason/Indication:  Screening  Has patient had this procedure before?  yes  If so, when, by whom and where? 2012    Is there a family history of colon cancer?  no  Who?  What age when diagnosed?    Is patient diabetic? If yes, Type 1 or Type 2   no      Does patient have prosthetic heart valve or mechanical valve?  no  Do you have a pacemaker/defibrillator?  no  Has patient ever had endocarditis/atrial fibrillation? no  Have you had a stroke/heart attack last 6 mths? no  Does patient use oxygen? no  Has patient had joint replacement within last 12 months?  no  Is patient constipated or do they take laxatives? no  Does patient have a history of alcohol/drug use?  no  Is patient on blood thinner such as Coumadin, Plavix and/or Aspirin? yes  Do you take medicine for weight loss?  no  For female patients,: do you still have your menstrual cycle? no  Medications: MVI daily, Calcium daily, asa 81 mg daily, Vit D3 daily, Metoprolo 2.5 mg 1/2 tab bid, Lipitor 20 mg 1/2 tab nightly, Vit C daily, Zinc Daily, Losartan 25 mg daily, Trelegy on puff daily  Allergies: Tetracycline, Claritin, Codeine  Medication Adjustment per Dr Rehman/Dr Jenetta Downer none  Procedure date & time: 12/04/20 8:20

## 2020-11-04 NOTE — Telephone Encounter (Signed)
Ok to schedule.  Thanks,  Alireza Pollack Castaneda Mayorga, MD Gastroenterology and Hepatology [facility]  

## 2020-11-04 NOTE — Telephone Encounter (Signed)
[name], CMA  

## 2020-11-16 DIAGNOSIS — J069 Acute upper respiratory infection, unspecified: Secondary | ICD-10-CM | POA: Diagnosis not present

## 2020-11-16 DIAGNOSIS — R051 Acute cough: Secondary | ICD-10-CM | POA: Diagnosis not present

## 2020-11-16 DIAGNOSIS — R0602 Shortness of breath: Secondary | ICD-10-CM | POA: Diagnosis not present

## 2020-11-16 DIAGNOSIS — R0981 Nasal congestion: Secondary | ICD-10-CM | POA: Diagnosis not present

## 2020-11-25 ENCOUNTER — Encounter (INDEPENDENT_AMBULATORY_CARE_PROVIDER_SITE_OTHER): Payer: Self-pay

## 2020-12-01 DIAGNOSIS — M79674 Pain in right toe(s): Secondary | ICD-10-CM | POA: Diagnosis not present

## 2020-12-01 DIAGNOSIS — M79675 Pain in left toe(s): Secondary | ICD-10-CM | POA: Diagnosis not present

## 2020-12-01 DIAGNOSIS — M79672 Pain in left foot: Secondary | ICD-10-CM | POA: Diagnosis not present

## 2020-12-01 DIAGNOSIS — M79671 Pain in right foot: Secondary | ICD-10-CM | POA: Diagnosis not present

## 2020-12-01 DIAGNOSIS — I739 Peripheral vascular disease, unspecified: Secondary | ICD-10-CM | POA: Diagnosis not present

## 2020-12-01 DIAGNOSIS — L110 Acquired keratosis follicularis: Secondary | ICD-10-CM | POA: Diagnosis not present

## 2020-12-02 ENCOUNTER — Encounter (HOSPITAL_COMMUNITY): Admission: RE | Admit: 2020-12-02 | Payer: Medicare Other | Source: Ambulatory Visit

## 2020-12-02 ENCOUNTER — Other Ambulatory Visit (HOSPITAL_COMMUNITY): Payer: Medicare Other

## 2020-12-07 ENCOUNTER — Other Ambulatory Visit (HOSPITAL_COMMUNITY): Payer: Self-pay | Admitting: Internal Medicine

## 2020-12-07 DIAGNOSIS — Z1231 Encounter for screening mammogram for malignant neoplasm of breast: Secondary | ICD-10-CM

## 2020-12-14 NOTE — Patient Instructions (Signed)
Jessica Lewis  12/14/2020     @PREFPERIOPPHARMACY @   Your procedure is scheduled on  12/18/2020.   Report to [facility] at  [phone]  A.M.   Call this number if you have problems the morning of surgery:  [phone]   Remember:  Follow the diet and prep instructions given to you by the office.    Take these medicines the morning of surgery with A SIP OF WATER    metoprolol.     Please brush your teeth.  Do not wear jewelry, make-up or nail polish.  Do not wear lotions, powders, or perfumes, or deodorant.  Do not shave 48 hours prior to surgery.  Men may shave face and neck.  Do not bring valuables to the hospital.  [facility] is not responsible for any belongings or valuables.  Contacts, dentures or bridgework may not be worn into surgery.  Leave your suitcase in the car.  After surgery it may be brought to your room.  For patients admitted to the hospital, discharge time will be determined by your treatment team.  Patients discharged the day of surgery will not be allowed to drive home and must have someone with them for 24 hours.    Special instructions:  DO NOT smoke tobacco or vape for 24 hours before your procedure.  Please read over the following fact sheets that you were given. Anesthesia Post-op Instructions and Care and Recovery After Surgery      Colonoscopy, Adult, Care After This sheet gives you information about how to care for yourself after your procedure. Your health care provider may also give you more specific instructions. If you have problems or questions, contact your health careprovider. What can I expect after the procedure? After the procedure, it is common to have: A small amount of blood in your stool for 24 hours after the procedure. Some gas. Mild cramping or bloating of your abdomen. Follow these instructions at home: Eating and drinking  Drink enough fluid to keep your urine pale yellow. Follow instructions from your health  care provider about eating or drinking restrictions. Resume your normal diet as instructed by your health care provider. Avoid heavy or fried foods that are hard to digest.  Activity Rest as told by your health care provider. Avoid sitting for a long time without moving. Get up to take short walks every 1-2 hours. This is important to improve blood flow and breathing. Ask for help if you feel weak or unsteady. Return to your normal activities as told by your health care provider. Ask your health care provider what activities are safe for you. Managing cramping and bloating  Try walking around when you have cramps or feel bloated. Apply heat to your abdomen as told by your health care provider. Use the heat source that your health care provider recommends, such as a moist heat pack or a heating pad. Place a towel between your skin and the heat source. Leave the heat on for 20-30 minutes. Remove the heat if your skin turns bright red. This is especially important if you are unable to feel pain, heat, or cold. You may have a greater risk of getting burned.  General instructions If you were given a sedative during the procedure, it can affect you for several hours. Do not drive or operate machinery until your health care provider says that it is safe. For the first 24 hours after the procedure: Do not sign important documents.  Do not drink alcohol. Do your regular daily activities at a slower pace than normal. Eat soft foods that are easy to digest. Take over-the-counter and prescription medicines only as told by your health care provider. Keep all follow-up visits as told by your health care provider. This is important. Contact a health care provider if: You have blood in your stool 2-3 days after the procedure. Get help right away if you have: More than a small spotting of blood in your stool. Large blood clots in your stool. Swelling of your abdomen. Nausea or vomiting. A  fever. Increasing pain in your abdomen that is not relieved with medicine. Summary After the procedure, it is common to have a small amount of blood in your stool. You may also have mild cramping and bloating of your abdomen. If you were given a sedative during the procedure, it can affect you for several hours. Do not drive or operate machinery until your health care provider says that it is safe. Get help right away if you have a lot of blood in your stool, nausea or vomiting, a fever, or increased pain in your abdomen. This information is not intended to replace advice given to you by your health care provider. Make sure you discuss any questions you have with your healthcare provider. Document Revised: 06/14/2019 Document Reviewed: 01/14/2019 Elsevier Patient Education  [place] After This sheet gives you information about how to care for yourself after your procedure. Your health care provider may also give you more specific instructions. If you have problems or questions, contact your health careprovider. What can I expect after the procedure? After the procedure, it is common to have: Tiredness. Forgetfulness about what happened after the procedure. Impaired judgment for important decisions. Nausea or vomiting. Some difficulty with balance. Follow these instructions at home: For the time period you were told by your health care provider:     Rest as needed. Do not participate in activities where you could fall or become injured. Do not drive or use machinery. Do not drink alcohol. Do not take sleeping pills or medicines that cause drowsiness. Do not make important decisions or sign legal documents. Do not take care of children on your own. Eating and drinking Follow the diet that is recommended by your health care provider. Drink enough fluid to keep your urine pale yellow. If you vomit: Drink water, juice, or soup when you can drink  without vomiting. Make sure you have little or no nausea before eating solid foods. General instructions Have a responsible adult stay with you for the time you are told. It is important to have someone help care for you until you are awake and alert. Take over-the-counter and prescription medicines only as told by your health care provider. If you have sleep apnea, surgery and certain medicines can increase your risk for breathing problems. Follow instructions from your health care provider about wearing your sleep device: Anytime you are sleeping, including during daytime naps. While taking prescription pain medicines, sleeping medicines, or medicines that make you drowsy. Avoid smoking. Keep all follow-up visits as told by your health care provider. This is important. Contact a health care provider if: You keep feeling nauseous or you keep vomiting. You feel light-headed. You are still sleepy or having trouble with balance after 24 hours. You develop a rash. You have a fever. You have redness or swelling around the IV site. Get help right away if: You have trouble breathing.  You have new-onset confusion at home. Summary For several hours after your procedure, you may feel tired. You may also be forgetful and have poor judgment. Have a responsible adult stay with you for the time you are told. It is important to have someone help care for you until you are awake and alert. Rest as told. Do not drive or operate machinery. Do not drink alcohol or take sleeping pills. Get help right away if you have trouble breathing, or if you suddenly become confused. This information is not intended to replace advice given to you by your health care provider. Make sure you discuss any questions you have with your healthcare provider. Document Revised: 03/05/2020 Document Reviewed: 05/23/2019 Elsevier Patient Education  2022 [employer].

## 2020-12-16 ENCOUNTER — Other Ambulatory Visit (HOSPITAL_COMMUNITY): Payer: Medicare Other

## 2020-12-16 ENCOUNTER — Encounter (HOSPITAL_COMMUNITY): Payer: Self-pay

## 2020-12-16 ENCOUNTER — Other Ambulatory Visit: Payer: Self-pay

## 2020-12-16 ENCOUNTER — Encounter (HOSPITAL_COMMUNITY)
Admission: RE | Admit: 2020-12-16 | Discharge: 2020-12-16 | Disposition: A | Discharge status: Home / Self Care | Payer: Medicare Other | Source: Ambulatory Visit | Attending: Gastroenterology | Admitting: Gastroenterology

## 2020-12-16 DIAGNOSIS — Z1211 Encounter for screening for malignant neoplasm of colon: Secondary | ICD-10-CM

## 2020-12-16 HISTORY — DX: Other specified postprocedural states: Z98.890

## 2020-12-18 ENCOUNTER — Ambulatory Visit (HOSPITAL_COMMUNITY)
Admission: RE | Admit: 2020-12-18 | Discharge: 2020-12-18 | Disposition: A | Discharge status: Home / Self Care | Payer: Medicare Other | Attending: Gastroenterology | Admitting: Gastroenterology

## 2020-12-18 ENCOUNTER — Ambulatory Visit (HOSPITAL_COMMUNITY): Payer: Medicare Other | Admitting: Certified Registered"

## 2020-12-18 ENCOUNTER — Encounter (HOSPITAL_COMMUNITY): Payer: Self-pay | Admitting: Gastroenterology

## 2020-12-18 ENCOUNTER — Encounter (HOSPITAL_COMMUNITY)
Admission: RE | Disposition: A | Discharge status: Home / Self Care | Payer: Self-pay | Source: Home / Self Care | Attending: Gastroenterology

## 2020-12-18 ENCOUNTER — Other Ambulatory Visit: Payer: Self-pay

## 2020-12-18 DIAGNOSIS — K5730 Diverticulosis of large intestine without perforation or abscess without bleeding: Secondary | ICD-10-CM | POA: Insufficient documentation

## 2020-12-18 DIAGNOSIS — Z7982 Long term (current) use of aspirin: Secondary | ICD-10-CM | POA: Diagnosis not present

## 2020-12-18 DIAGNOSIS — Z881 Allergy status to other antibiotic agents status: Secondary | ICD-10-CM | POA: Diagnosis not present

## 2020-12-18 DIAGNOSIS — Z885 Allergy status to narcotic agent status: Secondary | ICD-10-CM | POA: Diagnosis not present

## 2020-12-18 DIAGNOSIS — M810 Age-related osteoporosis without current pathological fracture: Secondary | ICD-10-CM | POA: Insufficient documentation

## 2020-12-18 DIAGNOSIS — Z888 Allergy status to other drugs, medicaments and biological substances status: Secondary | ICD-10-CM | POA: Insufficient documentation

## 2020-12-18 DIAGNOSIS — K644 Residual hemorrhoidal skin tags: Secondary | ICD-10-CM | POA: Diagnosis not present

## 2020-12-18 DIAGNOSIS — Z7951 Long term (current) use of inhaled steroids: Secondary | ICD-10-CM | POA: Diagnosis not present

## 2020-12-18 DIAGNOSIS — Z1211 Encounter for screening for malignant neoplasm of colon: Secondary | ICD-10-CM

## 2020-12-18 DIAGNOSIS — J449 Chronic obstructive pulmonary disease, unspecified: Secondary | ICD-10-CM | POA: Insufficient documentation

## 2020-12-18 DIAGNOSIS — Z79899 Other long term (current) drug therapy: Secondary | ICD-10-CM | POA: Insufficient documentation

## 2020-12-18 DIAGNOSIS — Z87891 Personal history of nicotine dependence: Secondary | ICD-10-CM | POA: Insufficient documentation

## 2020-12-18 DIAGNOSIS — K635 Polyp of colon: Secondary | ICD-10-CM | POA: Diagnosis not present

## 2020-12-18 DIAGNOSIS — D120 Benign neoplasm of cecum: Secondary | ICD-10-CM | POA: Diagnosis not present

## 2020-12-18 DIAGNOSIS — D123 Benign neoplasm of transverse colon: Secondary | ICD-10-CM | POA: Insufficient documentation

## 2020-12-18 HISTORY — PX: COLONOSCOPY WITH PROPOFOL: SHX5780

## 2020-12-18 HISTORY — PX: POLYPECTOMY: SHX149

## 2020-12-18 LAB — HM COLONOSCOPY

## 2020-12-18 SURGERY — COLONOSCOPY WITH PROPOFOL
Anesthesia: General

## 2020-12-18 MED ORDER — PROPOFOL 10 MG/ML IV BOLUS
INTRAVENOUS | Status: DC | PRN
  Administered 2020-12-18: 150 ug/kg/min via INTRAVENOUS
  Administered 2020-12-18: 100 mg via INTRAVENOUS

## 2020-12-18 MED ORDER — LIDOCAINE HCL (PF) 2 % IJ SOLN
INTRAMUSCULAR | Status: AC
  Filled 2020-12-18: qty 5

## 2020-12-18 MED ORDER — PHENYLEPHRINE 40 MCG/ML (10ML) SYRINGE FOR IV PUSH (FOR BLOOD PRESSURE SUPPORT)
PREFILLED_SYRINGE | INTRAVENOUS | Status: AC
  Filled 2020-12-18: qty 10

## 2020-12-18 MED ORDER — STERILE WATER FOR IRRIGATION IR SOLN
Status: DC | PRN
  Administered 2020-12-18: 200 mL

## 2020-12-18 MED ORDER — PROPOFOL 10 MG/ML IV BOLUS
INTRAVENOUS | Status: AC
  Filled 2020-12-18: qty 240

## 2020-12-18 MED ORDER — EPHEDRINE 5 MG/ML INJ
INTRAVENOUS | Status: AC
  Filled 2020-12-18: qty 10

## 2020-12-18 MED ORDER — GLYCOPYRROLATE PF 0.2 MG/ML IJ SOSY
PREFILLED_SYRINGE | INTRAMUSCULAR | Status: AC
  Filled 2020-12-18: qty 1

## 2020-12-18 MED ORDER — LACTATED RINGERS IV SOLN: INTRAVENOUS | Status: DC

## 2020-12-18 NOTE — Discharge Instructions (Addendum)
You are being discharged to home.  Resume your previous diet.  We are waiting for your pathology results.  Your physician has recommended a repeat colonoscopy in three years for surveillance.  

## 2020-12-18 NOTE — Anesthesia Postprocedure Evaluation (Signed)
Anesthesia Post Note  Patient: Jessica Lewis  Procedure(s) Performed: COLONOSCOPY WITH PROPOFOL POLYPECTOMY INTESTINAL  Patient location during evaluation: Phase II Anesthesia Type: General Level of consciousness: awake Pain management: pain level controlled Vital Signs Assessment: post-procedure vital signs reviewed and stable Respiratory status: spontaneous breathing and respiratory function stable Cardiovascular status: blood pressure returned to baseline and stable Postop Assessment: no headache and no apparent nausea or vomiting Anesthetic complications: no Comments: Late entry   No notable events documented.   Last Vitals:  Vitals:   12/18/20 0634 12/18/20 0806  BP: (!) 145/52 100/74  Pulse: 79 82  Resp: 14 15  Temp: 36.6 C 36.5 C  SpO2: 95% 96%    Last Pain:  Vitals:   12/18/20 0806  TempSrc: Oral  PainSc: 0-No pain                 Louann Sjogren

## 2020-12-18 NOTE — Op Note (Addendum)
[facility] Patient Name: Jessica Lewis Procedure Date: 12/18/2020 7:15 AM MRN: [id number] Date of Birth: [birth year] Attending MD: Maylon Peppers ,  CSN: [id number] Age: 75 Admit Type: Outpatient Procedure:                Colonoscopy Indications:              Screening for colorectal malignant neoplasm Providers:                Maylon Peppers, Cumbola Page, Harmony Risa Grill, Technician Referring MD:              Medicines:                Monitored Anesthesia Care Complications:            No immediate complications. Estimated Blood Loss:     Estimated blood loss: none. Procedure:                Pre-Anesthesia Assessment:                           - Prior to the procedure, a History and Physical                            was performed, and patient medications, allergies                            and sensitivities were reviewed. The patient's                            tolerance of previous anesthesia was reviewed.                           - The risks and benefits of the procedure and the                            sedation options and risks were discussed with the                            patient. All questions were answered and informed                            consent was obtained.                           - ASA Grade Assessment: II - A patient with mild                            systemic disease.                           After obtaining informed consent, the colonoscope                            was passed under direct vision. Throughout the  procedure, the patient's blood pressure, pulse, and                            oxygen saturations were monitored continuously. The                            [id number]-HQ190L ([id number]) scope was introduced through                            the anus and advanced to the the cecum, identified                            by appendiceal orifice and ileocecal valve. The                             colonoscopy was performed without difficulty. The                            patient tolerated the procedure well. The quality                            of the bowel preparation was adequate to identify                            polyps 6 mm and larger in size. Scope In: 7:31:36 AM Scope Out: 7:57:59 AM Scope Withdrawal Time: 0 hours 22 minutes 42 seconds  Total Procedure Duration: 0 hours 26 minutes 23 seconds  Findings:      Skin tags were found on perianal exam.      Three sessile polyps were found in the transverse colon and cecum. The       polyps were 6 to 8 mm in size.      Multiple small and large-mouthed diverticula were found in the sigmoid       colon and descending colon.      The retroflexed view of the distal rectum and anal verge was normal and       showed no anal or rectal abnormalities. Impression:               - Perianal skin tags found on perianal exam.                           - Three 6 to 8 mm polyps in the transverse colon                            and in the cecum.                           - Diverticulosis in the sigmoid colon and in the                            descending colon.                           - The distal rectum and anal verge are normal on  retroflexion view.                           - No specimens collected. Moderate Sedation:      Per Anesthesia Care Recommendation:           - Discharge patient to home (ambulatory).                           - Resume previous diet.                           - Await pathology results.                           - Repeat colonoscopy in 3 years for surveillance. Procedure Code(s):        --- Professional ---                           A8341, Colorectal cancer screening; colonoscopy on                            individual not meeting criteria for high risk Diagnosis Code(s):        --- Professional ---                           Z12.11, Encounter for screening for  malignant                            neoplasm of colon                           K63.5, Polyp of colon                           K64.4, Residual hemorrhoidal skin tags                           K57.30, Diverticulosis of large intestine without                            perforation or abscess without bleeding CPT copyright 2019 American Medical Association. All rights reserved. The codes documented in this report are preliminary and upon coder review may  be revised to meet current compliance requirements. Maylon Peppers, MD Maylon Peppers,  12/18/2020 8:05:04 AM This report has been signed electronically. Number of Addenda: 1 Addendum Number: 1   Addendum Date: 12/24/2020 12:18:43 PM      Thre three mentioned polyps were completely resected and retrieved with       the use of a 10 mm cold snare. Maylon Peppers, MD Maylon Peppers,  12/24/2020 12:19:13 PM This report has been signed electronically.

## 2020-12-18 NOTE — Transfer of Care (Signed)
Immediate Anesthesia Transfer of Care Note  Patient: Jessica Lewis  Procedure(s) Performed: COLONOSCOPY WITH PROPOFOL POLYPECTOMY INTESTINAL  Patient Location: Short Stay  Anesthesia Type:General  Level of Consciousness: awake, alert , oriented and patient cooperative  Airway & Oxygen Therapy: Patient Spontanous Breathing  Post-op Assessment: Report given to RN, Post -op Vital signs reviewed and stable and Patient moving all extremities  Post vital signs: Reviewed and stable  Last Vitals:  Vitals Value Taken Time  BP    Temp    Pulse    Resp    SpO2      Last Pain:  Vitals:   12/18/20 0729  TempSrc:   PainSc: 0-No pain         Complications: No notable events documented.

## 2020-12-18 NOTE — H&P (Signed)
Jessica Lewis is an 75 y.o. female.   Chief Complaint: screening colonoscopy HPI: 75 year old female with past medical history of COPD, osteoporosis, coming for screening colonoscopy.  Last colonoscopy was performed 10 years ago, was normal.  The patient denies having any complaints such as melena, hematochezia, abdominal pain or distention, change in her bowel movement consistency or frequency, no changes in her weight recently.  No family history of colorectal cancer.   Past Medical History:  Diagnosis Date   Allergic rhinitis due to pollen    Bronchitis    Chronic obstructive pulmonary disease, unspecified ([place])    Cystocele 10/14/2015   Headache(784.0)    Menstral migraines   Osteoporosis    Pneumonia due to COVID-19 virus    PONV (postoperative nausea and vomiting)    Rectocele 11/16/2012   Rosacea     Past Surgical History:  Procedure Laterality Date   CHOLECYSTECTOMY     COLONOSCOPY  02/09/2011   Procedure: COLONOSCOPY;  Surgeon: Rogene Houston, MD;  Location: [facility];  Service: Endoscopy;  Laterality: N/A;   TUBAL LIGATION Bilateral     Family History  Problem Relation Age of Onset   Stroke Mother    Social History:  reports that she quit smoking about 35 years ago. Her smoking use included cigarettes. She has a 22.50 pack-year smoking history. She has never used smokeless tobacco. She reports that she does not drink alcohol and does not use drugs.  Allergies:  Allergies  Allergen Reactions   Codeine Nausea And Vomiting   Tetracyclines & Related Rash   Claritin [Loratadine] Hives    Medications Prior to Admission  Medication Sig Dispense Refill   albuterol (VENTOLIN HFA) 108 (90 Base) MCG/ACT inhaler Inhale 2 puffs into the lungs every 6 (six) hours as needed for wheezing or shortness of breath.     Ascorbic Acid (VITAMIN C) 500 MG CHEW Chew 500 mg by mouth daily.     aspirin EC 81 MG tablet Take 81 mg by mouth daily.     atorvastatin (LIPITOR) 20 MG tablet  Take 0.5 tablets (10 mg total) by mouth daily at 6 PM. 45 tablet 3   calcium carbonate (OS-CAL - DOSED IN MG OF ELEMENTAL CALCIUM) 1250 (500 Ca) MG tablet Take 1,000 mg by mouth daily.     Cholecalciferol (VITAMIN D3) 50 MCG (2000 UT) TABS Take 2,000 Units by mouth daily.     Fluticasone-Umeclidin-Vilant (TRELEGY ELLIPTA) 100-62.5-25 MCG/INH AEPB Inhale 1 puff into the lungs daily.     losartan (COZAAR) 25 MG tablet TAKE 1 TABLET BY MOUTH ONCE DAILY. (Patient taking differently: Take 25 mg by mouth at bedtime.) 90 tablet 1   metoprolol tartrate (LOPRESSOR) 25 MG tablet TAKE 1/2 TABLET BY MOUTH TWICE DAILY. (Patient taking differently: Take 12.5 mg by mouth 2 (two) times daily.) 90 tablet 0   Multiple Vitamins-Minerals (ONE-A-DAY WOMENS PETITES PO) Take 1 tablet by mouth daily.     zinc gluconate 50 MG tablet Take 50 mg by mouth daily.      No results found for this or any previous visit (from the past 48 hour(s)). No results found.  Review of Systems  Constitutional: Negative.   HENT: Negative.    Eyes: Negative.   Respiratory: Negative.    Cardiovascular: Negative.   Gastrointestinal: Negative.   Endocrine: Negative.   Genitourinary: Negative.   Musculoskeletal: Negative.   Skin: Negative.   Allergic/Immunologic: Negative.   Neurological: Negative.   Hematological: Negative.   Psychiatric/Behavioral:  Negative.     Blood pressure (!) 145/52, pulse 79, temperature 97.9 F (36.6 C), temperature source Oral, resp. rate 14, SpO2 95 %. Physical Exam  GENERAL: The patient is AO x3, in no acute distress. Obese . HEENT: Head is normocephalic and atraumatic. EOMI are intact. Mouth is well hydrated and without lesions. NECK: Supple. No masses LUNGS: Clear to auscultation. No presence of rhonchi/wheezing/rales. Adequate chest expansion HEART: RRR, normal s1 and s2. ABDOMEN: Soft, nontender, no guarding, no peritoneal signs, and nondistended. BS +. No masses. EXTREMITIES: Without any  cyanosis, clubbing, rash, lesions or edema. NEUROLOGIC: AOx3, no focal motor deficit. SKIN: no jaundice, no rashes  Assessment/Plan 75 year old female with past medical history of COPD, osteoporosis, coming for screening colonoscopy.  The patient is at average risk for colorectal cancer.  We will proceed with colonoscopy today.   Harvel Quale, MD 12/18/2020, 7:27 AM

## 2020-12-18 NOTE — Anesthesia Preprocedure Evaluation (Signed)
Anesthesia Evaluation  Patient identified by MRN, date of birth, ID band Patient awake    Reviewed: Allergy & Precautions, H&P , NPO status , Patient's Chart, lab work & pertinent test results, reviewed documented beta blocker date and time   History of Anesthesia Complications (+) PONV and history of anesthetic complications  Airway Mallampati: II  TM Distance: >3 FB Neck ROM: full    Dental no notable dental hx.    Pulmonary shortness of breath, pneumonia, resolved, COPD, former smoker,    Pulmonary exam normal breath sounds clear to auscultation       Cardiovascular Exercise Tolerance: Good negative cardio ROS   Rhythm:regular Rate:Normal     Neuro/Psych  Headaches, negative psych ROS   GI/Hepatic negative GI ROS, Neg liver ROS,   Endo/Other  negative endocrine ROS  Renal/GU negative Renal ROS  negative genitourinary   Musculoskeletal   Abdominal   Peds  Hematology negative hematology ROS (+)   Anesthesia Other Findings 1. Left ventricular ejection fraction, by estimation, is 55 to 60%. The  left ventricle has normal function. The left ventricle has no regional  wall motion abnormalities. There is mild concentric left ventricular  hypertrophy. Left ventricular diastolic  parameters are consistent with Grade I diastolic dysfunction (impaired  relaxation).  2. Right ventricular systolic function is normal. The right ventricular  size is normal.  3. Left atrial size was mildly dilated.  4. The mitral valve is grossly normal. Mild mitral valve regurgitation.  5. The aortic valve is tricuspid. Aortic valve regurgitation is not  visualized. Mild aortic valve sclerosis is present, with no evidence of  aortic valve stenosis.  6. The inferior vena cava is normal in size with greater than 50%  respiratory variability, suggesting right atrial pressure of 3 mmHg.   Reproductive/Obstetrics negative OB ROS                              Anesthesia Physical Anesthesia Plan  ASA: 3  Anesthesia Plan: General   Post-op Pain Management:    Induction:   PONV Risk Score and Plan: Propofol infusion  Airway Management Planned:   Additional Equipment:   Intra-op Plan:   Post-operative Plan:   Informed Consent: I have reviewed the patients History and Physical, chart, labs and discussed the procedure including the risks, benefits and alternatives for the proposed anesthesia with the patient or authorized representative who has indicated his/her understanding and acceptance.     Dental Advisory Given  Plan Discussed with: CRNA  Anesthesia Plan Comments:         Anesthesia Quick Evaluation

## 2020-12-21 LAB — SURGICAL PATHOLOGY

## 2020-12-24 ENCOUNTER — Encounter (INDEPENDENT_AMBULATORY_CARE_PROVIDER_SITE_OTHER): Payer: Self-pay | Admitting: *Deleted

## 2020-12-25 ENCOUNTER — Encounter (HOSPITAL_COMMUNITY): Payer: Self-pay | Admitting: Gastroenterology

## 2021-01-29 DIAGNOSIS — H35372 Puckering of macula, left eye: Secondary | ICD-10-CM | POA: Diagnosis not present

## 2021-02-01 ENCOUNTER — Ambulatory Visit (HOSPITAL_COMMUNITY)
Admission: RE | Admit: 2021-02-01 | Discharge: 2021-02-01 | Disposition: A | Discharge status: Home / Self Care | Payer: Medicare Other | Source: Ambulatory Visit | Attending: Internal Medicine | Admitting: Internal Medicine

## 2021-02-01 ENCOUNTER — Other Ambulatory Visit: Payer: Self-pay

## 2021-02-01 DIAGNOSIS — Z1231 Encounter for screening mammogram for malignant neoplasm of breast: Secondary | ICD-10-CM

## 2021-02-01 DIAGNOSIS — M810 Age-related osteoporosis without current pathological fracture: Secondary | ICD-10-CM | POA: Insufficient documentation

## 2021-02-01 DIAGNOSIS — Z13820 Encounter for screening for osteoporosis: Secondary | ICD-10-CM | POA: Insufficient documentation

## 2021-02-01 DIAGNOSIS — Z780 Asymptomatic menopausal state: Secondary | ICD-10-CM | POA: Insufficient documentation

## 2021-02-09 DIAGNOSIS — M79672 Pain in left foot: Secondary | ICD-10-CM | POA: Diagnosis not present

## 2021-02-09 DIAGNOSIS — L110 Acquired keratosis follicularis: Secondary | ICD-10-CM | POA: Diagnosis not present

## 2021-02-09 DIAGNOSIS — M79674 Pain in right toe(s): Secondary | ICD-10-CM | POA: Diagnosis not present

## 2021-02-09 DIAGNOSIS — I739 Peripheral vascular disease, unspecified: Secondary | ICD-10-CM | POA: Diagnosis not present

## 2021-02-09 DIAGNOSIS — M79675 Pain in left toe(s): Secondary | ICD-10-CM | POA: Diagnosis not present

## 2021-02-09 DIAGNOSIS — M79671 Pain in right foot: Secondary | ICD-10-CM | POA: Diagnosis not present

## 2021-03-30 DIAGNOSIS — I10 Essential (primary) hypertension: Secondary | ICD-10-CM | POA: Diagnosis not present

## 2021-03-30 DIAGNOSIS — E785 Hyperlipidemia, unspecified: Secondary | ICD-10-CM | POA: Diagnosis not present

## 2021-03-30 DIAGNOSIS — Z0000 Encounter for general adult medical examination without abnormal findings: Secondary | ICD-10-CM | POA: Diagnosis not present

## 2021-03-30 DIAGNOSIS — E119 Type 2 diabetes mellitus without complications: Secondary | ICD-10-CM | POA: Diagnosis not present

## 2021-04-01 DIAGNOSIS — Z0001 Encounter for general adult medical examination with abnormal findings: Secondary | ICD-10-CM | POA: Diagnosis not present

## 2021-04-01 DIAGNOSIS — E1169 Type 2 diabetes mellitus with other specified complication: Secondary | ICD-10-CM | POA: Diagnosis not present

## 2021-04-01 DIAGNOSIS — Z23 Encounter for immunization: Secondary | ICD-10-CM | POA: Diagnosis not present

## 2021-04-01 DIAGNOSIS — R945 Abnormal results of liver function studies: Secondary | ICD-10-CM | POA: Diagnosis not present

## 2021-04-01 DIAGNOSIS — R0600 Dyspnea, unspecified: Secondary | ICD-10-CM | POA: Diagnosis not present

## 2021-04-01 DIAGNOSIS — I10 Essential (primary) hypertension: Secondary | ICD-10-CM | POA: Diagnosis not present

## 2021-04-01 DIAGNOSIS — Z8673 Personal history of transient ischemic attack (TIA), and cerebral infarction without residual deficits: Secondary | ICD-10-CM | POA: Diagnosis not present

## 2021-04-01 DIAGNOSIS — E782 Mixed hyperlipidemia: Secondary | ICD-10-CM | POA: Diagnosis not present

## 2021-04-01 DIAGNOSIS — M1990 Unspecified osteoarthritis, unspecified site: Secondary | ICD-10-CM | POA: Diagnosis not present

## 2021-04-13 ENCOUNTER — Other Ambulatory Visit: Payer: Self-pay | Admitting: Cardiology

## 2021-04-19 ENCOUNTER — Ambulatory Visit (INDEPENDENT_AMBULATORY_CARE_PROVIDER_SITE_OTHER): Payer: Medicare Other

## 2021-04-19 ENCOUNTER — Ambulatory Visit
Admission: EM | Admit: 2021-04-19 | Discharge: 2021-04-19 | Disposition: A | Discharge status: Home / Self Care | Payer: Medicare Other | Attending: Student | Admitting: Student

## 2021-04-19 DIAGNOSIS — R0602 Shortness of breath: Secondary | ICD-10-CM

## 2021-04-19 DIAGNOSIS — J209 Acute bronchitis, unspecified: Secondary | ICD-10-CM

## 2021-04-19 DIAGNOSIS — Z8701 Personal history of pneumonia (recurrent): Secondary | ICD-10-CM

## 2021-04-19 DIAGNOSIS — R062 Wheezing: Secondary | ICD-10-CM | POA: Diagnosis not present

## 2021-04-19 DIAGNOSIS — J069 Acute upper respiratory infection, unspecified: Secondary | ICD-10-CM | POA: Diagnosis not present

## 2021-04-19 DIAGNOSIS — J42 Unspecified chronic bronchitis: Secondary | ICD-10-CM

## 2021-04-19 MED ORDER — PREDNISONE 10 MG (21) PO TBPK: ORAL_TABLET | Freq: Every day | ORAL | 0 refills | Status: DC

## 2021-04-19 NOTE — Discharge Instructions (Addendum)
-  Your chest x-ray looks good.  You do not have pneumonia. -Prednisone taper for cough/bronchitis. I recommend taking this in the morning as it could give you energy.  Avoid NSAIDs like ibuprofen and alleve while taking this medication as they can increase your risk of stomach upset and even GI bleeding when in combination with a steroid. You can continue tylenol (acetaminophen) up to 1000mg  3x daily. -Continue inhalers

## 2021-04-19 NOTE — ED Provider Notes (Signed)
[facility]    CSN: [id number] Arrival date & time: 04/19/21  1241      History   Chief Complaint Chief Complaint  Patient presents with   Cough   Nasal Congestion    HPI Jessica Lewis is a 75 y.o. female presenting with cough, congestion for about 5 days, getting worse.  Medical history of COPD, pneumonia, bronchitis.  Describes 5 days of cough productive of yellow and green sputum, getting worse.  Describes chest congestion.  Some shortness of breath with exertion.  Using her Trelegy Ellipta and albuterol inhalers as directed, has not required the albuterol at all during present illness.  Denies fever/chills, weakness, dizziness, chest pain.  HPI  Past Medical History:  Diagnosis Date   Allergic rhinitis due to pollen    Bronchitis    Chronic obstructive pulmonary disease, unspecified ([place])    Cystocele 10/14/2015   Headache(784.0)    Menstral migraines   Osteoporosis    Pneumonia due to COVID-19 virus    PONV (postoperative nausea and vomiting)    Rectocele 11/16/2012   Rosacea     Patient Active Problem List   Diagnosis Date Noted   Vitamin D deficiency 10/28/2019   Screening for colorectal cancer 10/22/2019   Routine cervical smear 10/22/2019   Echocardiogram abnormal 08/03/2019   Hyperkalemia 08/03/2019   Leukocytosis 08/03/2019   NSTEMI (non-ST elevated myocardial infarction) ([place]) 07/20/2019   Dyslipidemia 07/20/2019   Acute respiratory failure with hypoxia ([place]) 07/17/2019   Pneumonia due to COVID-19 virus 07/17/2019   Elevated troponin 07/17/2019   Age-related osteoporosis without current pathological fracture    Allergic rhinitis due to pollen    COPD GOLD III     Obesity, unspecified    Other chest pain    Shortness of breath    Osteoporosis    Cystocele without uterine prolapse 10/17/2016   Cystocele 10/14/2015   Osteopenia 11/16/2012   Rectocele 11/16/2012   Migraines 11/15/2012    Past Surgical History:  Procedure Laterality  Date   CHOLECYSTECTOMY     COLONOSCOPY  02/09/2011   Procedure: COLONOSCOPY;  Surgeon: Rogene Houston, MD;  [facility];  Service: Endoscopy;  Laterality: N/A;   COLONOSCOPY WITH PROPOFOL N/A 12/18/2020   Procedure: COLONOSCOPY WITH PROPOFOL;  Surgeon: Harvel Quale, MD;  Location: [facility];  Service: Gastroenterology;  Laterality: N/A;  8:15   POLYPECTOMY  12/18/2020   Procedure: POLYPECTOMY INTESTINAL;  Surgeon: Harvel Quale, MD;  Location: [facility];  Service: Gastroenterology;;   TUBAL LIGATION Bilateral     OB History     Gravida  1   Para  1   Term  1   Preterm      AB      Living  1      SAB      IAB      Ectopic      Multiple      Live Births  1            Home Medications    Prior to Admission medications   Medication Sig Start Date End Date Taking? Authorizing Provider  predniSONE (STERAPRED UNI-PAK 21 TAB) 10 MG (21) TBPK tablet Take by mouth daily. Take 6 tabs by mouth daily  for 2 days, then 5 tabs for 2 days, then 4 tabs for 2 days, then 3 tabs for 2 days, 2 tabs for 2 days, then 1 tab by mouth daily for 2 days  04/19/21  Yes Hazel Sams, PA-C  albuterol (VENTOLIN HFA) 108 (90 Base) MCG/ACT inhaler Inhale 2 puffs into the lungs every 6 (six) hours as needed for wheezing or shortness of breath.    [provider]  Ascorbic Acid (VITAMIN C) 500 MG CHEW Chew 500 mg by mouth daily.    [provider]  aspirin EC 81 MG tablet Take 81 mg by mouth daily.    [provider]  atorvastatin (LIPITOR) 20 MG tablet Take 0.5 tablets (10 mg total) by mouth daily at 6 PM. 07/30/19 04/03/20  Corum, Rex Kras, MD  calcium carbonate (OS-CAL - DOSED IN MG OF ELEMENTAL CALCIUM) 1250 (500 Ca) MG tablet Take 1,000 mg by mouth daily.    [provider]  Cholecalciferol (VITAMIN D3) 50 MCG (2000 UT) TABS Take 2,000 Units by mouth daily.    [provider]  Fluticasone-Umeclidin-Vilant  (TRELEGY ELLIPTA) 100-62.5-25 MCG/INH AEPB Inhale 1 puff into the lungs daily.    [provider]  losartan (COZAAR) 25 MG tablet TAKE 1 TABLET BY MOUTH ONCE DAILY. 04/13/21   Satira Sark, MD  metoprolol tartrate (LOPRESSOR) 25 MG tablet TAKE 1/2 TABLET BY MOUTH TWICE DAILY. Patient taking differently: Take 12.5 mg by mouth 2 (two) times daily. 11/14/19   Corum, Rex Kras, MD  Multiple Vitamins-Minerals (ONE-A-DAY WOMENS PETITES PO) Take 1 tablet by mouth daily.    [provider]  zinc gluconate 50 MG tablet Take 50 mg by mouth daily.    [provider]    Family History Family History  Problem Relation Age of Onset   Stroke Mother     Social History Social History   Tobacco Use   Smoking status: Former    Packs/day: 1.50    Years: 15.00    Pack years: 22.50    Types: Cigarettes    Quit date: 1987    Years since quitting: 35.8   Smokeless tobacco: Never  Vaping Use   Vaping Use: Never used  Substance Use Topics   Alcohol use: No   Drug use: No     Allergies   Codeine, Tetracyclines & related, and Claritin [loratadine]   Review of Systems Review of Systems  Constitutional:  Negative for chills and fever.  HENT:  Positive for congestion. Negative for ear pain and sore throat.   Eyes:  Negative for pain and visual disturbance.  Respiratory:  Positive for cough. Negative for shortness of breath.   Cardiovascular:  Negative for chest pain and palpitations.  Gastrointestinal:  Negative for abdominal pain and vomiting.  Genitourinary:  Negative for dysuria and hematuria.  Musculoskeletal:  Negative for arthralgias and back pain.  Skin:  Negative for color change and rash.  Neurological:  Negative for seizures and syncope.  All other systems reviewed and are negative.   Physical Exam Triage Vital Signs ED Triage Vitals  Enc Vitals Group     BP 04/19/21 1521 (!) 170/65     Pulse Rate 04/19/21 1521 (!) 111     Resp 04/19/21 1521 18      Temp 04/19/21 1521 99.7 F (37.6 C)     Temp Source 04/19/21 1521 Oral     SpO2 04/19/21 1521 90 %     Weight --      Height --      Head Circumference --      Peak Flow --      Pain Score 04/19/21 1523 0     Pain Loc --  Pain Edu? --      Excl. in [place]? --    No data found.  Updated Vital Signs BP (!) 170/65 (BP Location: Right Arm)   Pulse (!) 111   Temp 99.7 F (37.6 C) (Oral)   Resp 18   SpO2 90% Comment: Pt notes h/o low O2 stats  Visual Acuity Right Eye Distance:   Left Eye Distance:   Bilateral Distance:    Right Eye Near:   Left Eye Near:    Bilateral Near:     Physical Exam Vitals reviewed.  Constitutional:      General: She is not in acute distress.    Appearance: Normal appearance. She is not ill-appearing.  HENT:     Head: Normocephalic and atraumatic.     Right Ear: Tympanic membrane, ear canal and external ear normal. No tenderness. No middle ear effusion. There is no impacted cerumen. Tympanic membrane is not perforated, erythematous, retracted or bulging.     Left Ear: Tympanic membrane, ear canal and external ear normal. No tenderness.  No middle ear effusion. There is no impacted cerumen. Tympanic membrane is not perforated, erythematous, retracted or bulging.     Nose: Nose normal. No congestion.     Mouth/Throat:     Mouth: Mucous membranes are moist.     Pharynx: Uvula midline. No oropharyngeal exudate or posterior oropharyngeal erythema.  Eyes:     Extraocular Movements: Extraocular movements intact.     Pupils: Pupils are equal, round, and reactive to light.  Cardiovascular:     Rate and Rhythm: Normal rate and regular rhythm.     Heart sounds: Normal heart sounds.  Pulmonary:     Effort: Pulmonary effort is normal.     Breath sounds: Wheezing present. No decreased breath sounds, rhonchi or rales.     Comments: Expiratory wheezes throughout  Abdominal:     Palpations: Abdomen is soft.     Tenderness: There is no abdominal tenderness.  There is no guarding or rebound.  Neurological:     General: No focal deficit present.     Mental Status: She is alert and oriented to person, place, and time.  Psychiatric:        Mood and Affect: Mood normal.        Behavior: Behavior normal.        Thought Content: Thought content normal.        Judgment: Judgment normal.     UC Treatments / Results  Labs (all labs ordered are listed, but only abnormal results are displayed) Labs Reviewed - No data to display  EKG   Radiology DG Chest 2 View  Result Date: 04/19/2021 CLINICAL DATA:  Wheezing and shortness of breath following viral upper respiratory infection. EXAM: CHEST - 2 VIEW COMPARISON:  09/06/2019 FINDINGS: Coarse lung markings are chronic. No focal airspace disease or lung consolidation. Heart size is upper limits of normal and stable. Atherosclerotic calcifications at the aortic arch. No pleural effusions. No acute bone abnormality. IMPRESSION: Chronic lung changes without acute findings. Electronically Signed   By: Markus Daft M.D.   On: 04/19/2021 16:22    Procedures Procedures (including critical care time)  Medications Ordered in UC Medications - No data to display  Initial Impression / Assessment and Plan / UC Course  I have reviewed the triage vital signs and the nursing notes.  Pertinent labs & imaging results that were available during my care of the patient were reviewed by me and considered in my  medical decision making (see chart for details).     This patient is a very pleasant 75 y.o. year old female presenting with acute bronchitis.  She does have COPD at baseline, controlled on Trelegy and albuterol inhalers.  Currently tachycardic and borderline febrile, but oxygenating well on room air.  CXR- Chronic lung changes without acute findings.  Prednisone taper sent. Continue inhalers.  ED return precautions discussed. Patient verbalizes understanding and agreement.   Coding Level 4 for acute illness  with systemic symptoms (tachycardia and fevers), and prescription drug management   Final Clinical Impressions(s) / UC Diagnoses   Final diagnoses:  History of bacterial pneumonia  Acute bronchitis, unspecified organism  Chronic bronchitis, unspecified chronic bronchitis type [facility])     Discharge Instructions      -Your chest x-ray looks good.  You do not have pneumonia. -Prednisone taper for cough/bronchitis. I recommend taking this in the morning as it could give you energy.  Avoid NSAIDs like ibuprofen and alleve while taking this medication as they can increase your risk of stomach upset and even GI bleeding when in combination with a steroid. You can continue tylenol (acetaminophen) up to 1000mg  3x daily. -Continue inhalers      ED Prescriptions     Medication Sig Dispense Auth. Provider   predniSONE (STERAPRED UNI-PAK 21 TAB) 10 MG (21) TBPK tablet Take by mouth daily. Take 6 tabs by mouth daily  for 2 days, then 5 tabs for 2 days, then 4 tabs for 2 days, then 3 tabs for 2 days, 2 tabs for 2 days, then 1 tab by mouth daily for 2 days 42 tablet Hazel Sams, PA-C      PDMP not reviewed this encounter.   Hazel Sams, PA-C 04/19/21 1630

## 2021-04-19 NOTE — ED Triage Notes (Signed)
5 day h/o rhinorrhea, productive cough and two days of congestion. Has been taking mucinex and tylenol with some relief.

## 2021-04-24 ENCOUNTER — Encounter (HOSPITAL_COMMUNITY): Payer: Self-pay | Admitting: Emergency Medicine

## 2021-04-24 ENCOUNTER — Other Ambulatory Visit: Payer: Self-pay

## 2021-04-24 ENCOUNTER — Ambulatory Visit (INDEPENDENT_AMBULATORY_CARE_PROVIDER_SITE_OTHER): Payer: Medicare Other

## 2021-04-24 ENCOUNTER — Ambulatory Visit
Admission: EM | Admit: 2021-04-24 | Discharge: 2021-04-24 | Disposition: A | Discharge status: Home / Self Care | Payer: Medicare Other | Attending: Family Medicine | Admitting: Family Medicine

## 2021-04-24 ENCOUNTER — Inpatient Hospital Stay (HOSPITAL_COMMUNITY)
Admission: EM | Admit: 2021-04-24 | Discharge: 2021-04-27 | DRG: 871 | Disposition: A | Discharge status: Home / Self Care | Payer: Medicare Other | Attending: Internal Medicine | Admitting: Internal Medicine

## 2021-04-24 DIAGNOSIS — I252 Old myocardial infarction: Secondary | ICD-10-CM | POA: Diagnosis not present

## 2021-04-24 DIAGNOSIS — Z7982 Long term (current) use of aspirin: Secondary | ICD-10-CM

## 2021-04-24 DIAGNOSIS — J301 Allergic rhinitis due to pollen: Secondary | ICD-10-CM | POA: Diagnosis not present

## 2021-04-24 DIAGNOSIS — J189 Pneumonia, unspecified organism: Secondary | ICD-10-CM | POA: Diagnosis present

## 2021-04-24 DIAGNOSIS — I5033 Acute on chronic diastolic (congestive) heart failure: Secondary | ICD-10-CM | POA: Diagnosis not present

## 2021-04-24 DIAGNOSIS — Z20822 Contact with and (suspected) exposure to covid-19: Secondary | ICD-10-CM | POA: Diagnosis present

## 2021-04-24 DIAGNOSIS — I5031 Acute diastolic (congestive) heart failure: Secondary | ICD-10-CM | POA: Diagnosis not present

## 2021-04-24 DIAGNOSIS — Z8616 Personal history of COVID-19: Secondary | ICD-10-CM | POA: Diagnosis not present

## 2021-04-24 DIAGNOSIS — A419 Sepsis, unspecified organism: Principal | ICD-10-CM

## 2021-04-24 DIAGNOSIS — E785 Hyperlipidemia, unspecified: Secondary | ICD-10-CM | POA: Diagnosis not present

## 2021-04-24 DIAGNOSIS — J9601 Acute respiratory failure with hypoxia: Secondary | ICD-10-CM | POA: Diagnosis not present

## 2021-04-24 DIAGNOSIS — Z9049 Acquired absence of other specified parts of digestive tract: Secondary | ICD-10-CM | POA: Diagnosis not present

## 2021-04-24 DIAGNOSIS — Z8701 Personal history of pneumonia (recurrent): Secondary | ICD-10-CM | POA: Diagnosis not present

## 2021-04-24 DIAGNOSIS — Z7951 Long term (current) use of inhaled steroids: Secondary | ICD-10-CM

## 2021-04-24 DIAGNOSIS — Z87891 Personal history of nicotine dependence: Secondary | ICD-10-CM

## 2021-04-24 DIAGNOSIS — Z885 Allergy status to narcotic agent status: Secondary | ICD-10-CM

## 2021-04-24 DIAGNOSIS — R6520 Severe sepsis without septic shock: Secondary | ICD-10-CM | POA: Diagnosis not present

## 2021-04-24 DIAGNOSIS — J441 Chronic obstructive pulmonary disease with (acute) exacerbation: Secondary | ICD-10-CM

## 2021-04-24 DIAGNOSIS — I2510 Atherosclerotic heart disease of native coronary artery without angina pectoris: Secondary | ICD-10-CM | POA: Diagnosis not present

## 2021-04-24 DIAGNOSIS — Z6841 Body Mass Index (BMI) 40.0 and over, adult: Secondary | ICD-10-CM

## 2021-04-24 DIAGNOSIS — J449 Chronic obstructive pulmonary disease, unspecified: Secondary | ICD-10-CM | POA: Diagnosis not present

## 2021-04-24 DIAGNOSIS — R0902 Hypoxemia: Secondary | ICD-10-CM | POA: Diagnosis not present

## 2021-04-24 DIAGNOSIS — I110 Hypertensive heart disease with heart failure: Secondary | ICD-10-CM | POA: Diagnosis not present

## 2021-04-24 DIAGNOSIS — Z881 Allergy status to other antibiotic agents status: Secondary | ICD-10-CM

## 2021-04-24 DIAGNOSIS — Z888 Allergy status to other drugs, medicaments and biological substances status: Secondary | ICD-10-CM

## 2021-04-24 DIAGNOSIS — M810 Age-related osteoporosis without current pathological fracture: Secondary | ICD-10-CM | POA: Diagnosis not present

## 2021-04-24 DIAGNOSIS — E66813 Obesity, class 3: Secondary | ICD-10-CM

## 2021-04-24 DIAGNOSIS — R0602 Shortness of breath: Secondary | ICD-10-CM | POA: Diagnosis not present

## 2021-04-24 DIAGNOSIS — J440 Chronic obstructive pulmonary disease with acute lower respiratory infection: Secondary | ICD-10-CM | POA: Diagnosis present

## 2021-04-24 DIAGNOSIS — R000 Tachycardia, unspecified: Secondary | ICD-10-CM | POA: Diagnosis not present

## 2021-04-24 DIAGNOSIS — Z79899 Other long term (current) drug therapy: Secondary | ICD-10-CM | POA: Diagnosis not present

## 2021-04-24 LAB — URINALYSIS, ROUTINE W REFLEX MICROSCOPIC
Bilirubin Urine: NEGATIVE
Glucose, UA: NEGATIVE mg/dL
Hgb urine dipstick: NEGATIVE
Ketones, ur: NEGATIVE mg/dL
Leukocytes,Ua: NEGATIVE
Nitrite: NEGATIVE
Protein, ur: NEGATIVE mg/dL
Specific Gravity, Urine: 1.013 (ref 1.005–1.030)
pH: 6 (ref 5.0–8.0)

## 2021-04-24 LAB — BASIC METABOLIC PANEL
Anion gap: 7 (ref 5–15)
BUN: 18 mg/dL (ref 8–23)
CO2: 27 mmol/L (ref 22–32)
Calcium: 8.2 mg/dL — ABNORMAL LOW (ref 8.9–10.3)
Chloride: 100 mmol/L (ref 98–111)
Creatinine, Ser: 0.69 mg/dL (ref 0.44–1.00)
GFR, Estimated: >60 mL/min (ref >60)
Glucose, Bld: 188 mg/dL — ABNORMAL HIGH (ref 70–99)
Potassium: 4.2 mmol/L (ref 3.5–5.1)
Sodium: 134 mmol/L — ABNORMAL LOW (ref 135–145)

## 2021-04-24 LAB — CBC WITH DIFFERENTIAL/PLATELET
Abs Immature Granulocytes: 0.26 10*3/uL — ABNORMAL HIGH (ref 0.00–0.07)
Basophils Absolute: 0.1 10*3/uL (ref 0.0–0.1)
Basophils Relative: 0 %
Eosinophils Absolute: 0.1 10*3/uL (ref 0.0–0.5)
Eosinophils Relative: 0 %
HCT: 39.6 % (ref 36.0–46.0)
Hemoglobin: 13 g/dL (ref 12.0–15.0)
Immature Granulocytes: 1 %
Lymphocytes Relative: 4 %
Lymphs Abs: 1 10*3/uL (ref 0.7–4.0)
MCH: 30 pg (ref 26.0–34.0)
MCHC: 32.8 g/dL (ref 30.0–36.0)
MCV: 91.5 fL (ref 80.0–100.0)
Monocytes Absolute: 0.9 10*3/uL (ref 0.1–1.0)
Monocytes Relative: 4 %
Neutro Abs: 20.3 10*3/uL — ABNORMAL HIGH (ref 1.7–7.7)
Neutrophils Relative %: 91 %
Platelets: 437 10*3/uL — ABNORMAL HIGH (ref 150–400)
RBC: 4.33 MIL/uL (ref 3.87–5.11)
RDW: 15.7 % — ABNORMAL HIGH (ref 11.5–15.5)
WBC: 22.6 10*3/uL — ABNORMAL HIGH (ref 4.0–10.5)
nRBC: 0 % (ref 0.0–0.2)

## 2021-04-24 LAB — PROTIME-INR
INR: 1.1 (ref 0.8–1.2)
Prothrombin Time: 13.8 seconds (ref 11.4–15.2)

## 2021-04-24 LAB — BLOOD GAS, VENOUS
Acid-Base Excess: 5.2 mmol/L — ABNORMAL HIGH (ref 0.0–2.0)
Bicarbonate: 28.8 mmol/L — ABNORMAL HIGH (ref 20.0–28.0)
FIO2: 21
O2 Saturation: 91.5 %
Patient temperature: 37.6
pCO2, Ven: 43 mmHg — ABNORMAL LOW (ref 44.0–60.0)
pH, Ven: 7.447 — ABNORMAL HIGH (ref 7.250–7.430)
pO2, Ven: 60.8 mmHg — ABNORMAL HIGH (ref 32.0–45.0)

## 2021-04-24 LAB — RESP PANEL BY RT-PCR (FLU A&B, COVID) ARPGX2
Influenza A by PCR: NEGATIVE
Influenza B by PCR: NEGATIVE
SARS Coronavirus 2 by RT PCR: NEGATIVE

## 2021-04-24 LAB — LACTIC ACID, PLASMA
Lactic Acid, Venous: 1.3 mmol/L (ref 0.5–1.9)
Lactic Acid, Venous: 1 mmol/L (ref 0.5–1.9)

## 2021-04-24 LAB — APTT: aPTT: 27 seconds (ref 24–36)

## 2021-04-24 LAB — BRAIN NATRIURETIC PEPTIDE: B Natriuretic Peptide: 128 pg/mL — ABNORMAL HIGH (ref 0.0–100.0)

## 2021-04-24 MED ORDER — CEFTRIAXONE SODIUM 2 G IJ SOLR
2.0000 g | INTRAMUSCULAR | Status: DC
  Administered 2021-04-25 – 2021-04-26 (×2): 2 g via INTRAVENOUS
  Filled 2021-04-24 (×2): qty 20

## 2021-04-24 MED ORDER — IPRATROPIUM-ALBUTEROL 0.5-2.5 (3) MG/3ML IN SOLN
3.0000 mL | Freq: Once | RESPIRATORY_TRACT | Status: AC
  Administered 2021-04-24: 3 mL via RESPIRATORY_TRACT
  Filled 2021-04-24 (×2): qty 3

## 2021-04-24 MED ORDER — ONDANSETRON HCL 4 MG PO TABS: 4.0000 mg | ORAL_TABLET | Freq: Four times a day (QID) | ORAL | Status: DC | PRN

## 2021-04-24 MED ORDER — ALBUTEROL SULFATE HFA 108 (90 BASE) MCG/ACT IN AERS
INHALATION_SPRAY | RESPIRATORY_TRACT | Status: AC
  Filled 2021-04-24: qty 6.7

## 2021-04-24 MED ORDER — LACTATED RINGERS IV SOLN: INTRAVENOUS | Status: DC

## 2021-04-24 MED ORDER — ALBUTEROL SULFATE HFA 108 (90 BASE) MCG/ACT IN AERS: 2.0000 | INHALATION_SPRAY | Freq: Once | RESPIRATORY_TRACT | Status: AC

## 2021-04-24 MED ORDER — ONDANSETRON HCL 4 MG/2ML IJ SOLN: 4.0000 mg | Freq: Four times a day (QID) | INTRAMUSCULAR | Status: DC | PRN

## 2021-04-24 MED ORDER — ATORVASTATIN CALCIUM 10 MG PO TABS
10.0000 mg | ORAL_TABLET | Freq: Every day | ORAL | Status: DC
  Administered 2021-04-25 – 2021-04-26 (×2): 10 mg via ORAL
  Filled 2021-04-24 (×2): qty 1

## 2021-04-24 MED ORDER — METHYLPREDNISOLONE SODIUM SUCC 125 MG IJ SOLR
125.0000 mg | Freq: Once | INTRAMUSCULAR | Status: AC
  Administered 2021-04-24: 125 mg via INTRAVENOUS

## 2021-04-24 MED ORDER — UMECLIDINIUM BROMIDE 62.5 MCG/ACT IN AEPB
1.0000 | INHALATION_SPRAY | Freq: Every day | RESPIRATORY_TRACT | Status: DC
  Filled 2021-04-24: qty 7

## 2021-04-24 MED ORDER — ALBUTEROL SULFATE (2.5 MG/3ML) 0.083% IN NEBU: 2.5000 mg | INHALATION_SOLUTION | RESPIRATORY_TRACT | Status: DC | PRN

## 2021-04-24 MED ORDER — METOPROLOL TARTRATE 25 MG PO TABS
12.5000 mg | ORAL_TABLET | Freq: Two times a day (BID) | ORAL | Status: DC
  Administered 2021-04-24 – 2021-04-27 (×6): 12.5 mg via ORAL
  Filled 2021-04-24 (×6): qty 1

## 2021-04-24 MED ORDER — METHYLPREDNISOLONE SODIUM SUCC 125 MG IJ SOLR
60.0000 mg | Freq: Two times a day (BID) | INTRAMUSCULAR | Status: DC
  Administered 2021-04-25: 60 mg via INTRAVENOUS
  Filled 2021-04-24 (×2): qty 2

## 2021-04-24 MED ORDER — IPRATROPIUM-ALBUTEROL 0.5-2.5 (3) MG/3ML IN SOLN
3.0000 mL | Freq: Four times a day (QID) | RESPIRATORY_TRACT | Status: DC
  Administered 2021-04-25 – 2021-04-27 (×9): 3 mL via RESPIRATORY_TRACT
  Filled 2021-04-24 (×8): qty 3

## 2021-04-24 MED ORDER — SODIUM CHLORIDE 0.9 % IV SOLN
500.0000 mg | INTRAVENOUS | Status: DC
  Administered 2021-04-24 – 2021-04-26 (×3): 500 mg via INTRAVENOUS
  Filled 2021-04-24 (×3): qty 500

## 2021-04-24 MED ORDER — PREDNISONE 20 MG PO TABS: 40.0000 mg | ORAL_TABLET | Freq: Every day | ORAL | Status: DC

## 2021-04-24 MED ORDER — LOSARTAN POTASSIUM 50 MG PO TABS
25.0000 mg | ORAL_TABLET | Freq: Every day | ORAL | Status: DC
  Administered 2021-04-25: 25 mg via ORAL
  Filled 2021-04-24 (×2): qty 1

## 2021-04-24 MED ORDER — SODIUM CHLORIDE 0.9 % IV SOLN
2.0000 g | Freq: Once | INTRAVENOUS | Status: DC
  Filled 2021-04-24: qty 20

## 2021-04-24 MED ORDER — ENOXAPARIN SODIUM 60 MG/0.6ML IJ SOSY
50.0000 mg | PREFILLED_SYRINGE | INTRAMUSCULAR | Status: DC
  Administered 2021-04-24 – 2021-04-26 (×3): 50 mg via SUBCUTANEOUS
  Filled 2021-04-24 (×3): qty 0.6

## 2021-04-24 MED ORDER — FLUTICASONE-UMECLIDIN-VILANT 100-62.5-25 MCG/ACT IN AEPB: 1.0000 | INHALATION_SPRAY | Freq: Every day | RESPIRATORY_TRACT | Status: DC

## 2021-04-24 MED ORDER — ASPIRIN EC 81 MG PO TBEC
81.0000 mg | DELAYED_RELEASE_TABLET | Freq: Every day | ORAL | Status: DC
  Administered 2021-04-25 – 2021-04-27 (×3): 81 mg via ORAL
  Filled 2021-04-24 (×3): qty 1

## 2021-04-24 MED ORDER — FLUTICASONE FUROATE-VILANTEROL 100-25 MCG/ACT IN AEPB
1.0000 | INHALATION_SPRAY | Freq: Every day | RESPIRATORY_TRACT | Status: DC
  Filled 2021-04-24: qty 28

## 2021-04-24 NOTE — ED Triage Notes (Signed)
Pt states that she has a cough and is very sob. Pt states that she has a history of COPD.

## 2021-04-24 NOTE — ED Provider Notes (Signed)
Medical screening examination/treatment/procedure(s) were conducted as a shared visit with non-physician practitioner(s) and myself.  I personally evaluated the patient during the encounter.  Clinical Impression:   Final diagnoses:  Acute respiratory failure with hypoxia (HCC)  Sepsis, due to unspecified organism, unspecified whether acute organ dysfunction present [facility])    This patient is a 75 year old female presenting to the hospital with increasing shortness of breath over the last several days.  She has become quite dyspneic this morning and has increasing amounts of coughing.  She on exam has diffuse expiratory wheezing with rales on inspiration and an x-ray that was performed at the urgent care prior to her arrival which showed some changes from the one that was performed earlier in the week.  Initially she was diagnosed with bronchitis and placed on steroids however the symptoms were exacerbated and acutely worsened today.  On exam the patient has bilateral symmetrical pitting edema which is minimal but present, she has rales bilaterally, she speaks in just shortened sentences and has hypoxia to 90 to 92% on room air while she is lying down however she drops into the low to mid 80s when she ambulates.  X-ray shows possible interstitial edema versus bacterial process, no focal infiltrate.  Her leukocytosis is high at 22,000 though she has been on steroids for a few days there is a neutrophilic predominance suggesting bacterial cause.  Metabolic panel is reassuring, BNP is only 128 also suggesting that this is not cardiac related.  Unfortunately the patient probably has sepsis due to severe leukocytosis hypoxia and tachycardia with the presence of likely pulmonary infection.  I do not see any endorgan dysfunction on the labs  Continuous nebulizer therapy Antibiotic therapy Supplemental oxygen for hypoxia Rule out COVID  Blood cultures were added after antibiotics were ordered as the patient  was not thought to be septic until CBC resulted.  .Critical Care Performed by: Noemi Chapel, MD Authorized by: Noemi Chapel, MD   Critical care provider statement:    Critical care time (minutes):  45   Critical care time was exclusive of:  Separately billable procedures and treating other patients   Critical care was necessary to treat or prevent imminent or life-threatening deterioration of the following conditions:  Respiratory failure and sepsis   Critical care was time spent personally by me on the following activities:  Development of treatment plan with patient or surrogate, discussions with consultants, evaluation of patient's response to treatment, examination of patient, obtaining history from patient or surrogate, review of old charts, re-evaluation of patient's condition, pulse oximetry, ordering and review of radiographic studies, ordering and review of laboratory studies and ordering and performing treatments and interventions   Care discussed with: admitting provider       EKG Interpretation  Date/Time:  Saturday April 24 2021 16:25:10 EDT Ventricular Rate:  106 PR Interval:  131 QRS Duration: 98 QT Interval:  339 QTC Calculation: 451 R Axis:   83 Text Interpretation: Sinus tachycardia Probable left atrial enlargement Borderline right axis deviation Confirmed by Noemi Chapel [phone]) on 04/24/2021 5:51:31 PM          Noemi Chapel, MD 04/25/21 1505

## 2021-04-24 NOTE — ED Notes (Signed)
Patient is being discharged from the Urgent Care and sent to the Emergency Department via Personal Vehicle with family . Per Hagler MD, patient is in need of higher level of care due to worsening SOB. Patient is aware and verbalizes understanding of plan of care.  Vitals:   04/24/21 1544  BP: (!) 152/79  Pulse: (!) 111  Temp: 99.1 F (37.3 C)  SpO2: (!) 88%

## 2021-04-24 NOTE — ED Notes (Signed)
Rectal temp is 98.8. Nurse notified.

## 2021-04-24 NOTE — ED Provider Notes (Signed)
Initial [facility] EMERGENCY DEPARTMENT Provider Note   CSN: [id number] Arrival date & time: 04/24/21  1613     History Chief Complaint  Patient presents with   Shortness of Breath    Jessica Lewis is a 75 y.o. female.  Patient with history of COPD presents today with shortness of breath.  She states that she began developing cough and congestion 10 days ago, was seen by urgent care 5 days ago and diagnosed with bronchitis, given prednisone taper for same.  States that symptoms improved initially, with worsening in the past 2 days and more significant shortness of breath today.  Return to urgent care earlier who found that she was satting in the high 80s on room air, placed her on 2 L with improvement and sent her here for higher level of care.  Patient states that she has been unable to ambulate without shortness of breath, and has had cough productive of yellow sputum.  Of note, no history of heart failure, last echo was a year ago that revealed EF of 60 to 65%.  Denies leg pain or swelling.  No fevers, chills, chest pain, nausea, vomiting, diarrhea.  Patient currently satting in high 90s on 2 L nasal cannula.  The history is provided by the patient. No language interpreter was used.  Shortness of Breath Associated symptoms: cough   Associated symptoms: no abdominal pain, no chest pain, no fever, no headaches and no vomiting       Past Medical History:  Diagnosis Date   Allergic rhinitis due to pollen    Bronchitis    Chronic obstructive pulmonary disease, unspecified ([place])    Cystocele 10/14/2015   Headache(784.0)    Menstral migraines   Osteoporosis    Pneumonia due to COVID-19 virus    PONV (postoperative nausea and vomiting)    Rectocele 11/16/2012   Rosacea     Patient Active Problem List   Diagnosis Date Noted   Vitamin D deficiency 10/28/2019   Screening for colorectal cancer 10/22/2019   Routine cervical smear 10/22/2019   Echocardiogram abnormal 08/03/2019    Hyperkalemia 08/03/2019   Leukocytosis 08/03/2019   NSTEMI (non-ST elevated myocardial infarction) ([place]) 07/20/2019   Dyslipidemia 07/20/2019   Acute respiratory failure with hypoxia ([place]) 07/17/2019   Pneumonia due to COVID-19 virus 07/17/2019   Elevated troponin 07/17/2019   Age-related osteoporosis without current pathological fracture    Allergic rhinitis due to pollen    COPD GOLD III     Obesity, unspecified    Other chest pain    Shortness of breath    Osteoporosis    Cystocele without uterine prolapse 10/17/2016   Cystocele 10/14/2015   Osteopenia 11/16/2012   Rectocele 11/16/2012   Migraines 11/15/2012    Past Surgical History:  Procedure Laterality Date   CHOLECYSTECTOMY     COLONOSCOPY  02/09/2011   Procedure: COLONOSCOPY;  Surgeon: Rogene Houston, MD;  Location: [facility];  Service: Endoscopy;  Laterality: N/A;   COLONOSCOPY WITH PROPOFOL N/A 12/18/2020   Procedure: COLONOSCOPY WITH PROPOFOL;  Surgeon: Harvel Quale, MD;  Location: [facility];  Service: Gastroenterology;  Laterality: N/A;  8:15   POLYPECTOMY  12/18/2020   Procedure: POLYPECTOMY INTESTINAL;  Surgeon: Harvel Quale, MD;  Location: [facility];  Service: Gastroenterology;;   TUBAL LIGATION Bilateral      OB History     Gravida  1   Para  1   Term  1   Preterm  AB      Living  1      SAB      IAB      Ectopic      Multiple      Live Births  1           Family History  Problem Relation Age of Onset   Stroke Mother     Social History   Tobacco Use   Smoking status: Former    Packs/day: 1.50    Years: 15.00    Pack years: 22.50    Types: Cigarettes    Quit date: 1987    Years since quitting: 35.8   Smokeless tobacco: Never  Vaping Use   Vaping Use: Never used  Substance Use Topics   Alcohol use: No   Drug use: No    Home Medications Prior to Admission medications   Medication Sig Start Date End Date Taking? Authorizing  Provider  albuterol (VENTOLIN HFA) 108 (90 Base) MCG/ACT inhaler Inhale 2 puffs into the lungs every 6 (six) hours as needed for wheezing or shortness of breath.    [provider]  Ascorbic Acid (VITAMIN C) 500 MG CHEW Chew 500 mg by mouth daily.    [provider]  aspirin EC 81 MG tablet Take 81 mg by mouth daily.    [provider]  atorvastatin (LIPITOR) 20 MG tablet Take 0.5 tablets (10 mg total) by mouth daily at 6 PM. 07/30/19 04/03/20  Corum, Rex Kras, MD  calcium carbonate (OS-CAL - DOSED IN MG OF ELEMENTAL CALCIUM) 1250 (500 Ca) MG tablet Take 1,000 mg by mouth daily.    [provider]  Cholecalciferol (VITAMIN D3) 50 MCG (2000 UT) TABS Take 2,000 Units by mouth daily.    [provider]  Fluticasone-Umeclidin-Vilant (TRELEGY ELLIPTA) 100-62.5-25 MCG/INH AEPB Inhale 1 puff into the lungs daily.    [provider]  losartan (COZAAR) 25 MG tablet TAKE 1 TABLET BY MOUTH ONCE DAILY. 04/13/21   Satira Sark, MD  metoprolol tartrate (LOPRESSOR) 25 MG tablet TAKE 1/2 TABLET BY MOUTH TWICE DAILY. Patient taking differently: Take 12.5 mg by mouth 2 (two) times daily. 11/14/19   Corum, Rex Kras, MD  Multiple Vitamins-Minerals (ONE-A-DAY WOMENS PETITES PO) Take 1 tablet by mouth daily.    [provider]  predniSONE (STERAPRED UNI-PAK 21 TAB) 10 MG (21) TBPK tablet Take by mouth daily. Take 6 tabs by mouth daily  for 2 days, then 5 tabs for 2 days, then 4 tabs for 2 days, then 3 tabs for 2 days, 2 tabs for 2 days, then 1 tab by mouth daily for 2 days 04/19/21   Hazel Sams, PA-C  zinc gluconate 50 MG tablet Take 50 mg by mouth daily.    [provider]    Allergies    Codeine, Tetracyclines & related, and Claritin [loratadine]  Review of Systems   Review of Systems  Constitutional:  Negative for chills, fatigue and fever.  HENT:  Positive for congestion, postnasal drip and rhinorrhea.   Respiratory:  Positive for cough  and shortness of breath. Negative for stridor.   Cardiovascular:  Negative for chest pain, palpitations and leg swelling.  Gastrointestinal:  Negative for abdominal distention, abdominal pain, diarrhea, nausea and vomiting.  Genitourinary:  Negative for dysuria.  Neurological:  Negative for dizziness, tremors, seizures, syncope, facial asymmetry, speech difficulty, weakness, light-headedness, numbness and headaches.  Psychiatric/Behavioral:  Negative for confusion and decreased concentration.   All other  systems reviewed and are negative.  Physical Exam Updated Vital Signs BP (!) 157/61 (BP Location: Right Arm)   Pulse 99   Resp (!) 24   Ht 5\' 2"  (1.575 m)   Wt 106.6 kg   SpO2 96%   BMI 42.98 kg/m   Physical Exam Vitals and nursing note reviewed.  Constitutional:      General: She is not in acute distress.    Appearance: Normal appearance. She is well-developed. She is obese. She is not ill-appearing, toxic-appearing or diaphoretic.  HENT:     Head: Normocephalic and atraumatic.  Cardiovascular:     Rate and Rhythm: Regular rhythm. Tachycardia present.     Pulses: Normal pulses.     Heart sounds: Normal heart sounds.  Pulmonary:     Effort: Tachypnea present. No respiratory distress.     Breath sounds: No stridor. Examination of the right-upper field reveals wheezing. Examination of the left-upper field reveals wheezing. Examination of the right-lower field reveals rales. Examination of the left-lower field reveals rales. Wheezing and rales present.  Abdominal:     General: Bowel sounds are normal.     Palpations: Abdomen is soft.  Musculoskeletal:        General: Normal range of motion.     Cervical back: Normal range of motion.     Right lower leg: No tenderness.     Left lower leg: No tenderness.     Comments: 1+ pitting edema noted to bilateral lower extremities  Skin:    General: Skin is warm and dry.  Neurological:     General: No focal deficit present.     Mental  Status: She is alert.  Psychiatric:        Mood and Affect: Mood normal.        Behavior: Behavior normal.    ED Results / Procedures / Treatments   Labs (all labs ordered are listed, but only abnormal results are displayed) Labs Reviewed  BASIC METABOLIC PANEL - Abnormal; Notable for the following components:      Result Value   Sodium 134 (*)    Glucose, Bld 188 (*)    Calcium 8.2 (*)    All other components within normal limits  BRAIN NATRIURETIC PEPTIDE - Abnormal; Notable for the following components:   B Natriuretic Peptide 128.0 (*)    All other components within normal limits  CBC WITH DIFFERENTIAL/PLATELET - Abnormal; Notable for the following components:   WBC 22.6 (*)    RDW 15.7 (*)    Platelets 437 (*)    Neutro Abs 20.3 (*)    Abs Immature Granulocytes 0.26 (*)    All other components within normal limits  BLOOD GAS, VENOUS - Abnormal; Notable for the following components:   pH, Ven 7.447 (*)    pCO2, Ven 43.0 (*)    pO2, Ven 60.8 (*)    Bicarbonate 28.8 (*)    Acid-Base Excess 5.2 (*)    All other components within normal limits  RESP PANEL BY RT-PCR (FLU A&B, COVID) ARPGX2  CULTURE, BLOOD (ROUTINE X 2)  CULTURE, BLOOD (ROUTINE X 2)  LACTIC ACID, PLASMA  PROTIME-INR  APTT  URINALYSIS, ROUTINE W REFLEX MICROSCOPIC  LACTIC ACID, PLASMA    EKG None  Radiology DG Chest 2 View  Result Date: 04/24/2021 CLINICAL DATA:  SOB EXAM: CHEST - 2 VIEW COMPARISON:  April 19, 2016 FINDINGS: The cardiomediastinal silhouette is unchanged in contour.Atherosclerotic calcifications. No pleural effusion. No pneumothorax. Diffuse reticular prominence with perihilar  vascular congestion and peribronchial cuffing, mildly increased. Visualized abdomen is unremarkable. Multilevel degenerative changes of the thoracic spine. IMPRESSION: Constellation of findings are favored to reflect pulmonary edema. Atypical infection could present similarly. Electronically Signed   By:  Valentino Saxon M.D.   On: 04/24/2021 15:58    Procedures .Critical Care Performed by: Bud Face, PA-C Authorized by: Bud Face, PA-C   Critical care provider statement:    Critical care time (minutes):  35   Critical care start time:  04/24/2021 4:47 PM   Critical care end time:  04/24/2021 5:37 PM   Critical care was necessary to treat or prevent imminent or life-threatening deterioration of the following conditions:  Respiratory failure and sepsis   Critical care was time spent personally by me on the following activities:  Development of treatment plan with patient or surrogate, discussions with consultants, evaluation of patient's response to treatment, examination of patient, obtaining history from patient or surrogate, ordering and performing treatments and interventions, ordering and review of laboratory studies, ordering and review of radiographic studies, pulse oximetry, re-evaluation of patient's condition and review of old charts   Care discussed with: admitting provider     Medications Ordered in ED Medications  ipratropium-albuterol (DUONEB) 0.5-2.5 (3) MG/3ML nebulizer solution 3 mL (has no administration in time range)  cefTRIAXone (ROCEPHIN) 2 g in sodium chloride 0.9 % 100 mL IVPB ( Intravenous Restarted 04/24/21 1817)  azithromycin (ZITHROMAX) 500 mg in sodium chloride 0.9 % 250 mL IVPB (500 mg Intravenous New Bag/Given 04/24/21 1824)  lactated ringers infusion ( Intravenous New Bag/Given 04/24/21 1822)  methylPREDNISolone sodium succinate (SOLU-MEDROL) 125 mg/2 mL injection 125 mg (has no administration in time range)  albuterol (VENTOLIN HFA) 108 (90 Base) MCG/ACT inhaler 2 puff ( Inhalation Given 04/24/21 1723)    ED Course  I have reviewed the triage vital signs and the nursing notes.  Pertinent labs & imaging results that were available during my care of the patient were reviewed by me and considered in my medical decision making (see chart for  details).    MDM Rules/Calculators/A&P                         Patient presents today with hypoxic respiratory failure, satting in the 80s on room air, brought up to low 90s on 2 L nasal cannula. Also tachycardic in the 110s. Code sepsis called and treatment initiated for CAP.   Chest x-ray revealed 'Constellation of findings are favored to reflect pulmonary edema. Atypical infection could present similarly.' Lungs with wheezes in upper lobes and rales in lower lobes, will start nebs for same. BNP 128. WBC count 22.6. Lactic normal. COVID negative. Blood cultures pending. Doubt heart failure cause.   Patient now satting 90% on room air, however desatted to the 70s on ambulation. Heart rate originally tachycardic, normalized with fluids. Patient normotensive, no evidence of shock at this time. Given hypoxia with new oxygen requirement, will seek admission. Patient is amenable with this plan.  Hospitalist Dr. Sheran Lawless amenable with plans of admission.  Final Clinical Impression(s) / ED Diagnoses Final diagnoses:  Acute respiratory failure with hypoxia (HCC)  Sepsis, due to unspecified organism, unspecified whether acute organ dysfunction present [facility])    Rx / DC Orders ED Discharge Orders     None        Nestor Lewandowsky 04/24/21 2123    Noemi Chapel, MD 04/25/21 1505

## 2021-04-24 NOTE — H&P (Signed)
History and Physical  Jessica Lewis VOZ:[id number] DOB: [birth year] DOA: 04/24/2021  Referring physician: Lavonna Rua, PA-C, EDP PCP: Celene Squibb, MD  Outpatient Specialists:   Patient Coming From: home  Chief Complaint: SOB, cough  HPI: Jessica Lewis is a 75 y.o. female with a history of COPD, osteoporosis.  Patient seen for cough and shortness of breath that have been worsening over the past few days.  Patient was initially seen 10 days ago at urgent care, diagnosed with bronchitis, given prednisone taper.  She initially did okay, but has worsened over the past 2 days with increasing shortness of breath.  She went back to the urgent care and was found to have an oxygen saturation in the 80s that decreased when ambulating.  Her symptoms are worse with ambulation and improved with rest.  She was told to come to the emergency department for evaluation.  Here, she was initially hypoxic with improvement with an hour-long nebulizer treatment.  She still has desaturations when she ambulates.  Emergency Department Course: Chest x-ray significant for pulmonary edema versus atypical infection.  White count elevated to 13.  Patient given Rocephin and azithromycin and Solu-Medrol.  Review of Systems:   Pt denies any fevers, chills, nausea, vomiting, diarrhea, constipation, abdominal pain,  palpitations, headache, vision changes, lightheadedness, dizziness, melena, rectal bleeding.  Review of systems are otherwise negative  Past Medical History:  Diagnosis Date   Allergic rhinitis due to pollen    Bronchitis    Chronic obstructive pulmonary disease, unspecified ([place])    Cystocele 10/14/2015   Headache(784.0)    Menstral migraines   Osteoporosis    Pneumonia due to COVID-19 virus    PONV (postoperative nausea and vomiting)    Rectocele 11/16/2012   Rosacea    Past Surgical History:  Procedure Laterality Date   CHOLECYSTECTOMY     COLONOSCOPY  02/09/2011   Procedure: COLONOSCOPY;  Surgeon:  Rogene Houston, MD;  [facility];  Service: Endoscopy;  Laterality: N/A;   COLONOSCOPY WITH PROPOFOL N/A 12/18/2020   Procedure: COLONOSCOPY WITH PROPOFOL;  Surgeon: Harvel Quale, MD;  Location: [facility];  Service: Gastroenterology;  Laterality: N/A;  8:15   POLYPECTOMY  12/18/2020   Procedure: POLYPECTOMY INTESTINAL;  Surgeon: Harvel Quale, MD;  Location: [facility];  Service: Gastroenterology;;   TUBAL LIGATION Bilateral    Social History:  reports that she quit smoking about 35 years ago. Her smoking use included cigarettes. She has a 22.50 pack-year smoking history. She has never used smokeless tobacco. She reports that she does not drink alcohol and does not use drugs. Patient lives at home  Allergies  Allergen Reactions   Codeine Nausea And Vomiting   Tetracyclines & Related Rash   Claritin [Loratadine] Hives    Family History  Problem Relation Age of Onset   Stroke Mother       Prior to Admission medications   Medication Sig Start Date End Date Taking? Authorizing Provider  albuterol (VENTOLIN HFA) 108 (90 Base) MCG/ACT inhaler Inhale 2 puffs into the lungs every 6 (six) hours as needed for wheezing or shortness of breath.    [provider]  Ascorbic Acid (VITAMIN C) 500 MG CHEW Chew 500 mg by mouth daily.    [provider]  aspirin EC 81 MG tablet Take 81 mg by mouth daily.    [provider]  atorvastatin (LIPITOR) 20 MG tablet Take 0.5 tablets (10 mg total) by mouth daily at  6 PM. 07/30/19 04/03/20  Maryruth Hancock, MD  calcium carbonate (OS-CAL - DOSED IN MG OF ELEMENTAL CALCIUM) 1250 (500 Ca) MG tablet Take 1,000 mg by mouth daily.    [provider]  Cholecalciferol (VITAMIN D3) 50 MCG (2000 UT) TABS Take 2,000 Units by mouth daily.    [provider]  Fluticasone-Umeclidin-Vilant (TRELEGY ELLIPTA) 100-62.5-25 MCG/INH AEPB Inhale 1 puff into the lungs daily.    [provider]   losartan (COZAAR) 25 MG tablet TAKE 1 TABLET BY MOUTH ONCE DAILY. 04/13/21   Satira Sark, MD  metoprolol tartrate (LOPRESSOR) 25 MG tablet TAKE 1/2 TABLET BY MOUTH TWICE DAILY. Patient taking differently: Take 12.5 mg by mouth 2 (two) times daily. 11/14/19   Corum, Rex Kras, MD  Multiple Vitamins-Minerals (ONE-A-DAY WOMENS PETITES PO) Take 1 tablet by mouth daily.    [provider]  predniSONE (STERAPRED UNI-PAK 21 TAB) 10 MG (21) TBPK tablet Take by mouth daily. Take 6 tabs by mouth daily  for 2 days, then 5 tabs for 2 days, then 4 tabs for 2 days, then 3 tabs for 2 days, 2 tabs for 2 days, then 1 tab by mouth daily for 2 days 04/19/21   Hazel Sams, PA-C  Promethazine HCl 6.25 MG/5ML SOLN Take 5 mLs by mouth every 4 (four) hours as needed. 11/16/20   [provider]  zinc gluconate 50 MG tablet Take 50 mg by mouth daily.    [provider]    Physical Exam: BP (!) 155/69   Pulse (!) 106   Temp 98.6 F (37 C) (Oral)   Resp (!) 21   Ht 5\' 2"  (1.575 m)   Wt 106.6 kg   SpO2 93%   BMI 42.98 kg/m   General: Elderly female. Awake and alert and oriented x3. No acute cardiopulmonary distress.  HEENT: Normocephalic atraumatic.  Right and left ears normal in appearance.  Pupils equal, round, reactive to light. Extraocular muscles are intact. Sclerae anicteric and noninjected.  Moist mucosal membranes. No mucosal lesions.  Neck: Neck supple without lymphadenopathy. No carotid bruits. No masses palpated.  Cardiovascular: Regular rate with normal S1-S2 sounds. No murmurs, rubs, gallops auscultated. No JVD.  Respiratory: Expiratory wheezes.  Mild rales in the mid lung fields.  No accessory muscle use. Abdomen: Soft, nontender, nondistended. Active bowel sounds. No masses or hepatosplenomegaly  Skin: No rashes, lesions, or ulcerations.  Dry, warm to touch. 2+ dorsalis pedis and radial pulses. Musculoskeletal: No calf or leg pain. All major joints not erythematous  nontender.  No upper or lower joint deformation.  Good ROM.  No contractures  Psychiatric: Intact judgment and insight. Pleasant and cooperative. Neurologic: No focal neurological deficits. Strength is 5/5 and symmetric in upper and lower extremities.  Cranial nerves II through XII are grossly intact.           Labs on Admission: I have personally reviewed following labs and imaging studies  CBC: Recent Labs  Lab 04/24/21 1630  WBC 22.6*  NEUTROABS 20.3*  HGB 13.0  HCT 39.6  MCV 91.5  PLT 076*   Basic Metabolic Panel: Recent Labs  Lab 04/24/21 1630  NA 134*  K 4.2  CL 100  CO2 27  GLUCOSE 188*  BUN 18  CREATININE 0.69  CALCIUM 8.2*   GFR: Estimated Creatinine Clearance: 70.8 mL/min (by C-G formula based on SCr of 0.69 mg/dL). Liver Function Tests: No results for input(s): AST, ALT, ALKPHOS, BILITOT, PROT, ALBUMIN in the last  168 hours. No results for input(s): LIPASE, AMYLASE in the last 168 hours. No results for input(s): AMMONIA in the last 168 hours. Coagulation Profile: Recent Labs  Lab 04/24/21 1630  INR 1.1   Cardiac Enzymes: No results for input(s): CKTOTAL, CKMB, CKMBINDEX, TROPONINI in the last 168 hours. BNP (last 3 results) No results for input(s): PROBNP in the last 8760 hours. HbA1C: No results for input(s): HGBA1C in the last 72 hours. CBG: No results for input(s): GLUCAP in the last 168 hours. Lipid Profile: No results for input(s): CHOL, HDL, LDLCALC, TRIG, CHOLHDL, LDLDIRECT in the last 72 hours. Thyroid Function Tests: No results for input(s): TSH, T4TOTAL, FREET4, T3FREE, THYROIDAB in the last 72 hours. Anemia Panel: No results for input(s): VITAMINB12, FOLATE, FERRITIN, TIBC, IRON, RETICCTPCT in the last 72 hours. Urine analysis:    Component Value Date/Time   COLORURINE YELLOW 04/24/2021 1819   APPEARANCEUR CLEAR 04/24/2021 1819   LABSPEC 1.013 04/24/2021 1819   PHURINE 6.0 04/24/2021 1819   GLUCOSEU NEGATIVE 04/24/2021 1819    HGBUR NEGATIVE 04/24/2021 1819   BILIRUBINUR NEGATIVE 04/24/2021 1819   KETONESUR NEGATIVE 04/24/2021 1819   PROTEINUR NEGATIVE 04/24/2021 1819   NITRITE NEGATIVE 04/24/2021 1819   LEUKOCYTESUR NEGATIVE 04/24/2021 1819   Sepsis Labs: @LABRCNTIP (procalcitonin:4,lacticidven:4) ) Recent Results (from the past 240 hour(s))  Resp Panel by RT-PCR (Flu A&B, Covid) Nasopharyngeal Swab     Status: None   Collection Time: 04/24/21  5:02 PM   Specimen: Nasopharyngeal Swab; Nasopharyngeal(NP) swabs in vial transport medium  Result Value Ref Range Status   SARS Coronavirus 2 by RT PCR NEGATIVE NEGATIVE Final    Comment: (NOTE) SARS-CoV-2 target nucleic acids are NOT DETECTED.  The SARS-CoV-2 RNA is generally detectable in upper respiratory specimens during the acute phase of infection. The lowest concentration of SARS-CoV-2 viral copies this assay can detect is 138 copies/mL. A negative result does not preclude SARS-Cov-2 infection and should not be used as the sole basis for treatment or other patient management decisions. A negative result may occur with  improper specimen collection/handling, submission of specimen other than nasopharyngeal swab, presence of viral mutation(s) within the areas targeted by this assay, and inadequate number of viral copies(<138 copies/mL). A negative result must be combined with clinical observations, patient history, and epidemiological information. The expected result is Negative.  Fact Sheet for Patients:  EntrepreneurPulse.com.au  Fact Sheet for Healthcare Providers:  IncredibleEmployment.be  This test is no t yet approved or cleared by the [place] FDA and  has been authorized for detection and/or diagnosis of SARS-CoV-2 by FDA under an Emergency Use Authorization (EUA). This EUA will remain  in effect (meaning this test can be used) for the duration of the COVID-19 declaration under Section 564(b)(1) of the  Act, 21 U.S.C.section 360bbb-3(b)(1), unless the authorization is terminated  or revoked sooner.       Influenza A by PCR NEGATIVE NEGATIVE Final   Influenza B by PCR NEGATIVE NEGATIVE Final    Comment: (NOTE) The Xpert Xpress SARS-CoV-2/FLU/RSV plus assay is intended as an aid in the diagnosis of influenza from Nasopharyngeal swab specimens and should not be used as a sole basis for treatment. Nasal washings and aspirates are unacceptable for Xpert Xpress SARS-CoV-2/FLU/RSV testing.  Fact Sheet for Patients: EntrepreneurPulse.com.au  Fact Sheet for Healthcare Providers: IncredibleEmployment.be  This test is not yet approved or cleared by the [place] FDA and has been authorized for detection and/or diagnosis of SARS-CoV-2 by FDA under an Emergency Use Authorization (  EUA). This EUA will remain in effect (meaning this test can be used) for the duration of the COVID-19 declaration under Section 564(b)(1) of the Act, 21 U.S.C. section 360bbb-3(b)(1), unless the authorization is terminated or revoked.  Performed at [facility], [address], [place], [place] [address]   Blood Culture (routine x 2)     Status: None (Preliminary result)   Collection Time: 04/24/21  6:21 PM   Specimen: BLOOD LEFT HAND  Result Value Ref Range Status   Specimen Description BLOOD LEFT HAND  Final   Special Requests   Final    BOTTLES DRAWN AEROBIC AND ANAEROBIC Blood Culture adequate volume Performed at [facility], [address] [place], [place] [address]    Culture PENDING  Incomplete   Report Status PENDING  Incomplete  Blood Culture (routine x 2)     Status: None (Preliminary result)   Collection Time: 04/24/21  6:21 PM   Specimen: Left Antecubital; Blood  Result Value Ref Range Status   Specimen Description LEFT ANTECUBITAL  Final   Special Requests   Final    BOTTLES DRAWN AEROBIC AND ANAEROBIC Blood Culture adequate volume Performed at  [facility], [address] [place], [place] [address]    Culture PENDING  Incomplete   Report Status PENDING  Incomplete     Radiological Exams on Admission: DG Chest 2 View  Result Date: 04/24/2021 CLINICAL DATA:  SOB EXAM: CHEST - 2 VIEW COMPARISON:  April 19, 2016 FINDINGS: The cardiomediastinal silhouette is unchanged in contour.Atherosclerotic calcifications. No pleural effusion. No pneumothorax. Diffuse reticular prominence with perihilar vascular congestion and peribronchial cuffing, mildly increased. Visualized abdomen is unremarkable. Multilevel degenerative changes of the thoracic spine. IMPRESSION: Constellation of findings are favored to reflect pulmonary edema. Atypical infection could present similarly. Electronically Signed   By: Valentino Saxon M.D.   On: 04/24/2021 15:58    EKG: Independently reviewed.  Sinus tachycardia with borderline right axis deviation.  Left atrium enlargement.  No acute ST changes  Assessment/Plan: Active Problems:   Acute respiratory failure with hypoxia (HCC)   CAP (community acquired pneumonia)   COPD with acute exacerbation ([place])    This patient was discussed with the ED physician, including pertinent vitals, physical exam findings, labs, and imaging.  We also discussed care given by the ED provider.  Respiratory failure with hypoxia secondary to COPD exacerbation and commune acquired pneumonia Antibiotics: Rocephin and azithromycin Robitussin Blood cultures drawn in the emergency department Sputum cultures CBC tomorrow Strep and Legionella antigen by urine Influenza screen and COVID screen negative DuoNeb's every 6 scheduled with albuterol every 2 when necessary Continue inhaled steroids and LA bronchodilator Solu-Medrol 60 mg IV every 12 hours Mucinex  DVT prophylaxis: lovenox Consultants: none Code Status: Full Family Communication: daughter present  Disposition Plan:    Truett Mainland, DO

## 2021-04-24 NOTE — ED Triage Notes (Signed)
Patient started having "allergy/sinus type symptoms" on Monday which has progressively gotten worse. Patient has productive cough with thick yellow-green sputum. Patient seen at Urgent Care on Monday and had chest x-ray which indicated bronchitis. Patient placed on pred pack and told to use prescribed inhaler for COPD. Patient has had no improvement and went back to Urgent Care today. Patient's sats in 25s and desated into 70s after returning from x-ray. X-ray still showed bronchitis. Per daughter patient not given any antibiotics.

## 2021-04-24 NOTE — ED Notes (Signed)
Patient placed on 1 L [place].

## 2021-04-24 NOTE — Sepsis Progress Note (Signed)
[facility] is monitoring code sepsis 

## 2021-04-25 DIAGNOSIS — J9601 Acute respiratory failure with hypoxia: Secondary | ICD-10-CM

## 2021-04-25 DIAGNOSIS — J189 Pneumonia, unspecified organism: Secondary | ICD-10-CM

## 2021-04-25 DIAGNOSIS — A419 Sepsis, unspecified organism: Principal | ICD-10-CM

## 2021-04-25 DIAGNOSIS — J441 Chronic obstructive pulmonary disease with (acute) exacerbation: Secondary | ICD-10-CM

## 2021-04-25 LAB — CBC
HCT: 34.8 % — ABNORMAL LOW (ref 36.0–46.0)
Hemoglobin: 11.2 g/dL — ABNORMAL LOW (ref 12.0–15.0)
MCH: 29.7 pg (ref 26.0–34.0)
MCHC: 32.2 g/dL (ref 30.0–36.0)
MCV: 92.3 fL (ref 80.0–100.0)
Platelets: 367 10*3/uL (ref 150–400)
RBC: 3.77 MIL/uL — ABNORMAL LOW (ref 3.87–5.11)
RDW: 15.8 % — ABNORMAL HIGH (ref 11.5–15.5)
WBC: 17 10*3/uL — ABNORMAL HIGH (ref 4.0–10.5)
nRBC: 0 % (ref 0.0–0.2)

## 2021-04-25 LAB — BASIC METABOLIC PANEL
Anion gap: 7 (ref 5–15)
BUN: 15 mg/dL (ref 8–23)
CO2: 30 mmol/L (ref 22–32)
Calcium: 8.1 mg/dL — ABNORMAL LOW (ref 8.9–10.3)
Chloride: 102 mmol/L (ref 98–111)
Creatinine, Ser: 0.63 mg/dL (ref 0.44–1.00)
GFR, Estimated: >60 mL/min (ref >60)
Glucose, Bld: 106 mg/dL — ABNORMAL HIGH (ref 70–99)
Potassium: 4.1 mmol/L (ref 3.5–5.1)
Sodium: 139 mmol/L (ref 135–145)

## 2021-04-25 LAB — STREP PNEUMONIAE URINARY ANTIGEN: Strep Pneumo Urinary Antigen: NEGATIVE

## 2021-04-25 LAB — PROCALCITONIN: Procalcitonin: <0.1 ng/mL

## 2021-04-25 MED ORDER — BUDESONIDE 0.5 MG/2ML IN SUSP
0.5000 mg | Freq: Two times a day (BID) | RESPIRATORY_TRACT | Status: DC
  Administered 2021-04-25 – 2021-04-27 (×5): 0.5 mg via RESPIRATORY_TRACT
  Filled 2021-04-25 (×5): qty 2

## 2021-04-25 MED ORDER — METHYLPREDNISOLONE SODIUM SUCC 125 MG IJ SOLR
60.0000 mg | Freq: Two times a day (BID) | INTRAMUSCULAR | Status: DC
  Administered 2021-04-25 – 2021-04-27 (×4): 60 mg via INTRAVENOUS
  Filled 2021-04-25 (×4): qty 2

## 2021-04-25 MED ORDER — ARFORMOTEROL TARTRATE 15 MCG/2ML IN NEBU
15.0000 ug | INHALATION_SOLUTION | Freq: Two times a day (BID) | RESPIRATORY_TRACT | Status: DC
  Administered 2021-04-25 – 2021-04-27 (×5): 15 ug via RESPIRATORY_TRACT
  Filled 2021-04-25 (×5): qty 2

## 2021-04-25 MED ORDER — FUROSEMIDE 10 MG/ML IJ SOLN
20.0000 mg | Freq: Once | INTRAMUSCULAR | Status: AC
  Administered 2021-04-25: 20 mg via INTRAVENOUS
  Filled 2021-04-25: qty 2

## 2021-04-25 NOTE — Progress Notes (Signed)
PROGRESS NOTE  Jessica Lewis JSE:[id number] DOB: [birth year] DOA: 04/24/2021 PCP: Celene Squibb, MD  Brief History:  75 year old female with a history of coronary artery disease, hyperlipidemia, COPD, migraine headache, diastolic CHF presenting with at least 10-day history of coughing with yellow sputum and shortness of breath.  The patient went to urgent care on 04/19/2021.  She was diagnosed with bronchitis and given a prednisone taper.  She had some initial improvement, but over the past 2 days prior to this admission she had worsening shortness of breath, dyspnea on exertion, and persistent coughing with green sputum.  She did complain of a low-grade fever at home up to 100.5 F.  She went back to urgent care on the day of admission, and she was noted to have oxygen saturation in the 80s.  She was directed to the emergency department for further evaluation.  She quit smoking in 1987 after a 10-pack-year history.  She denies any headache, neck pain, chest pain, hemoptysis, nausea, vomiting, diarrhea, abdominal pain, dysuria, hematuria. In the emergency department, patient was hemodynamically stable but hypoxic with oxygen saturation of 90% on 2 L.  She had low-grade temperature of 99.1 F.  BMP showed sodium 134, potassium 4.2, serum creatinine 0.69.  WBC 22.6, hemoglobin 13.0, platelets 437,000.  Lactic acid was 1.3.  VBG showed pH 7.44 and PCO2 43.  The patient was started on IV Solu-Medrol, ceftriaxone, and azithromycin.  Assessment/Plan: Sepsis -Present on admission -Secondary to pneumonia -Presented with leukocytosis, tachycardia, and tachypnea -Lactic acid peaked 1.3 -Check PCT -Continue ceftriaxone and azithromycin  Acute respiratory failure with hypoxia -Secondary to pneumonia and COPD exacerbation -Presented with oxygen saturation in the 80s on room air with tachypnea and increased work of breathing -Stable on 2 L -Wean oxygen for saturation greater  92%  Pneumonia -Personally reviewed chest x-ray--increase interstitial prominence with scatteredinterstitial opacities -Continue ceftriaxone and azithromycin  COPD exacerbation -add pulmicort -continue duoneb -add brovana  Chronic diastolic CHF -lasix 20 mg IV x 1 -09/19/19 Echo--EF 55-60, G1DD, no WMA, mild MR  Coronary artery disease -No chest pain presently -Continue metoprolol titrate  Essential hypertension -Continue metoprolol tartrate and losartan  Hyperlipidemia -Continue statin  Morbid obesity -BMI 41.67 -Lifestyle modification      Status is: Inpatient  Remains inpatient appropriate because: severity of illness requiring IV abx, IV steroids, and frequent nebulizer tx        Family Communication:   daughter updated 10/23  Consultants:  none  Code Status:  FULL   DVT Prophylaxis:   [place] Lovenox   Procedures: As Listed in Progress Note Above  Antibiotics: Ceftriaxone 10/22>> Azithro 10/22>>     Subjective: Patient breathing better, but remains sob.  Complains of cough with green sputum, no much better.  Denies f/c, headache, cp, n/v/d, abd pain.  Objective: Vitals:   04/24/21 2150 04/24/21 2157 04/25/21 0122 04/25/21 0610  BP:   (!) 163/56 (!) 144/57  Pulse: 94 90 93 82  Resp:   18 18  Temp:   98.6 F (37 C) 98.4 F (36.9 C)  TempSrc:      SpO2:  91% 95% 96%  Weight:      Height:        Intake/Output Summary (Last 24 hours) at 04/25/2021 0754 Last data filed at 04/25/2021 0526 Gross per 24 hour  Intake 593.92 ml  Output 800 ml  Net -206.08 ml   Weight change:  Exam:  General:  Pt is alert, follows commands appropriately, not in acute distress HEENT: No icterus, No thrush, No neck mass, [place]/AT Cardiovascular: RRR, S1/S2, no rubs, no gallops Respiratory: bilateral rales, L>R Abdomen: Soft/+BS, non tender, non distended, no guarding Extremities: trace LE edema, No lymphangitis, No petechiae, No rashes, no  synovitis   Data Reviewed: I have personally reviewed following labs and imaging studies Basic Metabolic Panel: Recent Labs  Lab 04/24/21 1630 04/25/21 0430  NA 134* 139  K 4.2 4.1  CL 100 102  CO2 27 30  GLUCOSE 188* 106*  BUN 18 15  CREATININE 0.69 0.63  CALCIUM 8.2* 8.1*   Liver Function Tests: No results for input(s): AST, ALT, ALKPHOS, BILITOT, PROT, ALBUMIN in the last 168 hours. No results for input(s): LIPASE, AMYLASE in the last 168 hours. No results for input(s): AMMONIA in the last 168 hours. Coagulation Profile: Recent Labs  Lab 04/24/21 1630  INR 1.1   CBC: Recent Labs  Lab 04/24/21 1630 04/25/21 0430  WBC 22.6* 17.0*  NEUTROABS 20.3*  --   HGB 13.0 11.2*  HCT 39.6 34.8*  MCV 91.5 92.3  PLT 437* 367   Cardiac Enzymes: No results for input(s): CKTOTAL, CKMB, CKMBINDEX, TROPONINI in the last 168 hours. BNP: Invalid input(s): POCBNP CBG: No results for input(s): GLUCAP in the last 168 hours. HbA1C: No results for input(s): HGBA1C in the last 72 hours. Urine analysis:    Component Value Date/Time   COLORURINE YELLOW 04/24/2021 1819   APPEARANCEUR CLEAR 04/24/2021 1819   LABSPEC 1.013 04/24/2021 1819   PHURINE 6.0 04/24/2021 1819   GLUCOSEU NEGATIVE 04/24/2021 1819   HGBUR NEGATIVE 04/24/2021 1819   BILIRUBINUR NEGATIVE 04/24/2021 1819   KETONESUR NEGATIVE 04/24/2021 1819   PROTEINUR NEGATIVE 04/24/2021 1819   NITRITE NEGATIVE 04/24/2021 1819   LEUKOCYTESUR NEGATIVE 04/24/2021 1819   Sepsis Labs: @LABRCNTIP (procalcitonin:4,lacticidven:4) ) Recent Results (from the past 240 hour(s))  Resp Panel by RT-PCR (Flu A&B, Covid) Nasopharyngeal Swab     Status: None   Collection Time: 04/24/21  5:02 PM   Specimen: Nasopharyngeal Swab; Nasopharyngeal(NP) swabs in vial transport medium  Result Value Ref Range Status   SARS Coronavirus 2 by RT PCR NEGATIVE NEGATIVE Final    Comment: (NOTE) SARS-CoV-2 target nucleic acids are NOT DETECTED.  The  SARS-CoV-2 RNA is generally detectable in upper respiratory specimens during the acute phase of infection. The lowest concentration of SARS-CoV-2 viral copies this assay can detect is 138 copies/mL. A negative result does not preclude SARS-Cov-2 infection and should not be used as the sole basis for treatment or other patient management decisions. A negative result may occur with  improper specimen collection/handling, submission of specimen other than nasopharyngeal swab, presence of viral mutation(s) within the areas targeted by this assay, and inadequate number of viral copies(<138 copies/mL). A negative result must be combined with clinical observations, patient history, and epidemiological information. The expected result is Negative.  Fact Sheet for Patients:  EntrepreneurPulse.com.au  Fact Sheet for Healthcare Providers:  IncredibleEmployment.be  This test is no t yet approved or cleared by the [place] FDA and  has been authorized for detection and/or diagnosis of SARS-CoV-2 by FDA under an Emergency Use Authorization (EUA). This EUA will remain  in effect (meaning this test can be used) for the duration of the COVID-19 declaration under Section 564(b)(1) of the Act, 21 U.S.C.section 360bbb-3(b)(1), unless the authorization is terminated  or revoked sooner.       Influenza A by PCR NEGATIVE NEGATIVE Final  Influenza B by PCR NEGATIVE NEGATIVE Final    Comment: (NOTE) The Xpert Xpress SARS-CoV-2/FLU/RSV plus assay is intended as an aid in the diagnosis of influenza from Nasopharyngeal swab specimens and should not be used as a sole basis for treatment. Nasal washings and aspirates are unacceptable for Xpert Xpress SARS-CoV-2/FLU/RSV testing.  Fact Sheet for Patients: EntrepreneurPulse.com.au  Fact Sheet for Healthcare Providers: IncredibleEmployment.be  This test is not yet approved or  cleared by the [place] FDA and has been authorized for detection and/or diagnosis of SARS-CoV-2 by FDA under an Emergency Use Authorization (EUA). This EUA will remain in effect (meaning this test can be used) for the duration of the COVID-19 declaration under Section 564(b)(1) of the Act, 21 U.S.C. section 360bbb-3(b)(1), unless the authorization is terminated or revoked.  Performed at [facility], [address] [place], [place] [address]   Blood Culture (routine x 2)     Status: None (Preliminary result)   Collection Time: 04/24/21  6:21 PM   Specimen: BLOOD LEFT HAND  Result Value Ref Range Status   Specimen Description BLOOD LEFT HAND  Final   Special Requests   Final    BOTTLES DRAWN AEROBIC AND ANAEROBIC Blood Culture adequate volume Performed at [facility], [address] [place], [place] [address]    Culture PENDING  Incomplete   Report Status PENDING  Incomplete  Blood Culture (routine x 2)     Status: None (Preliminary result)   Collection Time: 04/24/21  6:21 PM   Specimen: Left Antecubital; Blood  Result Value Ref Range Status   Specimen Description LEFT ANTECUBITAL  Final   Special Requests   Final    BOTTLES DRAWN AEROBIC AND ANAEROBIC Blood Culture adequate volume Performed at [facility], [address] [place], [place] [address]    Culture PENDING  Incomplete   Report Status PENDING  Incomplete     Scheduled Meds:  aspirin EC  81 mg Oral Daily   atorvastatin  10 mg Oral q1800   enoxaparin (LOVENOX) injection  50 mg Subcutaneous Q24H   fluticasone furoate-vilanterol  1 puff Inhalation Daily   ipratropium-albuterol  3 mL Nebulization Q6H   losartan  25 mg Oral Daily   methylPREDNISolone (SOLU-MEDROL) injection  60 mg Intravenous Q12H   Followed by   Derrill Memo ON 04/26/2021] predniSONE  40 mg Oral Q breakfast   metoprolol tartrate  12.5 mg Oral BID   umeclidinium bromide  1 puff Inhalation Daily   Continuous Infusions:   azithromycin Stopped (04/24/21 2019)   cefTRIAXone (ROCEPHIN)  IV Stopped (04/24/21 2019)   cefTRIAXone (ROCEPHIN)  IV      Procedures/Studies: DG Chest 2 View  Result Date: 04/24/2021 CLINICAL DATA:  SOB EXAM: CHEST - 2 VIEW COMPARISON:  April 19, 2016 FINDINGS: The cardiomediastinal silhouette is unchanged in contour.Atherosclerotic calcifications. No pleural effusion. No pneumothorax. Diffuse reticular prominence with perihilar vascular congestion and peribronchial cuffing, mildly increased. Visualized abdomen is unremarkable. Multilevel degenerative changes of the thoracic spine. IMPRESSION: Constellation of findings are favored to reflect pulmonary edema. Atypical infection could present similarly. Electronically Signed   By: Valentino Saxon M.D.   On: 04/24/2021 15:58   DG Chest 2 View  Result Date: 04/19/2021 CLINICAL DATA:  Wheezing and shortness of breath following viral upper respiratory infection. EXAM: CHEST - 2 VIEW COMPARISON:  09/06/2019 FINDINGS: Coarse lung markings are chronic. No focal airspace disease or lung consolidation. Heart size is upper limits of normal and stable. Atherosclerotic calcifications at  the aortic arch. No pleural effusions. No acute bone abnormality. IMPRESSION: Chronic lung changes without acute findings. Electronically Signed   By: Markus Daft M.D.   On: 04/19/2021 16:22    Orson Eva, DO  [facility]  If 7PM-7AM, please contact night-coverage [email or web address] Password [email or web address] 04/25/2021, 7:54 AM   LOS: 1 day

## 2021-04-26 ENCOUNTER — Inpatient Hospital Stay (HOSPITAL_COMMUNITY): Payer: Medicare Other

## 2021-04-26 DIAGNOSIS — I5031 Acute diastolic (congestive) heart failure: Secondary | ICD-10-CM

## 2021-04-26 LAB — CBC
HCT: 37.6 % (ref 36.0–46.0)
Hemoglobin: 12.1 g/dL (ref 12.0–15.0)
MCH: 29.8 pg (ref 26.0–34.0)
MCHC: 32.2 g/dL (ref 30.0–36.0)
MCV: 92.6 fL (ref 80.0–100.0)
Platelets: 395 10*3/uL (ref 150–400)
RBC: 4.06 MIL/uL (ref 3.87–5.11)
RDW: 15.9 % — ABNORMAL HIGH (ref 11.5–15.5)
WBC: 14.3 10*3/uL — ABNORMAL HIGH (ref 4.0–10.5)
nRBC: 0 % (ref 0.0–0.2)

## 2021-04-26 LAB — MAGNESIUM: Magnesium: 2 mg/dL (ref 1.7–2.4)

## 2021-04-26 LAB — BASIC METABOLIC PANEL
Anion gap: 7 (ref 5–15)
BUN: 15 mg/dL (ref 8–23)
CO2: 31 mmol/L (ref 22–32)
Calcium: 8.3 mg/dL — ABNORMAL LOW (ref 8.9–10.3)
Chloride: 100 mmol/L (ref 98–111)
Creatinine, Ser: 0.61 mg/dL (ref 0.44–1.00)
GFR, Estimated: >60 mL/min (ref >60)
Glucose, Bld: 168 mg/dL — ABNORMAL HIGH (ref 70–99)
Potassium: 4.8 mmol/L (ref 3.5–5.1)
Sodium: 138 mmol/L (ref 135–145)

## 2021-04-26 LAB — ECHOCARDIOGRAM COMPLETE
AR max vel: 2.89 cm2
AV Area VTI: 3.24 cm2
AV Area mean vel: 2.88 cm2
AV Mean grad: 4 mmHg
AV Peak grad: 8.9 mmHg
Ao pk vel: 1.49 m/s
Area-P 1/2: 4.49 cm2
Calc EF: 60.5 %
Height: 62 in
MV VTI: 3.76 cm2
S' Lateral: 2.8 cm
Single Plane A2C EF: 54.3 %
Single Plane A4C EF: 67.8 %
Weight: 3644.8 oz

## 2021-04-26 LAB — LEGIONELLA PNEUMOPHILA SEROGP 1 UR AG: L. pneumophila Serogp 1 Ur Ag: NEGATIVE

## 2021-04-26 MED ORDER — ACETAMINOPHEN 325 MG PO TABS: 650.0000 mg | ORAL_TABLET | Freq: Four times a day (QID) | ORAL | Status: DC | PRN

## 2021-04-26 MED ORDER — FUROSEMIDE 10 MG/ML IJ SOLN: 20.0000 mg | Freq: Once | INTRAMUSCULAR | Status: DC

## 2021-04-26 MED ORDER — FUROSEMIDE 10 MG/ML IJ SOLN
40.0000 mg | Freq: Once | INTRAMUSCULAR | Status: AC
  Administered 2021-04-26: 40 mg via INTRAVENOUS
  Filled 2021-04-26: qty 4

## 2021-04-26 NOTE — Progress Notes (Signed)
PROGRESS NOTE  Jessica Lewis NGE:[id number] DOB: February 06, 1946 DOA: 04/24/2021 PCP: Celene Squibb, MD  Brief History:  75 year old female with a history of coronary artery disease, hyperlipidemia, COPD, migraine headache, diastolic CHF presenting with at least 10-day history of coughing with yellow sputum and shortness of breath.  The patient went to urgent care on 04/19/2021.  She was diagnosed with bronchitis and given a prednisone taper.  She had some initial improvement, but over the past 2 days prior to this admission she had worsening shortness of breath, dyspnea on exertion, and persistent coughing with green sputum.  She did complain of a low-grade fever at home up to 100.5 F.  She went back to urgent care on the day of admission, and she was noted to have oxygen saturation in the 80s.  She was directed to the emergency department for further evaluation.  She quit smoking in 1987 after a 10-pack-year history.  She denies any headache, neck pain, chest pain, hemoptysis, nausea, vomiting, diarrhea, abdominal pain, dysuria, hematuria. In the emergency department, patient was hemodynamically stable but hypoxic with oxygen saturation of 90% on 2 L.  She had low-grade temperature of 99.1 F.  BMP showed sodium 134, potassium 4.2, serum creatinine 0.69.  WBC 22.6, hemoglobin 13.0, platelets 437,000.  Lactic acid was 1.3.  VBG showed pH 7.44 and PCO2 43.  The patient was started on IV Solu-Medrol, ceftriaxone, and azithromycin.   Assessment/Plan: Sepsis -Present on admission -Secondary to pneumonia -Presented with leukocytosis, tachycardia, and tachypnea -Lactic acid peaked 1.3 -Check PCT <0.10 -Continue ceftriaxone and azithromycin   Acute respiratory failure with hypoxia -Secondary to pneumonia and COPD exacerbation -Presented with oxygen saturation in the 80s on room air with tachypnea and increased work of breathing -Stable on 2 L -Wean oxygen for saturation greater 92%    Pneumonia -Personally reviewed chest x-ray--increase interstitial prominence with scatteredinterstitial opacities -Continue ceftriaxone and azithromycin   COPD exacerbation -continue pulmicort -continue duoneb -continue brovana   Acute on Chronic diastolic CHF -continue IV lasix -09/19/19 Echo--EF 55-60, G1DD, no WMA, mild MR -repeat Echo   Coronary artery disease -No chest pain presently -Continue metoprolol titrate   Essential hypertension -Continue metoprolol tartrate and losartan   Hyperlipidemia -Continue statin   Morbid obesity -BMI 41.67 -Lifestyle modification           Status is: Inpatient   Remains inpatient appropriate because: severity of illness requiring IV abx, IV steroids, and frequent nebulizer tx               Family Communication:   daughter updated 10/23   Consultants:  none   Code Status:  FULL    DVT Prophylaxis:   [place] Lovenox     Procedures: As Listed in Progress Note Above   Antibiotics: Ceftriaxone 10/22>> Azithro 10/22>>     Subjective: She is breathing better today, but still has some dyspnea on exertion.  Denies f/c, cp, n/v/d, abd pain  Objective: Vitals:   04/26/21 0532 04/26/21 0750 04/26/21 0808 04/26/21 0817  BP: (!) 149/59 (!) 152/73    Pulse: 82 90    Resp: 14 20    Temp: 97.8 F (36.6 C) 98.1 F (36.7 C)    TempSrc: Oral Oral    SpO2: 95%  95% 96%  Weight:      Height:   5\' 2"  (1.575 m)     Intake/Output Summary (Last 24 hours) at 04/26/2021 0901 Last data filed  at 04/26/2021 0300 Gross per 24 hour  Intake 930 ml  Output 3250 ml  Net -2320 ml   Weight change:  Exam:  General:  Pt is alert, follows commands appropriately, not in acute distress HEENT: No icterus, No thrush, No neck mass, [place]/AT Cardiovascular: RRR, S1/S2, no rubs, no gallops Respiratory: bibasilar rales. No wheeze Abdomen: Soft/+BS, non tender, non distended, no guarding Extremities: trace LE edema, No lymphangitis, No  petechiae, No rashes, no synovitis   Data Reviewed: I have personally reviewed following labs and imaging studies Basic Metabolic Panel: Recent Labs  Lab 04/24/21 1630 04/25/21 0430 04/26/21 0357  NA 134* 139 138  K 4.2 4.1 4.8  CL 100 102 100  CO2 27 30 31   GLUCOSE 188* 106* 168*  BUN 18 15 15   CREATININE 0.69 0.63 0.61  CALCIUM 8.2* 8.1* 8.3*  MG  --   --  2.0   Liver Function Tests: No results for input(s): AST, ALT, ALKPHOS, BILITOT, PROT, ALBUMIN in the last 168 hours. No results for input(s): LIPASE, AMYLASE in the last 168 hours. No results for input(s): AMMONIA in the last 168 hours. Coagulation Profile: Recent Labs  Lab 04/24/21 1630  INR 1.1   CBC: Recent Labs  Lab 04/24/21 1630 04/25/21 0430 04/26/21 0357  WBC 22.6* 17.0* 14.3*  NEUTROABS 20.3*  --   --   HGB 13.0 11.2* 12.1  HCT 39.6 34.8* 37.6  MCV 91.5 92.3 92.6  PLT 437* 367 395   Cardiac Enzymes: No results for input(s): CKTOTAL, CKMB, CKMBINDEX, TROPONINI in the last 168 hours. BNP: Invalid input(s): POCBNP CBG: No results for input(s): GLUCAP in the last 168 hours. HbA1C: No results for input(s): HGBA1C in the last 72 hours. Urine analysis:    Component Value Date/Time   COLORURINE YELLOW 04/24/2021 1819   APPEARANCEUR CLEAR 04/24/2021 1819   LABSPEC 1.013 04/24/2021 1819   PHURINE 6.0 04/24/2021 1819   GLUCOSEU NEGATIVE 04/24/2021 1819   HGBUR NEGATIVE 04/24/2021 1819   BILIRUBINUR NEGATIVE 04/24/2021 1819   KETONESUR NEGATIVE 04/24/2021 1819   PROTEINUR NEGATIVE 04/24/2021 1819   NITRITE NEGATIVE 04/24/2021 1819   LEUKOCYTESUR NEGATIVE 04/24/2021 1819   Sepsis Labs: @LABRCNTIP (procalcitonin:4,lacticidven:4) ) Recent Results (from the past 240 hour(s))  Resp Panel by RT-PCR (Flu A&B, Covid) Nasopharyngeal Swab     Status: None   Collection Time: 04/24/21  5:02 PM   Specimen: Nasopharyngeal Swab; Nasopharyngeal(NP) swabs in vial transport medium  Result Value Ref Range Status    SARS Coronavirus 2 by RT PCR NEGATIVE NEGATIVE Final    Comment: (NOTE) SARS-CoV-2 target nucleic acids are NOT DETECTED.  The SARS-CoV-2 RNA is generally detectable in upper respiratory specimens during the acute phase of infection. The lowest concentration of SARS-CoV-2 viral copies this assay can detect is 138 copies/mL. A negative result does not preclude SARS-Cov-2 infection and should not be used as the sole basis for treatment or other patient management decisions. A negative result may occur with  improper specimen collection/handling, submission of specimen other than nasopharyngeal swab, presence of viral mutation(s) within the areas targeted by this assay, and inadequate number of viral copies(<138 copies/mL). A negative result must be combined with clinical observations, patient history, and epidemiological information. The expected result is Negative.  Fact Sheet for Patients:  EntrepreneurPulse.com.au  Fact Sheet for Healthcare Providers:  IncredibleEmployment.be  This test is no t yet approved or cleared by the [place] FDA and  has been authorized for detection and/or diagnosis of SARS-CoV-2 by  FDA under an Emergency Use Authorization (EUA). This EUA will remain  in effect (meaning this test can be used) for the duration of the COVID-19 declaration under Section 564(b)(1) of the Act, 21 U.S.C.section 360bbb-3(b)(1), unless the authorization is terminated  or revoked sooner.       Influenza A by PCR NEGATIVE NEGATIVE Final   Influenza B by PCR NEGATIVE NEGATIVE Final    Comment: (NOTE) The Xpert Xpress SARS-CoV-2/FLU/RSV plus assay is intended as an aid in the diagnosis of influenza from Nasopharyngeal swab specimens and should not be used as a sole basis for treatment. Nasal washings and aspirates are unacceptable for Xpert Xpress SARS-CoV-2/FLU/RSV testing.  Fact Sheet for  Patients: EntrepreneurPulse.com.au  Fact Sheet for Healthcare Providers: IncredibleEmployment.be  This test is not yet approved or cleared by the [place] FDA and has been authorized for detection and/or diagnosis of SARS-CoV-2 by FDA under an Emergency Use Authorization (EUA). This EUA will remain in effect (meaning this test can be used) for the duration of the COVID-19 declaration under Section 564(b)(1) of the Act, 21 U.S.C. section 360bbb-3(b)(1), unless the authorization is terminated or revoked.  Performed at [facility], [address] [place], [place] [address]   Blood Culture (routine x 2)     Status: None (Preliminary result)   Collection Time: 04/24/21  6:21 PM   Specimen: BLOOD LEFT HAND  Result Value Ref Range Status   Specimen Description   Final    BLOOD LEFT HAND Performed at [facility], [address] [place], [place] [address]    Special Requests   Final    BOTTLES DRAWN AEROBIC AND ANAEROBIC Blood Culture adequate volume Performed at [facility], [address] [place], [place] [address]    Culture   Final    NO GROWTH 2 DAYS Performed at [facility], [address] [place], [place] [address]    Report Status PENDING  Incomplete  Blood Culture (routine x 2)     Status: None (Preliminary result)   Collection Time: 04/24/21  6:21 PM   Specimen: Left Antecubital; Blood  Result Value Ref Range Status   Specimen Description   Final    LEFT ANTECUBITAL Performed at [facility], [address], [place], [place] [address]    Special Requests   Final    BOTTLES DRAWN AEROBIC AND ANAEROBIC Blood Culture adequate volume Performed at [facility], [address] [place], [place] [address]    Culture   Final    NO GROWTH 2 DAYS Performed at [facility], [address] [place],  [address]    Report Status PENDING  Incomplete     Scheduled Meds:  arformoterol  15 mcg  Nebulization BID   aspirin EC  81 mg Oral Daily   atorvastatin  10 mg Oral q1800   budesonide (PULMICORT) nebulizer solution  0.5 mg Nebulization BID   enoxaparin (LOVENOX) injection  50 mg Subcutaneous Q24H   furosemide  20 mg Intravenous Once   ipratropium-albuterol  3 mL Nebulization Q6H   losartan  25 mg Oral Daily   methylPREDNISolone (SOLU-MEDROL) injection  60 mg Intravenous Q12H   metoprolol tartrate  12.5 mg Oral BID   Continuous Infusions:  azithromycin 500 mg (04/25/21 1758)   cefTRIAXone (ROCEPHIN)  IV Stopped (04/24/21 2019)   cefTRIAXone (ROCEPHIN)  IV 2 g (04/25/21 1626)    Procedures/Studies: DG Chest 2 View  Result Date: 04/24/2021 CLINICAL DATA:  SOB EXAM: CHEST - 2  VIEW COMPARISON:  April 19, 2016 FINDINGS: The cardiomediastinal silhouette is unchanged in contour.Atherosclerotic calcifications. No pleural effusion. No pneumothorax. Diffuse reticular prominence with perihilar vascular congestion and peribronchial cuffing, mildly increased. Visualized abdomen is unremarkable. Multilevel degenerative changes of the thoracic spine. IMPRESSION: Constellation of findings are favored to reflect pulmonary edema. Atypical infection could present similarly. Electronically Signed   By: Valentino Saxon M.D.   On: 04/24/2021 15:58   DG Chest 2 View  Result Date: 04/19/2021 CLINICAL DATA:  Wheezing and shortness of breath following viral upper respiratory infection. EXAM: CHEST - 2 VIEW COMPARISON:  09/06/2019 FINDINGS: Coarse lung markings are chronic. No focal airspace disease or lung consolidation. Heart size is upper limits of normal and stable. Atherosclerotic calcifications at the aortic arch. No pleural effusions. No acute bone abnormality. IMPRESSION: Chronic lung changes without acute findings. Electronically Signed   By: Markus Daft M.D.   On: 04/19/2021 16:22    Orson Eva, DO  [facility]  If 7PM-7AM, please contact night-coverage [email or web address] Password  [email or web address] 04/26/2021, 9:01 AM   LOS: 2 days

## 2021-04-26 NOTE — ED Provider Notes (Signed)
[place]   [id number] 04/24/21 Arrival Time: 2751  ASSESSMENT & PLAN:  1. Hypoxia   2. COPD exacerbation ([place])    I have personally viewed the imaging studies ordered this visit. No specific infiltrate appreciated. See radiology report.  Give O2 requirement, she warrants a higher level of care. Rec ED evaluation. She agrees. EMS declined. Daughter to tranport via private vehicle. Current illness likely respiratory but cannot r/o early sepsis.  Recommend:  Follow-up Information     Go to  [place].   Specialty: Emergency Medicine Contact information: [address] [id number] [place] [phone]                Reviewed expectations re: course of current medical issues. Questions answered. Outlined signs and symptoms indicating need for more acute intervention. Patient verbalized understanding. After Visit Summary given.  SUBJECTIVE: History from: patient and caregiver.  Jessica Lewis is a 75 y.o. female who presents with complaint of SOB; past 1-2 weeks; seen here sev d ago. Given steroids. Now symptoms worse, esp SOB that is much worse with virtually any exertion. No spec CP. Afebrile. Tolerating PO intake. No abd or back pain. With LE edema, worse than usual.  Social History   Tobacco Use  Smoking Status Former   Packs/day: 1.50   Years: 15.00   Pack years: 22.50   Types: Cigarettes   Quit date: 1987   Years since quitting: 35.8  Smokeless Tobacco Never     OBJECTIVE:  Vitals:   04/24/21 1543 04/24/21 1544 04/24/21 1602 04/24/21 1602  BP:  (!) 152/79    Pulse:  (!) 111    Resp:    (!) 22  Temp:  99.1 F (37.3 C)    TempSrc:  Oral    SpO2:  (!) 88% 90%   Weight: 106.6 kg     Height: 5\' 2"  (1.575 m)        General appearance: alert; appears fatigued HEENT: [place]; AT; without significant nasal congestion Neck: supple without LAD CV: regular Lungs: with labored respirations,  coarse breath sounds bilaterally with rales; able to speak short sentences Skin: warm and dry Psychological: alert and cooperative; normal mood and affect  Imaging: DG Chest 2 View  Result Date: 04/24/2021 CLINICAL DATA:  SOB EXAM: CHEST - 2 VIEW COMPARISON:  April 19, 2016 FINDINGS: The cardiomediastinal silhouette is unchanged in contour.Atherosclerotic calcifications. No pleural effusion. No pneumothorax. Diffuse reticular prominence with perihilar vascular congestion and peribronchial cuffing, mildly increased. Visualized abdomen is unremarkable. Multilevel degenerative changes of the thoracic spine. IMPRESSION: Constellation of findings are favored to reflect pulmonary edema. Atypical infection could present similarly. Electronically Signed   By: Valentino Saxon M.D.   On: 04/24/2021 15:58    Allergies  Allergen Reactions   Codeine Nausea And Vomiting   Tetracyclines & Related Rash   Claritin [Loratadine] Hives    Past Medical History:  Diagnosis Date   Allergic rhinitis due to pollen    Bronchitis    Chronic obstructive pulmonary disease, unspecified ([place])    Cystocele 10/14/2015   Headache(784.0)    Menstral migraines   Osteoporosis    Pneumonia due to COVID-19 virus    PONV (postoperative nausea and vomiting)    Rectocele 11/16/2012   Rosacea    Family History  Problem Relation Age of Onset   Stroke Mother    Social History   Socioeconomic History   Marital status: Married    Spouse name:  Not on file   Number of children: Not on file   Years of education: Not on file   Highest education level: Not on file  Occupational History   Not on file  Tobacco Use   Smoking status: Former    Packs/day: 1.50    Years: 15.00    Pack years: 22.50    Types: Cigarettes    Quit date: 37    Years since quitting: 35.8   Smokeless tobacco: Never  Vaping Use   Vaping Use: Never used  Substance and Sexual Activity   Alcohol use: No   Drug use: No   Sexual  activity: Not Currently    Birth control/protection: Post-menopausal, Surgical    Comment: tubal  Other Topics Concern   Not on file  Social History Narrative   Not on file   Social Determinants of Health   Financial Resource Strain: Not on file  Food Insecurity: Not on file  Transportation Needs: Not on file  Physical Activity: Not on file  Stress: Not on file  Social Connections: Not on file  Intimate Partner Violence: Not on file             [place], MD 04/26/21 [phone]

## 2021-04-26 NOTE — Progress Notes (Signed)
*  PRELIMINARY RESULTS* Echocardiogram 2D Echocardiogram has been performed.  Elpidio Anis 04/26/2021, 2:14 PM

## 2021-04-26 NOTE — Plan of Care (Signed)

## 2021-04-27 LAB — CBC
HCT: 37.4 % (ref 36.0–46.0)
Hemoglobin: 12 g/dL (ref 12.0–15.0)
MCH: 29.9 pg (ref 26.0–34.0)
MCHC: 32.1 g/dL (ref 30.0–36.0)
MCV: 93.3 fL (ref 80.0–100.0)
Platelets: 423 10*3/uL — ABNORMAL HIGH (ref 150–400)
RBC: 4.01 MIL/uL (ref 3.87–5.11)
RDW: 15.9 % — ABNORMAL HIGH (ref 11.5–15.5)
WBC: 15.4 10*3/uL — ABNORMAL HIGH (ref 4.0–10.5)
nRBC: 0 % (ref 0.0–0.2)

## 2021-04-27 LAB — BASIC METABOLIC PANEL
Anion gap: 7 (ref 5–15)
BUN: 18 mg/dL (ref 8–23)
CO2: 34 mmol/L — ABNORMAL HIGH (ref 22–32)
Calcium: 8.2 mg/dL — ABNORMAL LOW (ref 8.9–10.3)
Chloride: 98 mmol/L (ref 98–111)
Creatinine, Ser: 0.49 mg/dL (ref 0.44–1.00)
GFR, Estimated: >60 mL/min (ref >60)
Glucose, Bld: 144 mg/dL — ABNORMAL HIGH (ref 70–99)
Potassium: 4.8 mmol/L (ref 3.5–5.1)
Sodium: 139 mmol/L (ref 135–145)

## 2021-04-27 MED ORDER — CEFDINIR 300 MG PO CAPS
300.0000 mg | ORAL_CAPSULE | Freq: Two times a day (BID) | ORAL | Status: DC
  Administered 2021-04-27: 300 mg via ORAL
  Filled 2021-04-27: qty 1

## 2021-04-27 MED ORDER — IPRATROPIUM-ALBUTEROL 0.5-2.5 (3) MG/3ML IN SOLN: 3.0000 mL | Freq: Two times a day (BID) | RESPIRATORY_TRACT | Status: DC

## 2021-04-27 MED ORDER — AZITHROMYCIN 500 MG PO TABS: 500.0000 mg | ORAL_TABLET | Freq: Every day | ORAL | 0 refills | Status: DC

## 2021-04-27 MED ORDER — CEFDINIR 300 MG PO CAPS: 300.0000 mg | ORAL_CAPSULE | Freq: Two times a day (BID) | ORAL | 0 refills | Status: DC

## 2021-04-27 MED ORDER — AZITHROMYCIN 250 MG PO TABS
500.0000 mg | ORAL_TABLET | Freq: Every day | ORAL | Status: DC
  Administered 2021-04-27: 500 mg via ORAL
  Filled 2021-04-27: qty 2

## 2021-04-27 MED ORDER — PREDNISONE 10 MG PO TABS: 60.0000 mg | ORAL_TABLET | Freq: Every day | ORAL | 0 refills | Status: DC

## 2021-04-27 MED ORDER — PREDNISONE 20 MG PO TABS: 60.0000 mg | ORAL_TABLET | Freq: Every day | ORAL | Status: DC

## 2021-04-27 NOTE — TOC Transition Note (Signed)
Transition of Care [facility]) - CM/SW Discharge Note   Patient Details  Name: Jessica Lewis MRN: [id number] Date of Birth: 1945-12-07  [facility] CM/SW Contact:  Shade Flood, LCSW Phone Number: 04/27/2021, 11:40 AM   Clinical Narrative:     Pt stable for dc today per MD. Pt requires Home O2 setup for dc. Spoke with pt to review dc planning and O2 CMS provider options. Pt requests [facility] for the Home O2. Referral given to [facility]. Faxed requested clinical. They will deliver portable tank and arrange for home setup for dc today.  There are no other TOC needs identified for dc.  Expected Discharge Plan: Home/Self Care Barriers to Discharge: Barriers Resolved   Patient Goals and CMS Choice Patient states their goals for this hospitalization and ongoing recovery are:: go home CMS Medicare.gov Compare Post Acute Care list provided to:: Patient Choice offered to / list presented to : Patient  Expected Discharge Plan and Services Expected Discharge Plan: Home/Self Care In-house Referral: Clinical Social Work   Post Acute Care Choice: Durable Medical Equipment Living arrangements for the past 2 months: Single Family Home Expected Discharge Date: 04/27/21               DME Arranged: Oxygen DME Agency: [facility] Date DME Agency Contacted: 04/27/21                Prior Living Arrangements/Services Living arrangements for the past 2 months: Single Family Home Lives with:: Spouse Patient language and need for interpreter reviewed:: Yes Do you feel safe going back to the place where you live?: Yes      Need for Family Participation in Patient Care: No (Comment) Care giver support system in place?: Yes (comment)   Criminal Activity/Legal Involvement Pertinent to Current Situation/Hospitalization: No - Comment as needed  Activities of Daily Living Home Assistive Devices/Equipment: None ADL Screening (condition at time of  admission) Patient's cognitive ability adequate to safely complete daily activities?: Yes Is the patient deaf or have difficulty hearing?: No Does the patient have difficulty seeing, even when wearing glasses/contacts?: Yes (has glasses) Does the patient have difficulty concentrating, remembering, or making decisions?: No Patient able to express need for assistance with ADLs?: Yes Does the patient have difficulty dressing or bathing?: No Independently performs ADLs?: Yes (appropriate for developmental age) Does the patient have difficulty walking or climbing stairs?: No Weakness of Legs: None Weakness of Arms/Hands: None  Permission Sought/Granted Permission sought to share information with : Facility Art therapist granted to share information with : Yes, Verbal Permission Granted     Permission granted to share info w AGENCY: [facility]        Emotional Assessment   Attitude/Demeanor/Rapport: Engaged Affect (typically observed): Pleasant Orientation: : Oriented to Self, Oriented to Place, Oriented to  Time, Oriented to Situation Alcohol / Substance Use: Not Applicable Psych Involvement: No (comment)  Admission diagnosis:  Acute respiratory failure with hypoxia (HCC) [J96.01] Sepsis, due to unspecified organism, unspecified whether acute organ dysfunction present [facility]) [A41.9] Patient Active Problem List   Diagnosis Date Noted   Obesity, Class III, BMI 40-49.9 (morbid obesity) ([place]) 04/25/2021   Sepsis ([place])    CAP (community acquired pneumonia) 04/24/2021   COPD with acute exacerbation ([place]) 04/24/2021   Vitamin D deficiency 10/28/2019   Screening for colorectal cancer 10/22/2019   Routine cervical smear 10/22/2019   Echocardiogram abnormal 08/03/2019   Hyperkalemia 08/03/2019   Leukocytosis 08/03/2019   NSTEMI (non-ST elevated  myocardial infarction) ([place]) 07/20/2019   Dyslipidemia 07/20/2019   Acute respiratory failure with hypoxia ([place])  07/17/2019   Pneumonia due to COVID-19 virus 07/17/2019   Elevated troponin 07/17/2019   Age-related osteoporosis without current pathological fracture    Allergic rhinitis due to pollen    COPD GOLD III     Obesity, unspecified    Other chest pain    Shortness of breath    Osteoporosis    Cystocele without uterine prolapse 10/17/2016   Cystocele 10/14/2015   Osteopenia 11/16/2012   Rectocele 11/16/2012   Migraines 11/15/2012   PCP:  Celene Squibb, MD Pharmacy:   [facility] [address] [place] [address] Phone: [phone] Fax: [phone]     Social Determinants of Health (SDOH) Interventions    Readmission Risk Interventions No flowsheet data found.   Final next level of care: Home/Self Care Barriers to Discharge: Barriers Resolved   Patient Goals and CMS Choice Patient states their goals for this hospitalization and ongoing recovery are:: go home CMS Medicare.gov Compare Post Acute Care list provided to:: Patient Choice offered to / list presented to : Patient  Discharge Placement                       Discharge Plan and Services In-house Referral: Clinical Social Work   Post Acute Care Choice: Durable Medical Equipment          DME Arranged: Oxygen DME Agency: [facility] Date DME Agency Contacted: 04/27/21                Social Determinants of Health ([place]) Interventions     Readmission Risk Interventions No flowsheet data found.

## 2021-04-27 NOTE — Care Management Important Message (Signed)
Important Message  Patient Details  Name: Jessica Lewis MRN: [id number] Date of Birth: February 13, 1946   Medicare Important Message Given:  Yes     Tommy Medal 04/27/2021, 12:05 PM

## 2021-04-27 NOTE — Progress Notes (Signed)
SATURATION QUALIFICATIONS: (This note is used to comply with regulatory documentation for home oxygen)  Patient Saturations on Room Air at Rest = 89%  Patient Saturations on Room Air while Ambulating = 87%  Patient Saturations on 2 Liters of oxygen while Ambulating = 96%  Please briefly explain why patient needs home oxygen:

## 2021-04-27 NOTE — Discharge Summary (Signed)
Physician Discharge Summary  MARKEA RUZICH ZOX:[id number] DOB: 11/30/1945 DOA: 04/24/2021  PCP: Celene Squibb, MD  Admit date: 04/24/2021 Discharge date: 04/27/2021  Admitted From: Home Disposition:  Home   Recommendations for Outpatient Follow-up:  Follow up with PCP in 1-2 weeks Please obtain BMP/CBC in one week Please follow up on the following pending results:   Equipment/Devices: 2L [place]  Discharge Condition: Stable CODE STATUS:FULL Diet recommendation: Heart Healthy   Brief/Interim Summary: 75 year old female with a history of coronary artery disease, hyperlipidemia, COPD, migraine headache, diastolic CHF presenting with at least 10-day history of coughing with yellow sputum and shortness of breath.  The patient went to urgent care on 04/19/2021.  She was diagnosed with bronchitis and given a prednisone taper.  She had some initial improvement, but over the past 2 days prior to this admission she had worsening shortness of breath, dyspnea on exertion, and persistent coughing with green sputum.  She did complain of a low-grade fever at home up to 100.5 F.  She went back to urgent care on the day of admission, and she was noted to have oxygen saturation in the 80s.  She was directed to the emergency department for further evaluation.  She quit smoking in 1987 after a 10-pack-year history.  She denies any headache, neck pain, chest pain, hemoptysis, nausea, vomiting, diarrhea, abdominal pain, dysuria, hematuria. In the emergency department, patient was hemodynamically stable but hypoxic with oxygen saturation of 90% on 2 L.  She had low-grade temperature of 99.1 F.  BMP showed sodium 134, potassium 4.2, serum creatinine 0.69.  WBC 22.6, hemoglobin 13.0, platelets 437,000.  Lactic acid was 1.3.  VBG showed pH 7.44 and PCO2 43.  The patient was started on IV Solu-Medrol, ceftriaxone, and azithromycin.  Discharge Diagnoses:  Sepsis -Present on admission -Secondary to  pneumonia -Presented with leukocytosis, tachycardia, and tachypnea -Lactic acid peaked 1.3 -Check PCT <0.10 -Continue ceftriaxone and azithromycin -blood cultures neg -sepsis physiology resolved   Acute respiratory failure with hypoxia -Secondary to pneumonia and COPD exacerbation -Presented with oxygen saturation in the 80s on room air with tachypnea and increased work of breathing -Stable on 2 L -Wean oxygen for saturation greater 92% -ambulatory pulse ox on day of d/c drops to 87% -d/c home with 2L   Pneumonia -Personally reviewed chest x-ray--increase interstitial prominence with scatteredinterstitial opacities -Continue ceftriaxone and azithromycin -d/c home with 4 more days cefdinir and azithro   COPD exacerbation -continue pulmicort -continue duoneb -continue brovana -IV solumedrol>>d/c home with prednisone taper   Acute on Chronic diastolic CHF -continue IV lasix -09/19/19 Echo--EF 55-60, G1DD, no WMA, mild MR -04/26/21 Echo EF 70-75%, no WMA, mild MR   Coronary artery disease -No chest pain presently -Continue metoprolol titrate   Essential hypertension -Continue metoprolol tartrate and losartan   Hyperlipidemia -Continue statin   Morbid obesity -BMI 41.67 -Lifestyle modification    Discharge Instructions   Allergies as of 04/27/2021       Reactions   Codeine Nausea And Vomiting   Tetracyclines & Related Rash   Claritin [loratadine] Hives        Medication List     STOP taking these medications    predniSONE 10 MG (21) Tbpk tablet Commonly known as: STERAPRED UNI-PAK 21 TAB Replaced by: predniSONE 10 MG tablet   Promethazine HCl 6.25 MG/5ML Soln       TAKE these medications    acetaminophen 325 MG tablet Commonly known as: TYLENOL Take 650 mg by mouth every 6 (  six) hours as needed.   albuterol 108 (90 Base) MCG/ACT inhaler Commonly known as: VENTOLIN HFA Inhale 1 puff into the lungs every 6 (six) hours as needed for wheezing or  shortness of breath.   aspirin EC 81 MG tablet Take 81 mg by mouth daily.   atorvastatin 20 MG tablet Commonly known as: LIPITOR Take 0.5 tablets (10 mg total) by mouth daily at 6 PM.   azithromycin 500 MG tablet Commonly known as: ZITHROMAX Take 1 tablet (500 mg total) by mouth daily.   calcium carbonate 1250 (500 Ca) MG tablet Commonly known as: OS-CAL - dosed in mg of elemental calcium Take 1,000 mg by mouth daily.   cefdinir 300 MG capsule Commonly known as: OMNICEF Take 1 capsule (300 mg total) by mouth every 12 (twelve) hours.   losartan 25 MG tablet Commonly known as: COZAAR TAKE 1 TABLET BY MOUTH ONCE DAILY.   metoprolol tartrate 25 MG tablet Commonly known as: LOPRESSOR TAKE 1/2 TABLET BY MOUTH TWICE DAILY.   ONE-A-DAY WOMENS PETITES PO Take 1 tablet by mouth daily.   predniSONE 10 MG tablet Commonly known as: DELTASONE Take 6 tablets (60 mg total) by mouth daily with breakfast. And decrease by one tablet daily Start taking on: April 28, 2021 Replaces: predniSONE 10 MG (21) Tbpk tablet   Trelegy Ellipta 100-62.5-25 MCG/ACT Aepb Generic drug: Fluticasone-Umeclidin-Vilant Inhale 1 puff into the lungs daily.   Vitamin C 500 MG Chew Chew 500 mg by mouth daily.   Vitamin D3 50 MCG (2000 UT) Tabs Take 2,000 Units by mouth daily.   zinc gluconate 50 MG tablet Take 50 mg by mouth daily.               Durable Medical Equipment  (From admission, onward)           Start     Ordered   04/27/21 1130  For home use only DME oxygen  Once       Question Answer Comment  Length of Need 6 Months   Mode or (Route) Nasal cannula   Liters per Minute 2   Frequency Continuous (stationary and portable oxygen unit needed)   Oxygen conserving device Yes   Oxygen delivery system Gas      04/27/21 1129            Allergies  Allergen Reactions   Codeine Nausea And Vomiting   Tetracyclines & Related Rash   Claritin [Loratadine] Hives     Consultations: none   Procedures/Studies: DG Chest 2 View  Result Date: 04/24/2021 CLINICAL DATA:  SOB EXAM: CHEST - 2 VIEW COMPARISON:  April 19, 2016 FINDINGS: The cardiomediastinal silhouette is unchanged in contour.Atherosclerotic calcifications. No pleural effusion. No pneumothorax. Diffuse reticular prominence with perihilar vascular congestion and peribronchial cuffing, mildly increased. Visualized abdomen is unremarkable. Multilevel degenerative changes of the thoracic spine. IMPRESSION: Constellation of findings are favored to reflect pulmonary edema. Atypical infection could present similarly. Electronically Signed   By: Valentino Saxon M.D.   On: 04/24/2021 15:58   DG Chest 2 View  Result Date: 04/19/2021 CLINICAL DATA:  Wheezing and shortness of breath following viral upper respiratory infection. EXAM: CHEST - 2 VIEW COMPARISON:  09/06/2019 FINDINGS: Coarse lung markings are chronic. No focal airspace disease or lung consolidation. Heart size is upper limits of normal and stable. Atherosclerotic calcifications at the aortic arch. No pleural effusions. No acute bone abnormality. IMPRESSION: Chronic lung changes without acute findings. Electronically Signed   By: Markus Daft  M.D.   On: 04/19/2021 16:22   ECHOCARDIOGRAM COMPLETE  Result Date: 04/26/2021    ECHOCARDIOGRAM REPORT   Patient Name:   LENOR PROVENCHER Date of Exam: 04/26/2021 Medical Rec #:  [id number]     Height:       62.0 in Accession #:    [id number]    Weight:       227.8 lb Date of Birth:  11-15-1945     BSA:          2.020 m Patient Age:    75 years      BP:           152/73 mmHg Patient Gender: F             HR:           82 bpm. Exam Location:  [facility] Procedure: 2D Echo, Cardiac Doppler and Color Doppler Indications:    CHF-Acute Diastolic  History:        Patient has prior history of Echocardiogram examinations, most                 recent 09/19/2019. Previous Myocardial Infarction, COPD,                  Signs/Symptoms:Chest Pain and Shortness of Breath; Risk                 Factors:Dyslipidemia. Morbid Obesity.  Sonographer:    Wenda Low Referring Phys: [phone] Emmilynn Marut IMPRESSIONS  1. Left ventricular ejection fraction, by estimation, is 70 to 75%. The left ventricle has normal function. The left ventricle has no regional wall motion abnormalities. There is mild left ventricular hypertrophy. Left ventricular diastolic parameters were normal.  2. Right ventricular systolic function is normal. The right ventricular size is normal.  3. The mitral valve is normal in structure. Mild mitral valve regurgitation.  4. The aortic valve is tricuspid. Aortic valve regurgitation is not visualized.  5. The inferior vena cava is normal in size with greater than 50% respiratory variability, suggesting right atrial pressure of 3 mmHg. Comparison(s): The left ventricular function is unchanged. FINDINGS  Left Ventricle: Left ventricular ejection fraction, by estimation, is 70 to 75%. The left ventricle has normal function. The left ventricle has no regional wall motion abnormalities. The left ventricular internal cavity size was normal in size. There is  mild left ventricular hypertrophy. Left ventricular diastolic parameters were normal. Right Ventricle: The right ventricular size is normal. Right vetricular wall thickness was not assessed. Right ventricular systolic function is normal. Left Atrium: Left atrial size was normal in size. Right Atrium: Right atrial size was normal in size. Pericardium: There is no evidence of pericardial effusion. Mitral Valve: The mitral valve is normal in structure. Mild mitral valve regurgitation. MV peak gradient, 3.5 mmHg. The mean mitral valve gradient is 2.0 mmHg. Tricuspid Valve: The tricuspid valve is normal in structure. Tricuspid valve regurgitation is not demonstrated. Aortic Valve: The aortic valve is tricuspid. Aortic valve regurgitation is not visualized. Aortic valve mean gradient  measures 4.0 mmHg. Aortic valve peak gradient measures 8.9 mmHg. Aortic valve area, by VTI measures 3.24 cm. Pulmonic Valve: The pulmonic valve was normal in structure. Pulmonic valve regurgitation is trivial. Aorta: The aortic root and ascending aorta are structurally normal, with no evidence of dilitation. Venous: The inferior vena cava is normal in size with greater than 50% respiratory variability, suggesting right atrial pressure of 3 mmHg. IAS/Shunts: No atrial level shunt detected by color  flow Doppler.  LEFT VENTRICLE PLAX 2D LVIDd:         5.30 cm     Diastology LVIDs:         2.80 cm     LV e' medial:    8.59 cm/s LV PW:         0.90 cm     LV E/e' medial:  11.0 LV IVS:        1.40 cm     LV e' lateral:   9.36 cm/s LVOT diam:     2.00 cm     LV E/e' lateral: 10.1 LV SV:         94 LV SV Index:   46 LVOT Area:     3.14 cm  LV Volumes (MOD) LV vol d, MOD A2C: 82.0 ml LV vol d, MOD A4C: 81.9 ml LV vol s, MOD A2C: 37.5 ml LV vol s, MOD A4C: 26.4 ml LV SV MOD A2C:     44.5 ml LV SV MOD A4C:     81.9 ml LV SV MOD BP:      49.5 ml RIGHT VENTRICLE RV Basal diam:  3.50 cm RV Mid diam:    2.80 cm RV S prime:     19.30 cm/s TAPSE (M-mode): 3.1 cm LEFT ATRIUM             Index        RIGHT ATRIUM           Index LA diam:        4.00 cm 1.98 cm/m   RA Area:     13.40 cm LA Vol (A2C):   63.9 ml 31.63 ml/m  RA Volume:   29.40 ml  14.55 ml/m LA Vol (A4C):   63.4 ml 31.38 ml/m LA Biplane Vol: 65.7 ml 32.52 ml/m  AORTIC VALVE                    PULMONIC VALVE AV Area (Vmax):    2.89 cm     PV Vmax:       0.99 m/s AV Area (Vmean):   2.88 cm     PV Peak grad:  3.9 mmHg AV Area (VTI):     3.24 cm AV Vmax:           149.00 cm/s AV Vmean:          94.900 cm/s AV VTI:            0.289 m AV Peak Grad:      8.9 mmHg AV Mean Grad:      4.0 mmHg LVOT Vmax:         137.00 cm/s LVOT Vmean:        86.900 cm/s LVOT VTI:          0.298 m LVOT/AV VTI ratio: 1.03  AORTA Ao Root diam: 3.60 cm MITRAL VALVE MV Area (PHT): 4.49 cm     SHUNTS MV Area VTI:   3.76 cm    Systemic VTI:  0.30 m MV Peak grad:  3.5 mmHg    Systemic Diam: 2.00 cm MV Mean grad:  2.0 mmHg MV Vmax:       0.94 m/s MV Vmean:      57.9 cm/s MV Decel Time: 169 msec MV E velocity: 94.30 cm/s MV A velocity: 79.30 cm/s MV E/A ratio:  1.19 Dorris Carnes MD Electronically signed by Dorris Carnes MD Signature Date/Time: 04/26/2021/8:02:13 PM    Final  Discharge Exam: Vitals:   04/27/21 0704 04/27/21 0904  BP:  (!) 154/50  Pulse:  (!) 101  Resp:  16  Temp:  98.2 F (36.8 C)  SpO2: 96% 96%   Vitals:   04/26/21 2032 04/27/21 0537 04/27/21 0704 04/27/21 0904  BP: (!) 136/54 (!) 146/58  (!) 154/50  Pulse: 97 78  (!) 101  Resp: 16 13  16   Temp: 98.3 F (36.8 C) 97.7 F (36.5 C)  98.2 F (36.8 C)  TempSrc: Oral Oral  Oral  SpO2: 93% 94% 96% 96%  Weight:      Height:        General: Pt is alert, awake, not in acute distress Cardiovascular: RRR, S1/S2 +, no rubs, no gallops Respiratory: bibasilar rales. No wheeze Abdominal: Soft, NT, ND, bowel sounds + Extremities: trace LE edema, no cyanosis   The results of significant diagnostics from this hospitalization (including imaging, microbiology, ancillary and laboratory) are listed below for reference.    Significant Diagnostic Studies: DG Chest 2 View  Result Date: 04/24/2021 CLINICAL DATA:  SOB EXAM: CHEST - 2 VIEW COMPARISON:  April 19, 2016 FINDINGS: The cardiomediastinal silhouette is unchanged in contour.Atherosclerotic calcifications. No pleural effusion. No pneumothorax. Diffuse reticular prominence with perihilar vascular congestion and peribronchial cuffing, mildly increased. Visualized abdomen is unremarkable. Multilevel degenerative changes of the thoracic spine. IMPRESSION: Constellation of findings are favored to reflect pulmonary edema. Atypical infection could present similarly. Electronically Signed   By: Valentino Saxon M.D.   On: 04/24/2021 15:58   DG Chest 2 View  Result  Date: 04/19/2021 CLINICAL DATA:  Wheezing and shortness of breath following viral upper respiratory infection. EXAM: CHEST - 2 VIEW COMPARISON:  09/06/2019 FINDINGS: Coarse lung markings are chronic. No focal airspace disease or lung consolidation. Heart size is upper limits of normal and stable. Atherosclerotic calcifications at the aortic arch. No pleural effusions. No acute bone abnormality. IMPRESSION: Chronic lung changes without acute findings. Electronically Signed   By: Markus Daft M.D.   On: 04/19/2021 16:22   ECHOCARDIOGRAM COMPLETE  Result Date: 04/26/2021    ECHOCARDIOGRAM REPORT   Patient Name:   TOPAZ RAGLIN Date of Exam: 04/26/2021 Medical Rec #:  [id number]     Height:       62.0 in Accession #:    [id number]    Weight:       227.8 lb Date of Birth:  July 14, 1945     BSA:          2.020 m Patient Age:    4 years      BP:           152/73 mmHg Patient Gender: F             HR:           82 bpm. Exam Location:  [facility] Procedure: 2D Echo, Cardiac Doppler and Color Doppler Indications:    CHF-Acute Diastolic  History:        Patient has prior history of Echocardiogram examinations, most                 recent 09/19/2019. Previous Myocardial Infarction, COPD,                 Signs/Symptoms:Chest Pain and Shortness of Breath; Risk                 Factors:Dyslipidemia. Morbid Obesity.  Sonographer:    Wenda Low Referring Phys: [phone] Kwanza Cancelliere IMPRESSIONS  1.  Left ventricular ejection fraction, by estimation, is 70 to 75%. The left ventricle has normal function. The left ventricle has no regional wall motion abnormalities. There is mild left ventricular hypertrophy. Left ventricular diastolic parameters were normal.  2. Right ventricular systolic function is normal. The right ventricular size is normal.  3. The mitral valve is normal in structure. Mild mitral valve regurgitation.  4. The aortic valve is tricuspid. Aortic valve regurgitation is not visualized.  5. The inferior vena cava is normal  in size with greater than 50% respiratory variability, suggesting right atrial pressure of 3 mmHg. Comparison(s): The left ventricular function is unchanged. FINDINGS  Left Ventricle: Left ventricular ejection fraction, by estimation, is 70 to 75%. The left ventricle has normal function. The left ventricle has no regional wall motion abnormalities. The left ventricular internal cavity size was normal in size. There is  mild left ventricular hypertrophy. Left ventricular diastolic parameters were normal. Right Ventricle: The right ventricular size is normal. Right vetricular wall thickness was not assessed. Right ventricular systolic function is normal. Left Atrium: Left atrial size was normal in size. Right Atrium: Right atrial size was normal in size. Pericardium: There is no evidence of pericardial effusion. Mitral Valve: The mitral valve is normal in structure. Mild mitral valve regurgitation. MV peak gradient, 3.5 mmHg. The mean mitral valve gradient is 2.0 mmHg. Tricuspid Valve: The tricuspid valve is normal in structure. Tricuspid valve regurgitation is not demonstrated. Aortic Valve: The aortic valve is tricuspid. Aortic valve regurgitation is not visualized. Aortic valve mean gradient measures 4.0 mmHg. Aortic valve peak gradient measures 8.9 mmHg. Aortic valve area, by VTI measures 3.24 cm. Pulmonic Valve: The pulmonic valve was normal in structure. Pulmonic valve regurgitation is trivial. Aorta: The aortic root and ascending aorta are structurally normal, with no evidence of dilitation. Venous: The inferior vena cava is normal in size with greater than 50% respiratory variability, suggesting right atrial pressure of 3 mmHg. IAS/Shunts: No atrial level shunt detected by color flow Doppler.  LEFT VENTRICLE PLAX 2D LVIDd:         5.30 cm     Diastology LVIDs:         2.80 cm     LV e' medial:    8.59 cm/s LV PW:         0.90 cm     LV E/e' medial:  11.0 LV IVS:        1.40 cm     LV e' lateral:   9.36 cm/s  LVOT diam:     2.00 cm     LV E/e' lateral: 10.1 LV SV:         94 LV SV Index:   46 LVOT Area:     3.14 cm  LV Volumes (MOD) LV vol d, MOD A2C: 82.0 ml LV vol d, MOD A4C: 81.9 ml LV vol s, MOD A2C: 37.5 ml LV vol s, MOD A4C: 26.4 ml LV SV MOD A2C:     44.5 ml LV SV MOD A4C:     81.9 ml LV SV MOD BP:      49.5 ml RIGHT VENTRICLE RV Basal diam:  3.50 cm RV Mid diam:    2.80 cm RV S prime:     19.30 cm/s TAPSE (M-mode): 3.1 cm LEFT ATRIUM             Index        RIGHT ATRIUM           Index  LA diam:        4.00 cm 1.98 cm/m   RA Area:     13.40 cm LA Vol (A2C):   63.9 ml 31.63 ml/m  RA Volume:   29.40 ml  14.55 ml/m LA Vol (A4C):   63.4 ml 31.38 ml/m LA Biplane Vol: 65.7 ml 32.52 ml/m  AORTIC VALVE                    PULMONIC VALVE AV Area (Vmax):    2.89 cm     PV Vmax:       0.99 m/s AV Area (Vmean):   2.88 cm     PV Peak grad:  3.9 mmHg AV Area (VTI):     3.24 cm AV Vmax:           149.00 cm/s AV Vmean:          94.900 cm/s AV VTI:            0.289 m AV Peak Grad:      8.9 mmHg AV Mean Grad:      4.0 mmHg LVOT Vmax:         137.00 cm/s LVOT Vmean:        86.900 cm/s LVOT VTI:          0.298 m LVOT/AV VTI ratio: 1.03  AORTA Ao Root diam: 3.60 cm MITRAL VALVE MV Area (PHT): 4.49 cm    SHUNTS MV Area VTI:   3.76 cm    Systemic VTI:  0.30 m MV Peak grad:  3.5 mmHg    Systemic Diam: 2.00 cm MV Mean grad:  2.0 mmHg MV Vmax:       0.94 m/s MV Vmean:      57.9 cm/s MV Decel Time: 169 msec MV E velocity: 94.30 cm/s MV A velocity: 79.30 cm/s MV E/A ratio:  1.19 Dorris Carnes MD Electronically signed by Dorris Carnes MD Signature Date/Time: 04/26/2021/8:02:13 PM    Final     Microbiology: Recent Results (from the past 240 hour(s))  Resp Panel by RT-PCR (Flu A&B, Covid) Nasopharyngeal Swab     Status: None   Collection Time: 04/24/21  5:02 PM   Specimen: Nasopharyngeal Swab; Nasopharyngeal(NP) swabs in vial transport medium  Result Value Ref Range Status   SARS Coronavirus 2 by RT PCR NEGATIVE NEGATIVE Final     Comment: (NOTE) SARS-CoV-2 target nucleic acids are NOT DETECTED.  The SARS-CoV-2 RNA is generally detectable in upper respiratory specimens during the acute phase of infection. The lowest concentration of SARS-CoV-2 viral copies this assay can detect is 138 copies/mL. A negative result does not preclude SARS-Cov-2 infection and should not be used as the sole basis for treatment or other patient management decisions. A negative result may occur with  improper specimen collection/handling, submission of specimen other than nasopharyngeal swab, presence of viral mutation(s) within the areas targeted by this assay, and inadequate number of viral copies(<138 copies/mL). A negative result must be combined with clinical observations, patient history, and epidemiological information. The expected result is Negative.  Fact Sheet for Patients:  EntrepreneurPulse.com.au  Fact Sheet for Healthcare Providers:  IncredibleEmployment.be  This test is no t yet approved or cleared by the [place] FDA and  has been authorized for detection and/or diagnosis of SARS-CoV-2 by FDA under an Emergency Use Authorization (EUA). This EUA will remain  in effect (meaning this test can be used) for the duration of the COVID-19 declaration under Section 564(b)(1) of the  Act, 21 U.S.C.section 360bbb-3(b)(1), unless the authorization is terminated  or revoked sooner.       Influenza A by PCR NEGATIVE NEGATIVE Final   Influenza B by PCR NEGATIVE NEGATIVE Final    Comment: (NOTE) The Xpert Xpress SARS-CoV-2/FLU/RSV plus assay is intended as an aid in the diagnosis of influenza from Nasopharyngeal swab specimens and should not be used as a sole basis for treatment. Nasal washings and aspirates are unacceptable for Xpert Xpress SARS-CoV-2/FLU/RSV testing.  Fact Sheet for Patients: EntrepreneurPulse.com.au  Fact Sheet for Healthcare  Providers: IncredibleEmployment.be  This test is not yet approved or cleared by the [place] FDA and has been authorized for detection and/or diagnosis of SARS-CoV-2 by FDA under an Emergency Use Authorization (EUA). This EUA will remain in effect (meaning this test can be used) for the duration of the COVID-19 declaration under Section 564(b)(1) of the Act, 21 U.S.C. section 360bbb-3(b)(1), unless the authorization is terminated or revoked.  Performed at [facility], [address], [place], [place] [address]   Blood Culture (routine x 2)     Status: None (Preliminary result)   Collection Time: 04/24/21  6:21 PM   Specimen: BLOOD LEFT HAND  Result Value Ref Range Status   Specimen Description   Final    BLOOD LEFT HAND Performed at [facility], [address], [place], [place] [address]    Special Requests   Final    BOTTLES DRAWN AEROBIC AND ANAEROBIC Blood Culture adequate volume Performed at [facility], [address] [place], [place] [address]    Culture   Final    NO GROWTH 3 DAYS Performed at [facility], [address], [place], [place] [address]    Report Status PENDING  Incomplete  Blood Culture (routine x 2)     Status: None (Preliminary result)   Collection Time: 04/24/21  6:21 PM   Specimen: Left Antecubital; Blood  Result Value Ref Range Status   Specimen Description   Final    LEFT ANTECUBITAL Performed at [facility], [address], [place], [place] [address]    Special Requests   Final    BOTTLES DRAWN AEROBIC AND ANAEROBIC Blood Culture adequate volume Performed at [facility], [address], [place], [place] [address]    Culture   Final    NO GROWTH 3 DAYS Performed at [facility] [address], [place],  [address]    Report Status PENDING  Incomplete     Labs: Basic Metabolic Panel: Recent Labs  Lab 04/24/21 1630 04/25/21 0430 04/26/21 0357 04/27/21 0525  NA 134* 139 138  139  K 4.2 4.1 4.8 4.8  CL 100 102 100 98  CO2 27 30 31  34*  GLUCOSE 188* 106* 168* 144*  BUN 18 15 15 18   CREATININE 0.69 0.63 0.61 0.49  CALCIUM 8.2* 8.1* 8.3* 8.2*  MG  --   --  2.0  --    Liver Function Tests: No results for input(s): AST, ALT, ALKPHOS, BILITOT, PROT, ALBUMIN in the last 168 hours. No results for input(s): LIPASE, AMYLASE in the last 168 hours. No results for input(s): AMMONIA in the last 168 hours. CBC: Recent Labs  Lab 04/24/21 1630 04/25/21 0430 04/26/21 0357 04/27/21 0525  WBC 22.6* 17.0* 14.3* 15.4*  NEUTROABS 20.3*  --   --   --   HGB 13.0 11.2* 12.1 12.0  HCT 39.6 34.8* 37.6 37.4  MCV 91.5 92.3 92.6 93.3  PLT 437* 367 395  423*   Cardiac Enzymes: No results for input(s): CKTOTAL, CKMB, CKMBINDEX, TROPONINI in the last 168 hours. BNP: Invalid input(s): POCBNP CBG: No results for input(s): GLUCAP in the last 168 hours.  Time coordinating discharge:  36 minutes  Signed:  Orson Eva, DO [facility] Pager: [phone] 04/27/2021, 1:46 PM

## 2021-04-29 LAB — CULTURE, BLOOD (ROUTINE X 2)
Culture: NO GROWTH
Culture: NO GROWTH
Special Requests: ADEQUATE
Special Requests: ADEQUATE

## 2021-05-03 ENCOUNTER — Other Ambulatory Visit: Payer: Self-pay | Admitting: *Deleted

## 2021-05-03 NOTE — Patient Outreach (Signed)
[facility]) Care Management  05/03/2021  Jessica Lewis 1946-04-30 [id number]  Initial telephone outreach for [facility].  Spoke with [name], who reports she is improving PAC dx pneumonia. She has all meds, has finished her antibiotic and steroid Rxs. She has an appt with Dr. Nevada Crane on Friday.  [facility] services explained and pt feels that she is well supported and does not need the service at this time. Her daughter is a Marine scientist.  She agreed to receive a letter and brochure for future needs.  [name] Myrtie Neither, MSN, [facility] Gerontological Nurse Practitioner [facility] Care Management [phone]

## 2021-05-07 DIAGNOSIS — B3731 Acute candidiasis of vulva and vagina: Secondary | ICD-10-CM | POA: Diagnosis not present

## 2021-05-07 DIAGNOSIS — R0600 Dyspnea, unspecified: Secondary | ICD-10-CM | POA: Diagnosis not present

## 2021-05-07 DIAGNOSIS — J441 Chronic obstructive pulmonary disease with (acute) exacerbation: Secondary | ICD-10-CM | POA: Diagnosis not present

## 2021-05-11 DIAGNOSIS — M79674 Pain in right toe(s): Secondary | ICD-10-CM | POA: Diagnosis not present

## 2021-05-11 DIAGNOSIS — M79675 Pain in left toe(s): Secondary | ICD-10-CM | POA: Diagnosis not present

## 2021-05-11 DIAGNOSIS — L110 Acquired keratosis follicularis: Secondary | ICD-10-CM | POA: Diagnosis not present

## 2021-05-11 DIAGNOSIS — M79671 Pain in right foot: Secondary | ICD-10-CM | POA: Diagnosis not present

## 2021-05-11 DIAGNOSIS — I739 Peripheral vascular disease, unspecified: Secondary | ICD-10-CM | POA: Diagnosis not present

## 2021-05-11 DIAGNOSIS — M79672 Pain in left foot: Secondary | ICD-10-CM | POA: Diagnosis not present

## 2021-05-11 NOTE — Progress Notes (Signed)
Cardiology Office Note  Date: 05/12/2021   ID: Illeana, Edick [birth year], MRN [id number]  PCP:  Celene Squibb, MD  Cardiologist:  Rozann Lesches, MD Electrophysiologist:  None   Chief Complaint  Patient presents with   Cardiac follow-up     History of Present Illness: Jessica Lewis is a 75 y.o. female last seen in April.  She is here with her husband for a follow-up visit.  I reviewed interval records. She was recently hospitalized with acute hypoxic respiratory failure in the setting of COPD exacerbation and pneumonia.  She did have a follow-up echocardiogram during her hospital stay which showed vigorous LVEF of 70 to 75% as outlined below.  Since discharge she has felt progressively better.  She was sent home with supplemental oxygen but has been weaning this off and following her oxygen saturation at home.  Reports no cough, fevers or chills.    Blood pressure was elevated today, however I reviewed her home blood pressure checks which look quite good, generally systolics in the 361W.  I reviewed her medications which are stable from a cardiac perspective and outlined below.  Past Medical History:  Diagnosis Date   Allergic rhinitis due to pollen    Bronchitis    Chronic obstructive pulmonary disease, unspecified ([place])    Cystocele 10/14/2015   Headache(784.0)    Menstral migraines   Osteoporosis    Pneumonia due to COVID-19 virus    PONV (postoperative nausea and vomiting)    Rectocele 11/16/2012   Rosacea     Past Surgical History:  Procedure Laterality Date   CHOLECYSTECTOMY     COLONOSCOPY  02/09/2011   Procedure: COLONOSCOPY;  Surgeon: Rogene Houston, MD;  [facility];  Service: Endoscopy;  Laterality: N/A;   COLONOSCOPY WITH PROPOFOL N/A 12/18/2020   Procedure: COLONOSCOPY WITH PROPOFOL;  Surgeon: Harvel Quale, MD;  [facility];  Service: Gastroenterology;  Laterality: N/A;  8:15   POLYPECTOMY  12/18/2020   Procedure:  POLYPECTOMY INTESTINAL;  Surgeon: Montez Morita, Quillian Quince, MD;  [facility];  Service: Gastroenterology;;   TUBAL LIGATION Bilateral     Current Outpatient Medications  Medication Sig Dispense Refill   acetaminophen (TYLENOL) 325 MG tablet Take 650 mg by mouth every 6 (six) hours as needed.     albuterol (VENTOLIN HFA) 108 (90 Base) MCG/ACT inhaler Inhale 1 puff into the lungs every 6 (six) hours as needed for wheezing or shortness of breath.     Ascorbic Acid (VITAMIN C) 500 MG CHEW Chew 500 mg by mouth daily.     aspirin EC 81 MG tablet Take 81 mg by mouth daily.     atorvastatin (LIPITOR) 20 MG tablet Take 0.5 tablets (10 mg total) by mouth daily at 6 PM. 45 tablet 3   calcium carbonate (OS-CAL - DOSED IN MG OF ELEMENTAL CALCIUM) 1250 (500 Ca) MG tablet Take 1,000 mg by mouth daily.     Cholecalciferol (VITAMIN D3) 50 MCG (2000 UT) TABS Take 2,000 Units by mouth daily.     Fluticasone-Umeclidin-Vilant (TRELEGY ELLIPTA) 100-62.5-25 MCG/INH AEPB Inhale 1 puff into the lungs daily.     losartan (COZAAR) 25 MG tablet TAKE 1 TABLET BY MOUTH ONCE DAILY. 90 tablet 0   metoprolol tartrate (LOPRESSOR) 25 MG tablet TAKE 1/2 TABLET BY MOUTH TWICE DAILY. (Patient taking differently: Take 12.5 mg by mouth 2 (two) times daily.) 90 tablet 0   Multiple Vitamins-Minerals (ONE-A-DAY WOMENS PETITES PO) Take  1 tablet by mouth daily.     zinc gluconate 50 MG tablet Take 50 mg by mouth daily.     No current facility-administered medications for this visit.   Allergies:  Codeine, Tetracyclines & related, and Claritin [loratadine]   ROS: No palpitations or syncope.  Physical Exam: VS:  BP (!) 154/64   Pulse 88   Ht 5\' 2"  (1.575 m)   Wt 225 lb (102.1 kg)   SpO2 97%   BMI 41.15 kg/m , BMI Body mass index is 41.15 kg/m.  Wt Readings from Last 3 Encounters:  05/12/21 225 lb (102.1 kg)  04/24/21 227 lb 12.8 oz (103.3 kg)  04/24/21 235 lb (106.6 kg)    General: Patient appears comfortable  at rest. HEENT: Conjunctiva and lids normal, wearing a mask. Neck: Supple, no elevated JVP or carotid bruits, no thyromegaly. Lungs: Crackles at the left base, no wheezing, nonlabored breathing at rest. Cardiac: Regular rate and rhythm, no S3, 1/6 systolic murmur. Extremities: No pitting edema.  ECG:  An ECG dated 04/24/2021 was personally reviewed today and demonstrated:  Sinus tachycardia with left atrial enlargement.  Recent Labwork: 04/24/2021: B Natriuretic Peptide 128.0 04/26/2021: Magnesium 2.0 04/27/2021: BUN 18; Creatinine, Ser 0.49; Hemoglobin 12.0; Platelets 423; Potassium 4.8; Sodium 139     Component Value Date/Time   CHOL 118 07/22/2019 1415   TRIG 184 (H) 07/22/2019 1415   HDL 20 (L) 07/22/2019 1415   CHOLHDL 5.9 07/22/2019 1415   VLDL 37 07/22/2019 1415   LDLCALC 61 07/22/2019 1415    Other Studies Reviewed Today:  Echocardiogram 04/26/2021:  1. Left ventricular ejection fraction, by estimation, is 70 to 75%. The  left ventricle has normal function. The left ventricle has no regional  wall motion abnormalities. There is mild left ventricular hypertrophy.  Left ventricular diastolic parameters  were normal.   2. Right ventricular systolic function is normal. The right ventricular  size is normal.   3. The mitral valve is normal in structure. Mild mitral valve  regurgitation.   4. The aortic valve is tricuspid. Aortic valve regurgitation is not  visualized.   5. The inferior vena cava is normal in size with greater than 50%  respiratory variability, suggesting right atrial pressure of 3 mmHg.   Comparison(s): The left ventricular function is unchanged.   Assessment and Plan:  1.  History of cardiomyopathy with suspected myocarditis in the setting of prior COVID-19 pneumonia.  LVEF subsequently normalized and was vigorous by recent follow-up echocardiogram at 70 to 75%.  She is symptomatically stable.  No clear indication to modify current cardiac regimen.   Continue Lopressor and losartan.  2.  Essential hypertension and whitecoat hypertension.  I reviewed her home blood pressure measurements which look well controlled with systolics in the 366Y.  No change to current regimen.  3.  Aortic atherosclerosis, asymptomatic and on Lipitor.  Medication Adjustments/Labs and Tests Ordered: Current medicines are reviewed at length with the patient today.  Concerns regarding medicines are outlined above.   Tests Ordered: No orders of the defined types were placed in this encounter.   Medication Changes: No orders of the defined types were placed in this encounter.   Disposition:  Follow up  6 months.  Signed, Satira Sark, MD, [facility] 05/12/2021 10:02 AM    [facility] [place], [place], [place] [address] Phone: [phone]; Fax: [phone]

## 2021-05-12 ENCOUNTER — Encounter: Payer: Self-pay | Admitting: Cardiology

## 2021-05-12 ENCOUNTER — Ambulatory Visit: Payer: Medicare Other | Admitting: Cardiology

## 2021-05-12 VITALS — BP 154/64 | HR 88 | Ht 62.0 in | Wt 225.0 lb

## 2021-05-12 DIAGNOSIS — Z8679 Personal history of other diseases of the circulatory system: Secondary | ICD-10-CM | POA: Diagnosis not present

## 2021-05-12 DIAGNOSIS — I700 Atherosclerosis of aorta: Secondary | ICD-10-CM | POA: Diagnosis not present

## 2021-05-12 DIAGNOSIS — I10 Essential (primary) hypertension: Secondary | ICD-10-CM | POA: Diagnosis not present

## 2021-05-12 NOTE — Patient Instructions (Addendum)

## 2021-07-07 DIAGNOSIS — J449 Chronic obstructive pulmonary disease, unspecified: Secondary | ICD-10-CM | POA: Diagnosis not present

## 2021-07-07 DIAGNOSIS — B370 Candidal stomatitis: Secondary | ICD-10-CM | POA: Diagnosis not present

## 2021-07-07 DIAGNOSIS — U071 COVID-19: Secondary | ICD-10-CM | POA: Diagnosis not present

## 2021-07-07 DIAGNOSIS — J029 Acute pharyngitis, unspecified: Secondary | ICD-10-CM | POA: Diagnosis not present

## 2021-07-07 DIAGNOSIS — H9201 Otalgia, right ear: Secondary | ICD-10-CM | POA: Diagnosis not present

## 2021-07-07 DIAGNOSIS — R509 Fever, unspecified: Secondary | ICD-10-CM | POA: Diagnosis not present

## 2021-07-13 ENCOUNTER — Other Ambulatory Visit: Payer: Self-pay | Admitting: Cardiology

## 2021-07-15 DIAGNOSIS — M79671 Pain in right foot: Secondary | ICD-10-CM | POA: Diagnosis not present

## 2021-07-15 DIAGNOSIS — M79675 Pain in left toe(s): Secondary | ICD-10-CM | POA: Diagnosis not present

## 2021-07-15 DIAGNOSIS — L110 Acquired keratosis follicularis: Secondary | ICD-10-CM | POA: Diagnosis not present

## 2021-07-15 DIAGNOSIS — I739 Peripheral vascular disease, unspecified: Secondary | ICD-10-CM | POA: Diagnosis not present

## 2021-07-15 DIAGNOSIS — M79674 Pain in right toe(s): Secondary | ICD-10-CM | POA: Diagnosis not present

## 2021-07-15 DIAGNOSIS — M79672 Pain in left foot: Secondary | ICD-10-CM | POA: Diagnosis not present

## 2021-07-26 DIAGNOSIS — R42 Dizziness and giddiness: Secondary | ICD-10-CM | POA: Diagnosis not present

## 2021-09-09 DIAGNOSIS — M79674 Pain in right toe(s): Secondary | ICD-10-CM | POA: Diagnosis not present

## 2021-09-09 DIAGNOSIS — Q828 Other specified congenital malformations of skin: Secondary | ICD-10-CM | POA: Diagnosis not present

## 2021-09-09 DIAGNOSIS — M79672 Pain in left foot: Secondary | ICD-10-CM | POA: Diagnosis not present

## 2021-09-09 DIAGNOSIS — M79675 Pain in left toe(s): Secondary | ICD-10-CM | POA: Diagnosis not present

## 2021-09-09 DIAGNOSIS — L110 Acquired keratosis follicularis: Secondary | ICD-10-CM | POA: Diagnosis not present

## 2021-09-09 DIAGNOSIS — M79671 Pain in right foot: Secondary | ICD-10-CM | POA: Diagnosis not present

## 2021-09-27 DIAGNOSIS — I10 Essential (primary) hypertension: Secondary | ICD-10-CM | POA: Diagnosis not present

## 2021-09-27 DIAGNOSIS — E1169 Type 2 diabetes mellitus with other specified complication: Secondary | ICD-10-CM | POA: Diagnosis not present

## 2021-09-30 DIAGNOSIS — E1169 Type 2 diabetes mellitus with other specified complication: Secondary | ICD-10-CM | POA: Diagnosis not present

## 2021-09-30 DIAGNOSIS — R0600 Dyspnea, unspecified: Secondary | ICD-10-CM | POA: Diagnosis not present

## 2021-09-30 DIAGNOSIS — E782 Mixed hyperlipidemia: Secondary | ICD-10-CM | POA: Diagnosis not present

## 2021-09-30 DIAGNOSIS — I700 Atherosclerosis of aorta: Secondary | ICD-10-CM | POA: Diagnosis not present

## 2021-09-30 DIAGNOSIS — Z8673 Personal history of transient ischemic attack (TIA), and cerebral infarction without residual deficits: Secondary | ICD-10-CM | POA: Diagnosis not present

## 2021-09-30 DIAGNOSIS — G2581 Restless legs syndrome: Secondary | ICD-10-CM | POA: Diagnosis not present

## 2021-09-30 DIAGNOSIS — I10 Essential (primary) hypertension: Secondary | ICD-10-CM | POA: Diagnosis not present

## 2021-09-30 DIAGNOSIS — R945 Abnormal results of liver function studies: Secondary | ICD-10-CM | POA: Diagnosis not present

## 2021-09-30 DIAGNOSIS — M1990 Unspecified osteoarthritis, unspecified site: Secondary | ICD-10-CM | POA: Diagnosis not present

## 2021-11-18 DIAGNOSIS — M79675 Pain in left toe(s): Secondary | ICD-10-CM | POA: Diagnosis not present

## 2021-11-18 DIAGNOSIS — M79671 Pain in right foot: Secondary | ICD-10-CM | POA: Diagnosis not present

## 2021-11-18 DIAGNOSIS — M79674 Pain in right toe(s): Secondary | ICD-10-CM | POA: Diagnosis not present

## 2021-11-18 DIAGNOSIS — M79672 Pain in left foot: Secondary | ICD-10-CM | POA: Diagnosis not present

## 2021-11-18 DIAGNOSIS — S93331D Other subluxation of right foot, subsequent encounter: Secondary | ICD-10-CM | POA: Diagnosis not present

## 2021-11-18 DIAGNOSIS — S93332D Other subluxation of left foot, subsequent encounter: Secondary | ICD-10-CM | POA: Diagnosis not present

## 2022-01-05 ENCOUNTER — Other Ambulatory Visit (HOSPITAL_COMMUNITY): Payer: Self-pay | Admitting: Internal Medicine

## 2022-01-05 ENCOUNTER — Encounter: Payer: Self-pay | Admitting: Cardiology

## 2022-01-05 DIAGNOSIS — Z1231 Encounter for screening mammogram for malignant neoplasm of breast: Secondary | ICD-10-CM

## 2022-01-05 DIAGNOSIS — I10 Essential (primary) hypertension: Secondary | ICD-10-CM | POA: Diagnosis not present

## 2022-01-05 DIAGNOSIS — E1169 Type 2 diabetes mellitus with other specified complication: Secondary | ICD-10-CM | POA: Diagnosis not present

## 2022-01-10 DIAGNOSIS — R945 Abnormal results of liver function studies: Secondary | ICD-10-CM | POA: Diagnosis not present

## 2022-01-10 DIAGNOSIS — R0600 Dyspnea, unspecified: Secondary | ICD-10-CM | POA: Diagnosis not present

## 2022-01-10 DIAGNOSIS — M1990 Unspecified osteoarthritis, unspecified site: Secondary | ICD-10-CM | POA: Diagnosis not present

## 2022-01-10 DIAGNOSIS — I700 Atherosclerosis of aorta: Secondary | ICD-10-CM | POA: Diagnosis not present

## 2022-01-10 DIAGNOSIS — I10 Essential (primary) hypertension: Secondary | ICD-10-CM | POA: Diagnosis not present

## 2022-01-10 DIAGNOSIS — E1169 Type 2 diabetes mellitus with other specified complication: Secondary | ICD-10-CM | POA: Diagnosis not present

## 2022-01-10 DIAGNOSIS — Z8673 Personal history of transient ischemic attack (TIA), and cerebral infarction without residual deficits: Secondary | ICD-10-CM | POA: Diagnosis not present

## 2022-01-10 DIAGNOSIS — G2581 Restless legs syndrome: Secondary | ICD-10-CM | POA: Diagnosis not present

## 2022-01-10 DIAGNOSIS — E782 Mixed hyperlipidemia: Secondary | ICD-10-CM | POA: Diagnosis not present

## 2022-02-03 ENCOUNTER — Ambulatory Visit (HOSPITAL_COMMUNITY)
Admission: RE | Admit: 2022-02-03 | Discharge: 2022-02-03 | Disposition: A | Discharge status: Home / Self Care | Payer: Medicare Other | Source: Ambulatory Visit | Attending: Internal Medicine | Admitting: Internal Medicine

## 2022-02-03 DIAGNOSIS — Z1231 Encounter for screening mammogram for malignant neoplasm of breast: Secondary | ICD-10-CM | POA: Insufficient documentation

## 2022-02-10 DIAGNOSIS — M79674 Pain in right toe(s): Secondary | ICD-10-CM | POA: Diagnosis not present

## 2022-02-10 DIAGNOSIS — M79675 Pain in left toe(s): Secondary | ICD-10-CM | POA: Diagnosis not present

## 2022-02-10 DIAGNOSIS — I739 Peripheral vascular disease, unspecified: Secondary | ICD-10-CM | POA: Diagnosis not present

## 2022-02-10 DIAGNOSIS — M79671 Pain in right foot: Secondary | ICD-10-CM | POA: Diagnosis not present

## 2022-02-10 DIAGNOSIS — L110 Acquired keratosis follicularis: Secondary | ICD-10-CM | POA: Diagnosis not present

## 2022-02-10 DIAGNOSIS — M79672 Pain in left foot: Secondary | ICD-10-CM | POA: Diagnosis not present

## 2022-03-10 ENCOUNTER — Encounter: Payer: Self-pay | Admitting: Cardiology

## 2022-03-10 ENCOUNTER — Ambulatory Visit: Payer: Medicare Other | Attending: Cardiology | Admitting: Cardiology

## 2022-03-10 VITALS — BP 158/68 | HR 80 | Ht 63.0 in | Wt 238.8 lb

## 2022-03-10 DIAGNOSIS — Z8679 Personal history of other diseases of the circulatory system: Secondary | ICD-10-CM

## 2022-03-10 DIAGNOSIS — I10 Essential (primary) hypertension: Secondary | ICD-10-CM

## 2022-03-10 DIAGNOSIS — I700 Atherosclerosis of aorta: Secondary | ICD-10-CM

## 2022-03-10 NOTE — Progress Notes (Signed)
Cardiology Office Note  Date: 03/10/2022   ID: Jessica Lewis 1945-09-13, MRN [id number]  PCP:  Jessica Squibb, MD  Cardiologist:  Jessica Lesches, MD Electrophysiologist:  None   Chief Complaint  Patient presents with   Cardiac follow-up    History of Present Illness: Jessica Lewis is a 77 y.o. female last seen in November 2022.  She is here today with her husband for a follow-up visit.  She does not report any major change in health since last encounter, no hospitalizations.  She reports NYHA class II dyspnea.  Has been having some trouble with arthritic pains.  I reviewed her medications which are noted below.  She does not report any intolerances.  Home blood pressures are generally controlled with systolics in the 607P to 710G at the highest.  Today's measurement was higher in the office.  We have discussed continuing her present medications realizing that losartan could be further uptitrated if necessary.  I reviewed her recent follow-up lab work per PCP, noted below.  Today's ECG shows normal sinus rhythm.  Past Medical History:  Diagnosis Date   Allergic rhinitis due to pollen    Bronchitis    Chronic obstructive pulmonary disease, unspecified ([place])    Cystocele 10/14/2015   Headache(784.0)    Menstral migraines   Osteoporosis    Pneumonia due to COVID-19 virus    PONV (postoperative nausea and vomiting)    Rectocele 11/16/2012   Rosacea     Past Surgical History:  Procedure Laterality Date   CHOLECYSTECTOMY     COLONOSCOPY  02/09/2011   Procedure: COLONOSCOPY;  Surgeon: Jessica Houston, MD;  [facility];  Service: Endoscopy;  Laterality: N/A;   COLONOSCOPY WITH PROPOFOL N/A 12/18/2020   Procedure: COLONOSCOPY WITH PROPOFOL;  Surgeon: Jessica Quale, MD;  [facility];  Service: Gastroenterology;  Laterality: N/A;  8:15   POLYPECTOMY  12/18/2020   Procedure: POLYPECTOMY INTESTINAL;  Surgeon: Jessica Lewis, Jessica Quince, MD;   [facility];  Service: Gastroenterology;;   TUBAL LIGATION Bilateral     Current Outpatient Medications  Medication Sig Dispense Refill   acetaminophen (TYLENOL) 325 MG tablet Take 650 mg by mouth every 6 (six) hours as needed.     albuterol (VENTOLIN HFA) 108 (90 Base) MCG/ACT inhaler Inhale 1 puff into the lungs every 6 (six) hours as needed for wheezing or shortness of breath.     Ascorbic Acid (VITAMIN C) 500 MG CHEW Chew 500 mg by mouth daily.     aspirin EC 81 MG tablet Take 81 mg by mouth daily.     atorvastatin (LIPITOR) 20 MG tablet Take 0.5 tablets (10 mg total) by mouth daily at 6 PM. 45 tablet 3   calcium carbonate (OS-CAL - DOSED IN MG OF ELEMENTAL CALCIUM) 1250 (500 Ca) MG tablet Take 1,000 mg by mouth daily.     cetirizine (ZYRTEC) 10 MG tablet Take 10 mg by mouth at bedtime.     Cholecalciferol (VITAMIN D3) 50 MCG (2000 UT) TABS Take 2,000 Units by mouth daily.     Fluticasone-Umeclidin-Vilant (TRELEGY ELLIPTA) 100-62.5-25 MCG/INH AEPB Inhale 1 puff into the lungs daily.     losartan (COZAAR) 25 MG tablet TAKE 1 TABLET BY MOUTH ONCE DAILY. 90 tablet 3   metoprolol tartrate (LOPRESSOR) 25 MG tablet TAKE 1/2 TABLET BY MOUTH TWICE DAILY. (Patient taking differently: Take 12.5 mg by mouth 2 (two) times daily.) 90 tablet 0   Multiple Vitamins-Minerals (  ONE-A-DAY WOMENS PETITES PO) Take 1 tablet by mouth daily.     Omega-3 Fatty Acids (FISH OIL PO) Take 1 capsule by mouth 2 (two) times daily.     No current facility-administered medications for this visit.   Allergies:  Codeine, Tetracyclines & related, and Claritin [loratadine]   ROS: No palpitations or syncope.  Physical Exam: VS:  BP (!) 158/68   Pulse 80   Ht '5\' 3"'$  (1.6 m)   Wt 238 lb 12.8 oz (108.3 kg)   SpO2 95%   BMI 42.30 kg/m , BMI Body mass index is 42.3 kg/m.  Wt Readings from Last 3 Encounters:  03/10/22 238 lb 12.8 oz (108.3 kg)  05/12/21 225 lb (102.1 kg)  04/24/21 227 lb 12.8 oz (103.3 kg)     General: Patient appears comfortable at rest. HEENT: Conjunctiva and lids normal. Neck: Supple, no elevated JVP or carotid bruits, no thyromegaly. Lungs: Clear to auscultation, nonlabored breathing at rest. Cardiac: Regular rate and rhythm, no S3, 1/6 systolic murmur. Extremities: No pitting edema.  ECG:  An ECG dated 04/24/2021 was personally reviewed today and demonstrated:  Sinus tachycardia with left atrial enlargement.  Recent Labwork: 04/24/2021: B Natriuretic Peptide 128.0 04/26/2021: Magnesium 2.0 04/27/2021: BUN 18; Creatinine, Ser 0.49; Hemoglobin 12.0; Platelets 423; Potassium 4.8; Sodium 139     Component Value Date/Time   CHOL 118 07/22/2019 1415   TRIG 184 (H) 07/22/2019 1415   HDL 20 (L) 07/22/2019 1415   CHOLHDL 5.9 07/22/2019 1415   VLDL 37 07/22/2019 1415   LDLCALC 61 07/22/2019 1415  July 2023: Hemoglobin 13.5, platelets 283, BUN 16, creatinine 0.65, potassium 4.4, AST 19, ALT 15, cholesterol 164, triglycerides 99, HDL 54, LDL 92, hemoglobin A1c 5.4%  Other Studies Reviewed Today:  Echocardiogram 04/26/2021:  1. Left ventricular ejection fraction, by estimation, is 70 to 75%. The  left ventricle has normal function. The left ventricle has no regional  wall motion abnormalities. There is mild left ventricular hypertrophy.  Left ventricular diastolic parameters  were normal.   2. Right ventricular systolic function is normal. The right ventricular  size is normal.   3. The mitral valve is normal in structure. Mild mitral valve  regurgitation.   4. The aortic valve is tricuspid. Aortic valve regurgitation is not  visualized.   5. The inferior vena cava is normal in size with greater than 50%  respiratory variability, suggesting right atrial pressure of 3 mmHg.   Comparison(s): The left ventricular function is unchanged.   Assessment and Plan:  1.  History of myocarditis in the setting of COVID-19 pneumonia with subsequent normalization of LVEF.   Echocardiogram from October 2022 revealed LVEF 70 to 75% range.  She remains clinically stable with NYHA class II dyspnea.  ECG reviewed and normal.  Would continue losartan and Lopressor for now.  2.  Essential hypertension with whitecoat hypertension.  Home blood pressure shows reasonable blood pressure control on present regimen, no changes were made today.  3.  Asymptomatic aortic atherosclerosis evident by CT imaging.  She remains on Lipitor.  Medication Adjustments/Labs and Tests Ordered: Current medicines are reviewed at length with the patient today.  Concerns regarding medicines are outlined above.   Tests Ordered: Orders Placed This Encounter  Procedures   EKG 12-Lead    Medication Changes: No orders of the defined types were placed in this encounter.   Disposition:  Follow up  6 months.  Signed, Satira Sark, MD, [facility] 03/10/2022 2:31 PM  [facility] [place], [address], [place] [address] Phone: [phone]; Fax: [phone]

## 2022-03-10 NOTE — Patient Instructions (Signed)

## 2022-03-23 DIAGNOSIS — J029 Acute pharyngitis, unspecified: Secondary | ICD-10-CM | POA: Diagnosis not present

## 2022-03-23 DIAGNOSIS — R6889 Other general symptoms and signs: Secondary | ICD-10-CM | POA: Diagnosis not present

## 2022-03-23 DIAGNOSIS — J441 Chronic obstructive pulmonary disease with (acute) exacerbation: Secondary | ICD-10-CM | POA: Diagnosis not present

## 2022-03-23 DIAGNOSIS — R509 Fever, unspecified: Secondary | ICD-10-CM | POA: Diagnosis not present

## 2022-03-23 DIAGNOSIS — M791 Myalgia, unspecified site: Secondary | ICD-10-CM | POA: Diagnosis not present

## 2022-03-25 DIAGNOSIS — J069 Acute upper respiratory infection, unspecified: Secondary | ICD-10-CM | POA: Diagnosis not present

## 2022-03-25 DIAGNOSIS — J449 Chronic obstructive pulmonary disease, unspecified: Secondary | ICD-10-CM | POA: Diagnosis not present

## 2022-04-14 DIAGNOSIS — M79674 Pain in right toe(s): Secondary | ICD-10-CM | POA: Diagnosis not present

## 2022-04-14 DIAGNOSIS — I739 Peripheral vascular disease, unspecified: Secondary | ICD-10-CM | POA: Diagnosis not present

## 2022-04-14 DIAGNOSIS — M79675 Pain in left toe(s): Secondary | ICD-10-CM | POA: Diagnosis not present

## 2022-04-14 DIAGNOSIS — M79672 Pain in left foot: Secondary | ICD-10-CM | POA: Diagnosis not present

## 2022-04-14 DIAGNOSIS — M79671 Pain in right foot: Secondary | ICD-10-CM | POA: Diagnosis not present

## 2022-04-14 DIAGNOSIS — L110 Acquired keratosis follicularis: Secondary | ICD-10-CM | POA: Diagnosis not present

## 2022-04-18 DIAGNOSIS — E1169 Type 2 diabetes mellitus with other specified complication: Secondary | ICD-10-CM | POA: Diagnosis not present

## 2022-04-18 DIAGNOSIS — I10 Essential (primary) hypertension: Secondary | ICD-10-CM | POA: Diagnosis not present

## 2022-04-18 DIAGNOSIS — M1990 Unspecified osteoarthritis, unspecified site: Secondary | ICD-10-CM | POA: Diagnosis not present

## 2022-04-22 DIAGNOSIS — Z23 Encounter for immunization: Secondary | ICD-10-CM | POA: Diagnosis not present

## 2022-04-22 DIAGNOSIS — I10 Essential (primary) hypertension: Secondary | ICD-10-CM | POA: Diagnosis not present

## 2022-04-22 DIAGNOSIS — E1165 Type 2 diabetes mellitus with hyperglycemia: Secondary | ICD-10-CM | POA: Diagnosis not present

## 2022-04-22 DIAGNOSIS — R945 Abnormal results of liver function studies: Secondary | ICD-10-CM | POA: Diagnosis not present

## 2022-04-22 DIAGNOSIS — I700 Atherosclerosis of aorta: Secondary | ICD-10-CM | POA: Diagnosis not present

## 2022-04-22 DIAGNOSIS — R0600 Dyspnea, unspecified: Secondary | ICD-10-CM | POA: Diagnosis not present

## 2022-04-22 DIAGNOSIS — E782 Mixed hyperlipidemia: Secondary | ICD-10-CM | POA: Diagnosis not present

## 2022-04-22 DIAGNOSIS — M1990 Unspecified osteoarthritis, unspecified site: Secondary | ICD-10-CM | POA: Diagnosis not present

## 2022-04-22 DIAGNOSIS — Z8673 Personal history of transient ischemic attack (TIA), and cerebral infarction without residual deficits: Secondary | ICD-10-CM | POA: Diagnosis not present

## 2022-06-23 DIAGNOSIS — I739 Peripheral vascular disease, unspecified: Secondary | ICD-10-CM | POA: Diagnosis not present

## 2022-06-23 DIAGNOSIS — M79672 Pain in left foot: Secondary | ICD-10-CM | POA: Diagnosis not present

## 2022-06-23 DIAGNOSIS — M79675 Pain in left toe(s): Secondary | ICD-10-CM | POA: Diagnosis not present

## 2022-06-23 DIAGNOSIS — M79674 Pain in right toe(s): Secondary | ICD-10-CM | POA: Diagnosis not present

## 2022-06-23 DIAGNOSIS — L110 Acquired keratosis follicularis: Secondary | ICD-10-CM | POA: Diagnosis not present

## 2022-06-23 DIAGNOSIS — M79671 Pain in right foot: Secondary | ICD-10-CM | POA: Diagnosis not present

## 2022-07-13 ENCOUNTER — Other Ambulatory Visit: Payer: Self-pay | Admitting: Cardiology

## 2022-08-18 DIAGNOSIS — I10 Essential (primary) hypertension: Secondary | ICD-10-CM | POA: Diagnosis not present

## 2022-08-18 DIAGNOSIS — M810 Age-related osteoporosis without current pathological fracture: Secondary | ICD-10-CM | POA: Diagnosis not present

## 2022-08-18 DIAGNOSIS — E1165 Type 2 diabetes mellitus with hyperglycemia: Secondary | ICD-10-CM | POA: Diagnosis not present

## 2022-08-18 DIAGNOSIS — M1990 Unspecified osteoarthritis, unspecified site: Secondary | ICD-10-CM | POA: Diagnosis not present

## 2022-08-24 DIAGNOSIS — I700 Atherosclerosis of aorta: Secondary | ICD-10-CM | POA: Diagnosis not present

## 2022-08-24 DIAGNOSIS — E1165 Type 2 diabetes mellitus with hyperglycemia: Secondary | ICD-10-CM | POA: Diagnosis not present

## 2022-08-24 DIAGNOSIS — Z8673 Personal history of transient ischemic attack (TIA), and cerebral infarction without residual deficits: Secondary | ICD-10-CM | POA: Diagnosis not present

## 2022-08-24 DIAGNOSIS — M19012 Primary osteoarthritis, left shoulder: Secondary | ICD-10-CM | POA: Diagnosis not present

## 2022-08-24 DIAGNOSIS — Z0001 Encounter for general adult medical examination with abnormal findings: Secondary | ICD-10-CM | POA: Diagnosis not present

## 2022-08-24 DIAGNOSIS — R945 Abnormal results of liver function studies: Secondary | ICD-10-CM | POA: Diagnosis not present

## 2022-08-24 DIAGNOSIS — I10 Essential (primary) hypertension: Secondary | ICD-10-CM | POA: Diagnosis not present

## 2022-08-24 DIAGNOSIS — E782 Mixed hyperlipidemia: Secondary | ICD-10-CM | POA: Diagnosis not present

## 2022-08-24 DIAGNOSIS — M19011 Primary osteoarthritis, right shoulder: Secondary | ICD-10-CM | POA: Diagnosis not present

## 2022-08-30 DIAGNOSIS — Z23 Encounter for immunization: Secondary | ICD-10-CM | POA: Diagnosis not present

## 2022-09-02 DIAGNOSIS — M79641 Pain in right hand: Secondary | ICD-10-CM | POA: Diagnosis not present

## 2022-09-02 DIAGNOSIS — T50905A Adverse effect of unspecified drugs, medicaments and biological substances, initial encounter: Secondary | ICD-10-CM | POA: Diagnosis not present

## 2022-09-02 DIAGNOSIS — M792 Neuralgia and neuritis, unspecified: Secondary | ICD-10-CM | POA: Diagnosis not present

## 2022-09-02 DIAGNOSIS — T50A95A Adverse effect of other bacterial vaccines, initial encounter: Secondary | ICD-10-CM | POA: Diagnosis not present

## 2022-09-08 DIAGNOSIS — I739 Peripheral vascular disease, unspecified: Secondary | ICD-10-CM | POA: Diagnosis not present

## 2022-09-08 DIAGNOSIS — M79671 Pain in right foot: Secondary | ICD-10-CM | POA: Diagnosis not present

## 2022-09-08 DIAGNOSIS — M79674 Pain in right toe(s): Secondary | ICD-10-CM | POA: Diagnosis not present

## 2022-09-08 DIAGNOSIS — L110 Acquired keratosis follicularis: Secondary | ICD-10-CM | POA: Diagnosis not present

## 2022-09-08 DIAGNOSIS — M79675 Pain in left toe(s): Secondary | ICD-10-CM | POA: Diagnosis not present

## 2022-09-08 DIAGNOSIS — M79672 Pain in left foot: Secondary | ICD-10-CM | POA: Diagnosis not present

## 2022-09-21 ENCOUNTER — Encounter: Payer: Self-pay | Admitting: Cardiology

## 2022-09-21 ENCOUNTER — Ambulatory Visit: Payer: Medicare Other | Attending: Cardiology | Admitting: Cardiology

## 2022-09-21 VITALS — BP 152/80 | HR 80 | Ht 63.0 in | Wt 236.4 lb

## 2022-09-21 DIAGNOSIS — I10 Essential (primary) hypertension: Secondary | ICD-10-CM

## 2022-09-21 DIAGNOSIS — Z8679 Personal history of other diseases of the circulatory system: Secondary | ICD-10-CM | POA: Diagnosis not present

## 2022-09-21 DIAGNOSIS — I700 Atherosclerosis of aorta: Secondary | ICD-10-CM | POA: Diagnosis not present

## 2022-09-21 NOTE — Patient Instructions (Addendum)

## 2022-09-21 NOTE — Progress Notes (Signed)
    Cardiology Office Note  Date: 09/21/2022   ID: Jessica Lewis 1946/05/11, MRN [id number]  History of Present Illness: Jessica Lewis is a 77 y.o. female last seen in September.  She is here today with her husband and daughter for follow-up visit.  Overall doing well from a cardiac perspective.  Reports no worsening shortness of breath with typical activities, no exertional chest pain, no palpitations or syncope.  I went over her medications today.  No changes from a cardiac perspective.  She did have recent lab work with PCP.  I went over her home blood pressure checks which look better, average systolic is in the [id number].  She does have a component of whitecoat hypertension.  Physical Exam: VS:  BP (!) 152/80   Pulse 80   Ht 5\' 3"  (1.6 m)   Wt 236 lb 6.4 oz (107.2 kg)   SpO2 95%   BMI 41.88 kg/m , BMI Body mass index is 41.88 kg/m.  Wt Readings from Last 3 Encounters:  09/21/22 236 lb 6.4 oz (107.2 kg)  03/10/22 238 lb 12.8 oz (108.3 kg)  05/12/21 225 lb (102.1 kg)    General: Patient appears comfortable at rest. HEENT: Conjunctiva and lids normal. Neck: Supple, no elevated JVP or carotid bruits. Lungs: Clear to auscultation, nonlabored breathing at rest. Cardiac: Regular rate and rhythm, no S3, 1/6 systolic murmur. Extremities: No pitting edema.  ECG:  An ECG dated 03/10/2022 was personally reviewed today and demonstrated:  Sinus rhythm.  Labwork:  February 2024: Hemoglobin 13.9, platelets 280, BUN 12, creatinine 0.71, potassium 4.9, AST 17, ALT 11, cholesterol 164, triglycerides 78, HDL 54, LDL 95  Other Studies Reviewed Today:  No interval cardiac testing for review today.  Assessment and Plan:  1.  HFrecEF with history of myocarditis in the setting of COVID-19 pneumonia and subsequent normalization of LVEF, 70 to 75% range by echocardiogram in October 2022.  She is symptomatically stable, no clear indication for repeat echocardiogram at this time.  Plan to  continue observation on present medical therapy including Lopressor and Cozaar.  2.  Essential hypertension with whitecoat hypertension.  She continues to track blood pressure at home.  3.  Aortic atherosclerosis by CT imaging.  Currently on Lipitor.  Disposition:  Follow up  6 months.  Signed, Satira Sark, M.D., F.A.C.C.

## 2022-11-17 DIAGNOSIS — M79675 Pain in left toe(s): Secondary | ICD-10-CM | POA: Diagnosis not present

## 2022-11-17 DIAGNOSIS — M79672 Pain in left foot: Secondary | ICD-10-CM | POA: Diagnosis not present

## 2022-11-17 DIAGNOSIS — M79674 Pain in right toe(s): Secondary | ICD-10-CM | POA: Diagnosis not present

## 2022-11-17 DIAGNOSIS — M79671 Pain in right foot: Secondary | ICD-10-CM | POA: Diagnosis not present

## 2022-11-17 DIAGNOSIS — L110 Acquired keratosis follicularis: Secondary | ICD-10-CM | POA: Diagnosis not present

## 2022-11-17 DIAGNOSIS — I739 Peripheral vascular disease, unspecified: Secondary | ICD-10-CM | POA: Diagnosis not present

## 2022-12-08 DIAGNOSIS — I10 Essential (primary) hypertension: Secondary | ICD-10-CM | POA: Diagnosis not present

## 2022-12-08 DIAGNOSIS — M1990 Unspecified osteoarthritis, unspecified site: Secondary | ICD-10-CM | POA: Diagnosis not present

## 2022-12-08 DIAGNOSIS — E1165 Type 2 diabetes mellitus with hyperglycemia: Secondary | ICD-10-CM | POA: Diagnosis not present

## 2022-12-08 DIAGNOSIS — M810 Age-related osteoporosis without current pathological fracture: Secondary | ICD-10-CM | POA: Diagnosis not present

## 2022-12-14 ENCOUNTER — Other Ambulatory Visit (HOSPITAL_COMMUNITY): Payer: Self-pay | Admitting: Family Medicine

## 2022-12-14 DIAGNOSIS — E782 Mixed hyperlipidemia: Secondary | ICD-10-CM | POA: Diagnosis not present

## 2022-12-14 DIAGNOSIS — G2581 Restless legs syndrome: Secondary | ICD-10-CM | POA: Diagnosis not present

## 2022-12-14 DIAGNOSIS — R945 Abnormal results of liver function studies: Secondary | ICD-10-CM | POA: Diagnosis not present

## 2022-12-14 DIAGNOSIS — Z8673 Personal history of transient ischemic attack (TIA), and cerebral infarction without residual deficits: Secondary | ICD-10-CM | POA: Diagnosis not present

## 2022-12-14 DIAGNOSIS — E1165 Type 2 diabetes mellitus with hyperglycemia: Secondary | ICD-10-CM | POA: Diagnosis not present

## 2022-12-14 DIAGNOSIS — M1990 Unspecified osteoarthritis, unspecified site: Secondary | ICD-10-CM | POA: Diagnosis not present

## 2022-12-14 DIAGNOSIS — Z1231 Encounter for screening mammogram for malignant neoplasm of breast: Secondary | ICD-10-CM

## 2022-12-14 DIAGNOSIS — R0600 Dyspnea, unspecified: Secondary | ICD-10-CM | POA: Diagnosis not present

## 2022-12-14 DIAGNOSIS — I10 Essential (primary) hypertension: Secondary | ICD-10-CM | POA: Diagnosis not present

## 2022-12-14 DIAGNOSIS — I700 Atherosclerosis of aorta: Secondary | ICD-10-CM | POA: Diagnosis not present

## 2022-12-14 DIAGNOSIS — M810 Age-related osteoporosis without current pathological fracture: Secondary | ICD-10-CM

## 2023-01-14 DIAGNOSIS — Z79899 Other long term (current) drug therapy: Secondary | ICD-10-CM | POA: Diagnosis not present

## 2023-01-14 DIAGNOSIS — R110 Nausea: Secondary | ICD-10-CM | POA: Diagnosis not present

## 2023-01-14 DIAGNOSIS — R42 Dizziness and giddiness: Secondary | ICD-10-CM | POA: Diagnosis not present

## 2023-01-14 DIAGNOSIS — Z87891 Personal history of nicotine dependence: Secondary | ICD-10-CM | POA: Diagnosis not present

## 2023-01-14 DIAGNOSIS — Z7951 Long term (current) use of inhaled steroids: Secondary | ICD-10-CM | POA: Diagnosis not present

## 2023-01-14 DIAGNOSIS — B370 Candidal stomatitis: Secondary | ICD-10-CM | POA: Diagnosis not present

## 2023-01-14 DIAGNOSIS — J449 Chronic obstructive pulmonary disease, unspecified: Secondary | ICD-10-CM | POA: Diagnosis not present

## 2023-01-26 DIAGNOSIS — L110 Acquired keratosis follicularis: Secondary | ICD-10-CM | POA: Diagnosis not present

## 2023-01-26 DIAGNOSIS — M79672 Pain in left foot: Secondary | ICD-10-CM | POA: Diagnosis not present

## 2023-01-26 DIAGNOSIS — M79671 Pain in right foot: Secondary | ICD-10-CM | POA: Diagnosis not present

## 2023-01-26 DIAGNOSIS — I739 Peripheral vascular disease, unspecified: Secondary | ICD-10-CM | POA: Diagnosis not present

## 2023-01-26 DIAGNOSIS — M79674 Pain in right toe(s): Secondary | ICD-10-CM | POA: Diagnosis not present

## 2023-01-26 DIAGNOSIS — M79675 Pain in left toe(s): Secondary | ICD-10-CM | POA: Diagnosis not present

## 2023-02-06 ENCOUNTER — Encounter (HOSPITAL_COMMUNITY): Payer: Self-pay

## 2023-02-06 ENCOUNTER — Ambulatory Visit (HOSPITAL_COMMUNITY)
Admission: RE | Admit: 2023-02-06 | Discharge: 2023-02-06 | Disposition: A | Discharge status: Home / Self Care | Payer: Medicare Other | Source: Ambulatory Visit | Attending: Family Medicine | Admitting: Family Medicine

## 2023-02-06 DIAGNOSIS — Z780 Asymptomatic menopausal state: Secondary | ICD-10-CM | POA: Diagnosis not present

## 2023-02-06 DIAGNOSIS — M810 Age-related osteoporosis without current pathological fracture: Secondary | ICD-10-CM | POA: Insufficient documentation

## 2023-02-06 DIAGNOSIS — Z1231 Encounter for screening mammogram for malignant neoplasm of breast: Secondary | ICD-10-CM | POA: Insufficient documentation

## 2023-03-20 DIAGNOSIS — E1165 Type 2 diabetes mellitus with hyperglycemia: Secondary | ICD-10-CM | POA: Diagnosis not present

## 2023-03-20 DIAGNOSIS — I10 Essential (primary) hypertension: Secondary | ICD-10-CM | POA: Diagnosis not present

## 2023-03-20 DIAGNOSIS — M810 Age-related osteoporosis without current pathological fracture: Secondary | ICD-10-CM | POA: Diagnosis not present

## 2023-03-20 DIAGNOSIS — M1990 Unspecified osteoarthritis, unspecified site: Secondary | ICD-10-CM | POA: Diagnosis not present

## 2023-03-24 DIAGNOSIS — M19011 Primary osteoarthritis, right shoulder: Secondary | ICD-10-CM | POA: Diagnosis not present

## 2023-03-24 DIAGNOSIS — R7401 Elevation of levels of liver transaminase levels: Secondary | ICD-10-CM | POA: Diagnosis not present

## 2023-03-24 DIAGNOSIS — B370 Candidal stomatitis: Secondary | ICD-10-CM | POA: Diagnosis not present

## 2023-03-24 DIAGNOSIS — I10 Essential (primary) hypertension: Secondary | ICD-10-CM | POA: Diagnosis not present

## 2023-03-24 DIAGNOSIS — I700 Atherosclerosis of aorta: Secondary | ICD-10-CM | POA: Diagnosis not present

## 2023-03-24 DIAGNOSIS — R0600 Dyspnea, unspecified: Secondary | ICD-10-CM | POA: Diagnosis not present

## 2023-03-24 DIAGNOSIS — E1165 Type 2 diabetes mellitus with hyperglycemia: Secondary | ICD-10-CM | POA: Diagnosis not present

## 2023-03-24 DIAGNOSIS — E782 Mixed hyperlipidemia: Secondary | ICD-10-CM | POA: Diagnosis not present

## 2023-03-24 DIAGNOSIS — J449 Chronic obstructive pulmonary disease, unspecified: Secondary | ICD-10-CM | POA: Diagnosis not present

## 2023-03-24 DIAGNOSIS — R945 Abnormal results of liver function studies: Secondary | ICD-10-CM | POA: Diagnosis not present

## 2023-03-31 ENCOUNTER — Ambulatory Visit: Payer: Medicare Other | Attending: Cardiology | Admitting: Cardiology

## 2023-03-31 ENCOUNTER — Encounter: Payer: Self-pay | Admitting: Cardiology

## 2023-03-31 VITALS — BP 138/82 | HR 80 | Ht 62.0 in | Wt 237.6 lb

## 2023-03-31 DIAGNOSIS — I10 Essential (primary) hypertension: Secondary | ICD-10-CM

## 2023-03-31 DIAGNOSIS — I700 Atherosclerosis of aorta: Secondary | ICD-10-CM

## 2023-03-31 DIAGNOSIS — Z8679 Personal history of other diseases of the circulatory system: Secondary | ICD-10-CM | POA: Diagnosis not present

## 2023-03-31 NOTE — Patient Instructions (Addendum)

## 2023-03-31 NOTE — Progress Notes (Signed)
Cardiology Office Note  Date: 03/31/2023   ID: Jessica Lewis, DOB 07-Apr-1946, MRN [id number]  History of Present Illness: Jessica Lewis is a 77 y.o. female last seen in March.  She is here today with her husband for a follow-up visit.  Since last encounter, she does not report any worsening shortness of breath beyond NYHA class I, no exertional chest pain, no palpitations or syncope.  I reviewed her medications.  She continues on Cozaar, Lopressor, and Lipitor with omega-3 supplements.  She had recent lab work with Dr. Margo Aye which I also reviewed.  ECG today shows normal sinus rhythm.  Physical Exam: VS:  BP 138/82 (BP Location: Left Arm)   Pulse 80   Ht 5\' 2"  (1.575 m)   Wt 237 lb 9.6 oz (107.8 kg)   SpO2 95%   BMI 43.46 kg/m , BMI Body mass index is 43.46 kg/m.  Wt Readings from Last 3 Encounters:  03/31/23 237 lb 9.6 oz (107.8 kg)  09/21/22 236 lb 6.4 oz (107.2 kg)  03/10/22 238 lb 12.8 oz (108.3 kg)    General: Patient appears comfortable at rest. HEENT: Conjunctiva and lids normal. Neck: Supple, no elevated JVP or carotid bruits. Lungs: Clear to auscultation, nonlabored breathing at rest. Cardiac: Regular rate and rhythm, no S3, 1/6 systolic murmur. Extremities: No pitting edema.  ECG:  An ECG dated 03/10/2022 was personally reviewed today and demonstrated:  Sinus rhythm.  Labwork:  September 2024: Hemoglobin 13.1, platelets 266, BUN 21, creatinine 0.79, potassium 5, AST 22, ALT 16, cholesterol 168, triglycerides 84, HDL 53, LDL 99, hemoglobin A1c 5.6%  Other Studies Reviewed Today:  No interval cardiac testing for review today.  Assessment and Plan:  1.  HFrecEF with history of myocarditis in the setting of COVID-19 pneumonia and subsequent normalization of LVEF, 70 to 75% range by echocardiogram in October 2022.  She remains clinically stable with plan to continue observation over time.   2.  Essential hypertension with whitecoat hypertension.  Continue Cozaar  and Lopressor.  She tracks blood pressures at home, average systolic is in the 120s-130s per review of her records.   3.  Aortic atherosclerosis by CT imaging.  Would continue on Lipitor.  Disposition:  Follow up  1 year.  Signed, Jonelle Sidle, M.D., F.A.C.C. [facility]

## 2023-04-05 DIAGNOSIS — Z23 Encounter for immunization: Secondary | ICD-10-CM | POA: Diagnosis not present

## 2023-04-06 DIAGNOSIS — M79671 Pain in right foot: Secondary | ICD-10-CM | POA: Diagnosis not present

## 2023-04-06 DIAGNOSIS — M79675 Pain in left toe(s): Secondary | ICD-10-CM | POA: Diagnosis not present

## 2023-04-06 DIAGNOSIS — L110 Acquired keratosis follicularis: Secondary | ICD-10-CM | POA: Diagnosis not present

## 2023-04-06 DIAGNOSIS — M79674 Pain in right toe(s): Secondary | ICD-10-CM | POA: Diagnosis not present

## 2023-04-06 DIAGNOSIS — M79672 Pain in left foot: Secondary | ICD-10-CM | POA: Diagnosis not present

## 2023-04-06 DIAGNOSIS — I739 Peripheral vascular disease, unspecified: Secondary | ICD-10-CM | POA: Diagnosis not present

## 2023-04-13 DIAGNOSIS — H25813 Combined forms of age-related cataract, bilateral: Secondary | ICD-10-CM | POA: Diagnosis not present

## 2023-04-27 DIAGNOSIS — H25813 Combined forms of age-related cataract, bilateral: Secondary | ICD-10-CM | POA: Diagnosis not present

## 2023-04-27 DIAGNOSIS — H01004 Unspecified blepharitis left upper eyelid: Secondary | ICD-10-CM | POA: Diagnosis not present

## 2023-04-27 DIAGNOSIS — H01002 Unspecified blepharitis right lower eyelid: Secondary | ICD-10-CM | POA: Diagnosis not present

## 2023-04-27 DIAGNOSIS — H01001 Unspecified blepharitis right upper eyelid: Secondary | ICD-10-CM | POA: Diagnosis not present

## 2023-06-15 DIAGNOSIS — M79675 Pain in left toe(s): Secondary | ICD-10-CM | POA: Diagnosis not present

## 2023-06-15 DIAGNOSIS — M79671 Pain in right foot: Secondary | ICD-10-CM | POA: Diagnosis not present

## 2023-06-15 DIAGNOSIS — M79672 Pain in left foot: Secondary | ICD-10-CM | POA: Diagnosis not present

## 2023-06-15 DIAGNOSIS — I739 Peripheral vascular disease, unspecified: Secondary | ICD-10-CM | POA: Diagnosis not present

## 2023-06-15 DIAGNOSIS — M79674 Pain in right toe(s): Secondary | ICD-10-CM | POA: Diagnosis not present

## 2023-06-15 DIAGNOSIS — L110 Acquired keratosis follicularis: Secondary | ICD-10-CM | POA: Diagnosis not present

## 2023-06-19 ENCOUNTER — Encounter (HOSPITAL_COMMUNITY)
Admission: RE | Admit: 2023-06-19 | Discharge: 2023-06-19 | Disposition: A | Discharge status: Home / Self Care | Payer: Medicare Other | Source: Ambulatory Visit | Attending: Ophthalmology | Admitting: Ophthalmology

## 2023-06-19 DIAGNOSIS — H25811 Combined forms of age-related cataract, right eye: Secondary | ICD-10-CM | POA: Diagnosis not present

## 2023-06-20 ENCOUNTER — Encounter (HOSPITAL_COMMUNITY): Payer: Self-pay

## 2023-06-22 NOTE — H&P (Signed)
Surgical History & Physical  Patient Name: Jessica Lewis  DOB: [birth year]  Surgery: Cataract extraction with intraocular lens implant phacoemulsification; Right Eye Surgeon: Fabio Pierce MD Surgery Date: 06/26/2023 Pre-Op Date: 04/27/2023  HPI: A 20 Yr. old female patient present for cataract eval per Dr. Charise Killian. 1. The patient complains of difficulty when driving, reading captions on TV, reading fine print, glare at night and during the day, which began to progress about 1 year ago. The right eye is affected. The condition's severity is worsening. This is negatively affecting the patient's quality of life and the patient is unable to function adequately in life with the current level of vision. HPI Completed by Dr. Fabio Pierce  Medical History: Cataracts  High Blood Pressure LDL Lung Problems  Review of Systems Cardiovascular High Blood Pressure Respiratory COPD All recorded systems are negative except as noted above.  Social Former smoker  Medication atorvastatin ,  meloxicam ,  metoprolol tartrate ,  Trelegy Ellipta ,  losartan   Sx/Procedures Tubal Ligation  Drug Allergies  codeine ,  Claritin ,  tetracycline   History & Physical: Heent: cataracts NECK: supple without bruits LUNGS: lungs clear to auscultation CV: regular rate and rhythm Abdomen: soft and non-tender  Impression & Plan: Assessment: 1.  COMBINED FORMS AGE RELATED CATARACT; Both Eyes (H25.813) 2.  ASTIGMATISM, REGULAR; Both Eyes (H52.223) 3.  BLEPHARITIS; Right Upper Lid, Right Lower Lid, Left Upper Lid, Left Lower Lid (H01.001, H01.002,H01.004,H01.005) 4.  DERMATOCHALASIS, no surgery; Right Upper Lid, Left Upper Lid (H02.831, H02.834) 5.  DRY EYE SYNDROME/TEAR FILM INSUFFICIENCY; Both Eyes (H04.123)  Plan: 1.  Cataract accounts for the patient's decreased vision. This visual impairment is not correctable with a tolerable change in glasses or contact lenses. Cataract surgery with an implantation of  a new lens should significantly improve the visual and functional status of the patient. Discussed all risks, benefits, alternatives, and potential complications. Discussed the procedures and recovery. Patient desires to have surgery. A-scan ordered and performed today for intra-ocular lens calculations. The surgery will be performed in order to improve vision for driving, reading, and for eye examinations. Recommend phacoemulsification with intra-ocular lens. Recommend Dextenza for post-operative pain and inflammation. Right Eye worse - first. Dilates well - shugarcaine by protocol. Declines toric.  2.  Declines toric.  3.  Blepharitis is present - recommend regular lid cleaning.  4.  Asymptomatic, recommend observation for now. Findings, prognosis and treatment options reviewed.  5.  Artificial tears 1 drop 2-3x/day.

## 2023-06-26 ENCOUNTER — Encounter (HOSPITAL_COMMUNITY)
Admission: RE | Disposition: A | Discharge status: Home / Self Care | Payer: Self-pay | Source: Home / Self Care | Attending: Ophthalmology

## 2023-06-26 ENCOUNTER — Ambulatory Visit (HOSPITAL_COMMUNITY): Payer: Medicare Other | Admitting: Anesthesiology

## 2023-06-26 ENCOUNTER — Ambulatory Visit (HOSPITAL_BASED_OUTPATIENT_CLINIC_OR_DEPARTMENT_OTHER): Payer: Medicare Other | Admitting: Anesthesiology

## 2023-06-26 ENCOUNTER — Ambulatory Visit (HOSPITAL_COMMUNITY)
Admission: RE | Admit: 2023-06-26 | Discharge: 2023-06-26 | Disposition: A | Discharge status: Home / Self Care | Payer: Medicare Other | Attending: Ophthalmology | Admitting: Ophthalmology

## 2023-06-26 DIAGNOSIS — H02831 Dermatochalasis of right upper eyelid: Secondary | ICD-10-CM | POA: Diagnosis not present

## 2023-06-26 DIAGNOSIS — H02834 Dermatochalasis of left upper eyelid: Secondary | ICD-10-CM | POA: Insufficient documentation

## 2023-06-26 DIAGNOSIS — I10 Essential (primary) hypertension: Secondary | ICD-10-CM | POA: Insufficient documentation

## 2023-06-26 DIAGNOSIS — H01001 Unspecified blepharitis right upper eyelid: Secondary | ICD-10-CM | POA: Diagnosis not present

## 2023-06-26 DIAGNOSIS — H52223 Regular astigmatism, bilateral: Secondary | ICD-10-CM | POA: Diagnosis not present

## 2023-06-26 DIAGNOSIS — Z87891 Personal history of nicotine dependence: Secondary | ICD-10-CM | POA: Insufficient documentation

## 2023-06-26 DIAGNOSIS — H01004 Unspecified blepharitis left upper eyelid: Secondary | ICD-10-CM | POA: Diagnosis not present

## 2023-06-26 DIAGNOSIS — H04123 Dry eye syndrome of bilateral lacrimal glands: Secondary | ICD-10-CM | POA: Insufficient documentation

## 2023-06-26 DIAGNOSIS — H25811 Combined forms of age-related cataract, right eye: Secondary | ICD-10-CM | POA: Insufficient documentation

## 2023-06-26 DIAGNOSIS — H01005 Unspecified blepharitis left lower eyelid: Secondary | ICD-10-CM | POA: Insufficient documentation

## 2023-06-26 DIAGNOSIS — H01002 Unspecified blepharitis right lower eyelid: Secondary | ICD-10-CM | POA: Insufficient documentation

## 2023-06-26 HISTORY — PX: CATARACT EXTRACTION W/PHACO: SHX586

## 2023-06-26 SURGERY — PHACOEMULSIFICATION, CATARACT, WITH IOL INSERTION
Anesthesia: Monitor Anesthesia Care | Site: Eye | Laterality: Right

## 2023-06-26 MED ORDER — SODIUM HYALURONATE 10 MG/ML IO SOLUTION
PREFILLED_SYRINGE | INTRAOCULAR | Status: DC | PRN
  Administered 2023-06-26: .85 mL via INTRAOCULAR

## 2023-06-26 MED ORDER — TETRACAINE HCL 0.5 % OP SOLN
1.0000 [drp] | OPHTHALMIC | Status: AC | PRN
  Administered 2023-06-26 (×3): 1 [drp] via OPHTHALMIC

## 2023-06-26 MED ORDER — LIDOCAINE HCL (PF) 1 % IJ SOLN
INTRAOCULAR | Status: DC | PRN
  Administered 2023-06-26: 1 mL via OPHTHALMIC

## 2023-06-26 MED ORDER — MIDAZOLAM HCL 2 MG/2ML IJ SOLN
INTRAMUSCULAR | Status: DC | PRN
  Administered 2023-06-26: 1 mg via INTRAVENOUS

## 2023-06-26 MED ORDER — SODIUM CHLORIDE 0.9% FLUSH
3.0000 mL | Freq: Two times a day (BID) | INTRAVENOUS | Status: DC
  Administered 2023-06-26: 5 mL via INTRAVENOUS

## 2023-06-26 MED ORDER — SODIUM CHLORIDE 0.9% FLUSH
3.0000 mL | INTRAVENOUS | Status: DC | PRN
  Administered 2023-06-26: 10 mL via INTRAVENOUS

## 2023-06-26 MED ORDER — MOXIFLOXACIN HCL 5 MG/ML IO SOLN
INTRAOCULAR | Status: DC | PRN
  Administered 2023-06-26: .3 mL via OPHTHALMIC

## 2023-06-26 MED ORDER — TROPICAMIDE 1 % OP SOLN
1.0000 [drp] | OPHTHALMIC | Status: AC | PRN
  Administered 2023-06-26 (×3): 1 [drp] via OPHTHALMIC

## 2023-06-26 MED ORDER — PHENYLEPHRINE HCL 2.5 % OP SOLN
1.0000 [drp] | OPHTHALMIC | Status: AC | PRN
  Administered 2023-06-26 (×3): 1 [drp] via OPHTHALMIC

## 2023-06-26 MED ORDER — SODIUM HYALURONATE 23MG/ML IO SOSY
PREFILLED_SYRINGE | INTRAOCULAR | Status: DC | PRN
  Administered 2023-06-26: .6 mL via INTRAOCULAR

## 2023-06-26 MED ORDER — STERILE WATER FOR IRRIGATION IR SOLN
Status: DC | PRN
  Administered 2023-06-26: 250 mL

## 2023-06-26 MED ORDER — BSS IO SOLN
INTRAOCULAR | Status: DC | PRN
  Administered 2023-06-26: 15 mL via INTRAOCULAR

## 2023-06-26 MED ORDER — POVIDONE-IODINE 5 % OP SOLN
OPHTHALMIC | Status: DC | PRN
  Administered 2023-06-26: 1 via OPHTHALMIC

## 2023-06-26 MED ORDER — MIDAZOLAM HCL 2 MG/2ML IJ SOLN
INTRAMUSCULAR | Status: AC
  Filled 2023-06-26: qty 2

## 2023-06-26 MED ORDER — LIDOCAINE HCL 3.5 % OP GEL
1.0000 | Freq: Once | OPHTHALMIC | Status: AC
  Administered 2023-06-26: 1 via OPHTHALMIC

## 2023-06-26 MED ORDER — EPINEPHRINE PF 1 MG/ML IJ SOLN
INTRAOCULAR | Status: DC | PRN
  Administered 2023-06-26: 500 mL

## 2023-06-26 SURGICAL SUPPLY — 12 items
CATARACT SUITE SIGHTPATH (MISCELLANEOUS) ×1
CLOTH BEACON ORANGE TIMEOUT ST (SAFETY) ×1 IMPLANT
EYE SHIELD UNIVERSAL CLEAR (GAUZE/BANDAGES/DRESSINGS) IMPLANT
FEE CATARACT SUITE SIGHTPATH (MISCELLANEOUS) ×1 IMPLANT
GLOVE BIOGEL PI IND STRL 7.0 (GLOVE) ×2 IMPLANT
LENS IOL TECNIS EYHANCE 22.5 (Intraocular Lens) IMPLANT
NDL HYPO 18GX1.5 BLUNT FILL (NEEDLE) ×1 IMPLANT
NEEDLE HYPO 18GX1.5 BLUNT FILL (NEEDLE) ×1
PAD ARMBOARD 7.5X6 YLW CONV (MISCELLANEOUS) ×1 IMPLANT
SYR TB 1ML LL NO SAFETY (SYRINGE) ×1 IMPLANT
TAPE SURG TRANSPORE 1 IN (GAUZE/BANDAGES/DRESSINGS) IMPLANT
WATER STERILE IRR 250ML POUR (IV SOLUTION) ×1 IMPLANT

## 2023-06-26 NOTE — Transfer of Care (Addendum)
Immediate Anesthesia Transfer of Care Note  Patient: Jessica Lewis  Procedure(s) Performed: CATARACT EXTRACTION PHACO AND INTRAOCULAR LENS PLACEMENT (IOC) (Right: Eye)  Patient Location: Short Stay  Anesthesia Type:MAC  Level of Consciousness: awake and patient cooperative  Airway & Oxygen Therapy: Patient Spontanous Breathing  Post-op Assessment: Report given to RN and Post -op Vital signs reviewed and stable  Post vital signs: Reviewed and stable  Last Vitals:  Vitals Value Taken Time  BP 154/56 06/26/23   0822  Temp 36.8 06/26/23   0822  Pulse 72 06/26/23   0822  Resp 13 06/26/23   0822  SpO2 97% 06/26/23   0822    Last Pain:  Vitals:   06/26/23 0701  TempSrc: Oral  PainSc: 0-No pain      Patients Stated Pain Goal: 5 (06/26/23 0701)  Complications: No notable events documented.

## 2023-06-26 NOTE — Interval H&P Note (Signed)
History and Physical Interval Note:  06/26/2023 7:52 AM  Jessica Lewis  has presented today for surgery, with the diagnosis of combined forms age related cataract, right eye.  The various methods of treatment have been discussed with the patient and family. After consideration of risks, benefits and other options for treatment, the patient has consented to  Procedure(s): CATARACT EXTRACTION PHACO AND INTRAOCULAR LENS PLACEMENT (IOC) (Right) as a surgical intervention.  The patient's history has been reviewed, patient examined, no change in status, stable for surgery.  I have reviewed the patient's chart and labs.  Questions were answered to the patient's satisfaction.     Fabio Pierce

## 2023-06-26 NOTE — Discharge Instructions (Addendum)
Please discharge patient when stable, will follow up today with Dr. Carnella Fryman at the [facility] office immediately following discharge.  Leave shield in place until visit.  All paperwork with discharge instructions will be given at the office.  [facility] Address:  [address]  [place], [place] [address]  

## 2023-06-26 NOTE — Anesthesia Postprocedure Evaluation (Signed)
Anesthesia Post Note  Patient: Jessica Lewis  Procedure(s) Performed: CATARACT EXTRACTION PHACO AND INTRAOCULAR LENS PLACEMENT (IOC) (Right: Eye)  Patient location during evaluation: Phase II Anesthesia Type: General Level of consciousness: awake Pain management: pain level controlled Vital Signs Assessment: post-procedure vital signs reviewed and stable Respiratory status: spontaneous breathing and respiratory function stable Cardiovascular status: blood pressure returned to baseline and stable Postop Assessment: no headache and no apparent nausea or vomiting Anesthetic complications: no Comments: Late entry   No notable events documented.   Last Vitals:  Vitals:   06/26/23 0701 06/26/23 0822  BP: (!) 163/77 (!) 154/56  Pulse: 75 73  Resp: 18 13  Temp: 36.6 C 36.8 C  SpO2: 96% 97%    Last Pain:  Vitals:   06/26/23 0822  TempSrc: Oral  PainSc: 0-No pain                 Windell Norfolk

## 2023-06-26 NOTE — Op Note (Signed)
Date of procedure: 06/26/23  Pre-operative diagnosis:  Visually significant combined form age-related cataract, Right Eye (H25.811)  Post-operative diagnosis:  Visually significant combined form age-related cataract, Right Eye (H25.811)  Procedure: Removal of cataract via phacoemulsification and insertion of intra-ocular lens Laural Benes and Johnson DIB00 +22.5D into the capsular bag of the Right Eye  Attending surgeon: Rudy Jew. Talon Witting, MD, MA  Anesthesia: MAC, Topical Akten  Complications: None  Estimated Blood Loss: <27mL (minimal)  Specimens: None  Implants: As above  Indications:  Visually significant age-related cataract, Right Eye  Procedure:  The patient was seen and identified in the pre-operative area. The operative eye was identified and dilated.  The operative eye was marked.  Topical anesthesia was administered to the operative eye.     The patient was then to the operative suite and placed in the supine position.  A timeout was performed confirming the patient, procedure to be performed, and all other relevant information.   The patient's face was prepped and draped in the usual fashion for intra-ocular surgery.  A lid speculum was placed into the operative eye and the surgical microscope moved into place and focused.  A superotemporal paracentesis was created using a 20 gauge paracentesis blade.  Shugarcaine was injected into the anterior chamber.  Viscoelastic was injected into the anterior chamber.  A temporal clear-corneal main wound incision was created using a 2.86mm microkeratome.  A continuous curvilinear capsulorrhexis was initiated using an irrigating cystitome and completed using capsulorrhexis forceps.  Hydrodissection and hydrodeliniation were performed.  Viscoelastic was injected into the anterior chamber.  A phacoemulsification handpiece and a chopper as a second instrument were used to remove the nucleus and epinucleus. The irrigation/aspiration handpiece was used to  remove any remaining cortical material.   The capsular bag was reinflated with viscoelastic, checked, and found to be intact.  The intraocular lens was inserted into the capsular bag.  The irrigation/aspiration handpiece was used to remove any remaining viscoelastic.  The clear corneal wound and paracentesis wounds were then hydrated and checked with Weck-Cels to be watertight. 0.67mL of Moxfloxacin was injected into the anterior chamber. The lid-speculum was removed.  The drape was removed.  The patient's face was cleaned with a wet and dry 4x4. A clear shield was taped over the eye. The patient was taken to the post-operative care unit in good condition, having tolerated the procedure well.  Post-Op Instructions: The patient will follow up at [facility] for a same day post-operative evaluation and will receive all other orders and instructions.

## 2023-06-26 NOTE — Anesthesia Preprocedure Evaluation (Signed)
 Anesthesia Evaluation  Patient identified by MRN, date of birth, ID band Patient awake    Reviewed: Allergy & Precautions, H&P , NPO status , Patient's Chart, lab work & pertinent test results, reviewed documented beta blocker date and time   Airway Mallampati: II  TM Distance: >3 FB Neck ROM: full    Dental no notable dental hx.    Pulmonary neg pulmonary ROS, former smoker   Pulmonary exam normal breath sounds clear to auscultation       Cardiovascular Exercise Tolerance: Good hypertension, negative cardio ROS  Rhythm:regular Rate:Normal     Neuro/Psych negative neurological ROS  negative psych ROS   GI/Hepatic negative GI ROS, Neg liver ROS,,,  Endo/Other  negative endocrine ROS    Renal/GU negative Renal ROS  negative genitourinary   Musculoskeletal   Abdominal   Peds  Hematology negative hematology ROS (+)   Anesthesia Other Findings   Reproductive/Obstetrics negative OB ROS                             Anesthesia Physical Anesthesia Plan  ASA: 2  Anesthesia Plan:    Post-op Pain Management:    Induction:   PONV Risk Score and Plan:   Airway Management Planned:   Additional Equipment:   Intra-op Plan:   Post-operative Plan:   Informed Consent: I have reviewed the patients History and Physical, chart, labs and discussed the procedure including the risks, benefits and alternatives for the proposed anesthesia with the patient or authorized representative who has indicated his/her understanding and acceptance.     Dental Advisory Given  Plan Discussed with: CRNA  Anesthesia Plan Comments:        Anesthesia Quick Evaluation

## 2023-06-30 ENCOUNTER — Encounter (HOSPITAL_COMMUNITY): Payer: Self-pay | Admitting: Ophthalmology

## 2023-06-30 DIAGNOSIS — I10 Essential (primary) hypertension: Secondary | ICD-10-CM | POA: Diagnosis not present

## 2023-06-30 DIAGNOSIS — E1165 Type 2 diabetes mellitus with hyperglycemia: Secondary | ICD-10-CM | POA: Diagnosis not present

## 2023-06-30 DIAGNOSIS — M1990 Unspecified osteoarthritis, unspecified site: Secondary | ICD-10-CM | POA: Diagnosis not present

## 2023-06-30 DIAGNOSIS — M810 Age-related osteoporosis without current pathological fracture: Secondary | ICD-10-CM | POA: Diagnosis not present

## 2023-07-03 DIAGNOSIS — H25812 Combined forms of age-related cataract, left eye: Secondary | ICD-10-CM | POA: Diagnosis not present

## 2023-07-04 ENCOUNTER — Encounter (HOSPITAL_COMMUNITY)
Admission: RE | Admit: 2023-07-04 | Discharge: 2023-07-04 | Disposition: A | Discharge status: Home / Self Care | Payer: Medicare Other | Source: Ambulatory Visit | Attending: Ophthalmology | Admitting: Ophthalmology

## 2023-07-04 ENCOUNTER — Encounter (HOSPITAL_COMMUNITY): Payer: Self-pay

## 2023-07-06 NOTE — H&P (Signed)
 Surgical History & Physical  Patient Name: Jessica Lewis  DOB: [birth year]  Surgery: Cataract extraction with intraocular lens implant phacoemulsification; Left Eye Surgeon: Lynwood Hermann MD Surgery Date: 07/07/2023 Pre-Op Date: 07/03/2023  HPI: [name] patient present for 1 week post op OD. Patient is doing well. She is seeing an improvement each day. Things are not as smoky as it was. Using Combo gtt BID OD Patient also here for blurry vision in the left eye. Difficulties driving due to glare during the day and at night, difficulties reading the captions on TV, and hazy/blurred vision OS. This is negatively affecting the patient's quality of life and the patient is unable to function adequately in life with the current level of vision. Patient would like to proceed with cataract sx OS. HPI was performed by Lynwood Hermann .  Medical History: Cataracts  High Blood Pressure LDL Lung Problems  Review of Systems Cardiovascular High Blood Pressure Respiratory COPD All recorded systems are negative except as noted above.  Social Former smoker   Medication Prednisolone-moxiflox-bromfen,  atorvastatin  ,  meloxicam ,  metoprolol  tartrate ,  Trelegy Ellipta ,  losartan    Sx/Procedures Phaco c IOL OD,  Tubal Ligation  Drug Allergies  codeine ,  Claritin ,  tetracycline   History & Physical: Heent: cataract NECK: supple without bruits LUNGS: lungs clear to auscultation CV: regular rate and rhythm Abdomen: soft and non-tender  Impression & Plan: Assessment: 1.  CATARACT EXTRACTION STATUS; Right Eye (Z98.41) 2.  COMBINED FORMS AGE RELATED CATARACT; Left Eye (H25.812)  Plan: 1.  1 week after cataract surgery. Doing well with improved vision and normal eye pressure. Call with any problems or concerns. Continue Pred-Moxi-Brom 2x/day for 3 more weeks.  2.  Cataract accounts for the patient's decreased vision. This visual impairment is not correctable with a tolerable  change in glasses or contact lenses. Cataract surgery with an implantation of a new lens should significantly improve the visual and functional status of the patient. Discussed all risks, benefits, alternatives, and potential complications. Discussed the procedures and recovery. Patient desires to have surgery. A-scan ordered and performed today for intra-ocular lens calculations. The surgery will be performed in order to improve vision for driving, reading, and for eye examinations. Recommend phacoemulsification with intra-ocular lens. Recommend Dextenza  for post-operative pain and inflammation. Left Eye. Surgery required to correct imbalance of vision. Dilates well - shugarcaine by protocol.

## 2023-07-07 ENCOUNTER — Encounter (HOSPITAL_COMMUNITY)
Admission: RE | Disposition: A | Discharge status: Home / Self Care | Payer: Self-pay | Source: Home / Self Care | Attending: Ophthalmology

## 2023-07-07 ENCOUNTER — Ambulatory Visit (HOSPITAL_COMMUNITY): Payer: Medicare Other | Admitting: Certified Registered"

## 2023-07-07 ENCOUNTER — Ambulatory Visit (HOSPITAL_COMMUNITY)
Admission: RE | Admit: 2023-07-07 | Discharge: 2023-07-07 | Disposition: A | Discharge status: Home / Self Care | Payer: Medicare Other | Attending: Ophthalmology | Admitting: Ophthalmology

## 2023-07-07 DIAGNOSIS — E6689 Other obesity not elsewhere classified: Secondary | ICD-10-CM | POA: Insufficient documentation

## 2023-07-07 DIAGNOSIS — Z9841 Cataract extraction status, right eye: Secondary | ICD-10-CM | POA: Diagnosis not present

## 2023-07-07 DIAGNOSIS — I10 Essential (primary) hypertension: Secondary | ICD-10-CM | POA: Diagnosis not present

## 2023-07-07 DIAGNOSIS — H25812 Combined forms of age-related cataract, left eye: Secondary | ICD-10-CM | POA: Diagnosis not present

## 2023-07-07 DIAGNOSIS — Z87891 Personal history of nicotine dependence: Secondary | ICD-10-CM | POA: Insufficient documentation

## 2023-07-07 DIAGNOSIS — I252 Old myocardial infarction: Secondary | ICD-10-CM | POA: Diagnosis not present

## 2023-07-07 DIAGNOSIS — J449 Chronic obstructive pulmonary disease, unspecified: Secondary | ICD-10-CM | POA: Insufficient documentation

## 2023-07-07 HISTORY — PX: CATARACT EXTRACTION W/PHACO: SHX586

## 2023-07-07 SURGERY — PHACOEMULSIFICATION, CATARACT, WITH IOL INSERTION
Anesthesia: Monitor Anesthesia Care | Site: Eye | Laterality: Left

## 2023-07-07 MED ORDER — POVIDONE-IODINE 5 % OP SOLN
OPHTHALMIC | Status: DC | PRN
  Administered 2023-07-07: 1 via OPHTHALMIC

## 2023-07-07 MED ORDER — SODIUM HYALURONATE 10 MG/ML IO SOLUTION
PREFILLED_SYRINGE | INTRAOCULAR | Status: DC | PRN
  Administered 2023-07-07: .85 mL via INTRAOCULAR

## 2023-07-07 MED ORDER — TROPICAMIDE 1 % OP SOLN
1.0000 [drp] | OPHTHALMIC | Status: AC | PRN
  Administered 2023-07-07 (×3): 1 [drp] via OPHTHALMIC

## 2023-07-07 MED ORDER — STERILE WATER FOR IRRIGATION IR SOLN
Status: DC | PRN
  Administered 2023-07-07: 1

## 2023-07-07 MED ORDER — SODIUM CHLORIDE 0.9% FLUSH: 3.0000 mL | Freq: Two times a day (BID) | INTRAVENOUS | Status: DC

## 2023-07-07 MED ORDER — MIDAZOLAM HCL 2 MG/2ML IJ SOLN
INTRAMUSCULAR | Status: DC | PRN
  Administered 2023-07-07: 2 mg via INTRAVENOUS

## 2023-07-07 MED ORDER — PHENYLEPHRINE HCL 2.5 % OP SOLN
1.0000 [drp] | OPHTHALMIC | Status: AC | PRN
Start: 2023-07-07 — End: 2023-07-07
  Administered 2023-07-07 (×3): 1 [drp] via OPHTHALMIC

## 2023-07-07 MED ORDER — MOXIFLOXACIN HCL 5 MG/ML IO SOLN
INTRAOCULAR | Status: DC | PRN
  Administered 2023-07-07: .3 mL via INTRACAMERAL

## 2023-07-07 MED ORDER — LIDOCAINE HCL 3.5 % OP GEL
1.0000 | Freq: Once | OPHTHALMIC | Status: AC
  Administered 2023-07-07: 1 via OPHTHALMIC

## 2023-07-07 MED ORDER — MIDAZOLAM HCL 2 MG/2ML IJ SOLN
INTRAMUSCULAR | Status: AC
Start: 2023-07-07 — End: ?
  Filled 2023-07-07: qty 2

## 2023-07-07 MED ORDER — BSS IO SOLN
INTRAOCULAR | Status: DC | PRN
  Administered 2023-07-07: 15 mL via INTRAOCULAR

## 2023-07-07 MED ORDER — TETRACAINE HCL 0.5 % OP SOLN
1.0000 [drp] | OPHTHALMIC | Status: AC | PRN
Start: 2023-07-07 — End: 2023-07-07
  Administered 2023-07-07 (×3): 1 [drp] via OPHTHALMIC

## 2023-07-07 MED ORDER — EPINEPHRINE PF 1 MG/ML IJ SOLN
INTRAOCULAR | Status: DC | PRN
  Administered 2023-07-07: 500 mL

## 2023-07-07 MED ORDER — SODIUM HYALURONATE 23MG/ML IO SOSY
PREFILLED_SYRINGE | INTRAOCULAR | Status: DC | PRN
  Administered 2023-07-07: .6 mL via INTRAOCULAR

## 2023-07-07 MED ORDER — LIDOCAINE HCL (PF) 1 % IJ SOLN
INTRAOCULAR | Status: DC | PRN
  Administered 2023-07-07: 1 mL via OPHTHALMIC

## 2023-07-07 MED ORDER — SODIUM CHLORIDE 0.9% FLUSH
3.0000 mL | INTRAVENOUS | Status: DC | PRN
  Administered 2023-07-07: 8 mL via INTRAVENOUS

## 2023-07-07 SURGICAL SUPPLY — 12 items
CATARACT SUITE SIGHTPATH (MISCELLANEOUS) ×1
CLOTH BEACON ORANGE TIMEOUT ST (SAFETY) ×1 IMPLANT
EYE SHIELD UNIVERSAL CLEAR (GAUZE/BANDAGES/DRESSINGS) IMPLANT
FEE CATARACT SUITE SIGHTPATH (MISCELLANEOUS) ×1 IMPLANT
GLOVE BIOGEL PI IND STRL 7.0 (GLOVE) ×2 IMPLANT
LENS IOL TECNIS EYHANCE 22.0 (Intraocular Lens) IMPLANT
NDL HYPO 18GX1.5 BLUNT FILL (NEEDLE) ×1 IMPLANT
NEEDLE HYPO 18GX1.5 BLUNT FILL (NEEDLE) ×1
PAD ARMBOARD 7.5X6 YLW CONV (MISCELLANEOUS) ×1 IMPLANT
SYR TB 1ML LL NO SAFETY (SYRINGE) ×1 IMPLANT
TAPE SURG TRANSPORE 1 IN (GAUZE/BANDAGES/DRESSINGS) IMPLANT
WATER STERILE IRR 250ML POUR (IV SOLUTION) ×1 IMPLANT

## 2023-07-07 NOTE — Op Note (Signed)
 Date of procedure: 07/07/23  Pre-operative diagnosis: Visually significant age-related combined cataract, Left Eye (H25.812)  Post-operative diagnosis: Visually significant age-related combined cataract, Left Eye (H25.812)  Procedure: Removal of cataract via phacoemulsification and insertion of intra-ocular lens Johnson and Johnson DIB00 +22.0D into the capsular bag of the Left Eye  Attending surgeon: Lynwood LABOR. Darrie Macmillan, MD, MA  Anesthesia: MAC, Topical Akten   Complications: None  Estimated Blood Loss: <39mL (minimal)  Specimens: None  Implants: As above  Indications:  Visually significant age-related cataract, Left Eye  Procedure:  The patient was seen and identified in the pre-operative area. The operative eye was identified and dilated.  The operative eye was marked.  Topical anesthesia was administered to the operative eye.     The patient was then to the operative suite and placed in the supine position.  A timeout was performed confirming the patient, procedure to be performed, and all other relevant information.   The patient's face was prepped and draped in the usual fashion for intra-ocular surgery.  A lid speculum was placed into the operative eye and the surgical microscope moved into place and focused.  An inferotemporal paracentesis was created using a 20 gauge paracentesis blade.  Shugarcaine was injected into the anterior chamber.  Viscoelastic was injected into the anterior chamber.  A temporal clear-corneal main wound incision was created using a 2.32mm microkeratome.  A continuous curvilinear capsulorrhexis was initiated using an irrigating cystitome and completed using capsulorrhexis forceps.  Hydrodissection and hydrodeliniation were performed.  Viscoelastic was injected into the anterior chamber.  A phacoemulsification handpiece and a chopper as a second instrument were used to remove the nucleus and epinucleus. The irrigation/aspiration handpiece was used to remove any  remaining cortical material.   The capsular bag was reinflated with viscoelastic, checked, and found to be intact.  The intraocular lens was inserted into the capsular bag.  The irrigation/aspiration handpiece was used to remove any remaining viscoelastic.  The clear corneal wound and paracentesis wounds were then hydrated and checked with Weck-Cels to be watertight. 0.1mL of Moxfloxacin was injected into the anterior chamber. The lid-speculum was removed.  The drape was removed.  The patient's face was cleaned with a wet and dry 4x4.    A clear shield was taped over the eye. The patient was taken to the post-operative care unit in good condition, having tolerated the procedure well.  Post-Op Instructions: The patient will follow up at [facility] for a same day post-operative evaluation and will receive all other orders and instructions.

## 2023-07-07 NOTE — Discharge Instructions (Addendum)
 Please discharge patient when stable, will follow up today with Dr. June Leap at the [facility] office immediately following discharge.  Leave shield in place until visit.  All paperwork with discharge instructions will be given at the office.  [facility] Address:  [address]  [place], [place] [address]

## 2023-07-07 NOTE — Anesthesia Preprocedure Evaluation (Signed)
 Anesthesia Evaluation  Patient identified by MRN, date of birth, ID band Patient awake    Reviewed: Allergy & Precautions, H&P , NPO status , Patient's Chart, lab work & pertinent test results, reviewed documented beta blocker date and time   History of Anesthesia Complications (+) PONV and history of anesthetic complications  Airway Mallampati: II  TM Distance: >3 FB Neck ROM: full    Dental no notable dental hx.    Pulmonary shortness of breath, pneumonia, COPD, former smoker   Pulmonary exam normal breath sounds clear to auscultation       Cardiovascular Exercise Tolerance: Good hypertension, + Past MI   Rhythm:regular Rate:Normal     Neuro/Psych  Headaches  negative psych ROS   GI/Hepatic negative GI ROS, Neg liver ROS,,,  Endo/Other    Class 4 obesity  Renal/GU negative Renal ROS  negative genitourinary   Musculoskeletal   Abdominal   Peds  Hematology negative hematology ROS (+)   Anesthesia Other Findings   Reproductive/Obstetrics negative OB ROS                              Anesthesia Physical Anesthesia Plan  ASA: 3  Anesthesia Plan:    Post-op Pain Management:    Induction:   PONV Risk Score and Plan:   Airway Management Planned:   Additional Equipment:   Intra-op Plan:   Post-operative Plan:   Informed Consent: I have reviewed the patients History and Physical, chart, labs and discussed the procedure including the risks, benefits and alternatives for the proposed anesthesia with the patient or authorized representative who has indicated his/her understanding and acceptance.     Dental Advisory Given  Plan Discussed with: CRNA  Anesthesia Plan Comments:         Anesthesia Quick Evaluation

## 2023-07-07 NOTE — Interval H&P Note (Signed)
 History and Physical Interval Note:  07/07/2023 11:33 AM  Jessica Lewis  has presented today for surgery, with the diagnosis of combined forms age related cataract, left eye.  The various methods of treatment have been discussed with the patient and family. After consideration of risks, benefits and other options for treatment, the patient has consented to  Procedure(s): CATARACT EXTRACTION PHACO AND INTRAOCULAR LENS PLACEMENT (IOC) (Left) as a surgical intervention.  The patient's history has been reviewed, patient examined, no change in status, stable for surgery.  I have reviewed the patient's chart and labs.  Questions were answered to the patient's satisfaction.     HARRIE AGENT

## 2023-07-07 NOTE — Transfer of Care (Signed)
 Immediate Anesthesia Transfer of Care Note  Patient: Jessica Lewis  Procedure(s) Performed: CATARACT EXTRACTION PHACO AND INTRAOCULAR LENS PLACEMENT (IOC) (Left: Eye)  Patient Location: Short Stay  Anesthesia Type:MAC  Level of Consciousness: awake, alert , and oriented  Airway & Oxygen  Therapy: Patient Spontanous Breathing  Post-op Assessment: Report given to RN and Post -op Vital signs reviewed and stable  Post vital signs: Reviewed and stable  Last Vitals:  Vitals Value Taken Time  BP    Temp    Pulse    Resp    SpO2      Last Pain:  Vitals:   07/07/23 1104  TempSrc: Oral  PainSc: 0-No pain      Patients Stated Pain Goal: 5 (07/07/23 1104)  Complications: No notable events documented.

## 2023-07-07 NOTE — Anesthesia Procedure Notes (Addendum)
 Procedure Name: MAC Date/Time: 07/07/2023 12:09 PM  Performed by: Eliodoro Deward FALCON, CRNAPre-anesthesia Checklist: Patient identified, Emergency Drugs available, Suction available and Patient being monitored Patient Re-evaluated:Patient Re-evaluated prior to induction Oxygen  Delivery Method: Nasal cannula Placement Confirmation: positive ETCO2

## 2023-07-10 ENCOUNTER — Encounter (HOSPITAL_COMMUNITY): Payer: Self-pay | Admitting: Ophthalmology

## 2023-07-12 DIAGNOSIS — M1990 Unspecified osteoarthritis, unspecified site: Secondary | ICD-10-CM | POA: Diagnosis not present

## 2023-07-12 DIAGNOSIS — E782 Mixed hyperlipidemia: Secondary | ICD-10-CM | POA: Diagnosis not present

## 2023-07-12 DIAGNOSIS — I700 Atherosclerosis of aorta: Secondary | ICD-10-CM | POA: Diagnosis not present

## 2023-07-12 DIAGNOSIS — I10 Essential (primary) hypertension: Secondary | ICD-10-CM | POA: Diagnosis not present

## 2023-07-12 DIAGNOSIS — R945 Abnormal results of liver function studies: Secondary | ICD-10-CM | POA: Diagnosis not present

## 2023-07-12 DIAGNOSIS — Z8673 Personal history of transient ischemic attack (TIA), and cerebral infarction without residual deficits: Secondary | ICD-10-CM | POA: Diagnosis not present

## 2023-07-12 DIAGNOSIS — J449 Chronic obstructive pulmonary disease, unspecified: Secondary | ICD-10-CM | POA: Diagnosis not present

## 2023-07-12 DIAGNOSIS — R0600 Dyspnea, unspecified: Secondary | ICD-10-CM | POA: Diagnosis not present

## 2023-07-12 DIAGNOSIS — E1165 Type 2 diabetes mellitus with hyperglycemia: Secondary | ICD-10-CM | POA: Diagnosis not present

## 2023-07-14 NOTE — Anesthesia Postprocedure Evaluation (Signed)
 Anesthesia Post Note  Patient: Jessica Lewis  Procedure(s) Performed: CATARACT EXTRACTION PHACO AND INTRAOCULAR LENS PLACEMENT (IOC) (Left: Eye)  Patient location during evaluation: Phase II Anesthesia Type: General Level of consciousness: awake Pain management: pain level controlled Vital Signs Assessment: post-procedure vital signs reviewed and stable Respiratory status: spontaneous breathing and respiratory function stable Cardiovascular status: blood pressure returned to baseline and stable Postop Assessment: no headache and no apparent nausea or vomiting Anesthetic complications: no Comments: Late entry   No notable events documented.   Last Vitals:  Vitals:   07/07/23 1104 07/07/23 1229  BP: (!) 157/89 (!) 158/78  Pulse: 70 73  Resp: 18 (!) 21  Temp: 36.6 C 36.5 C  SpO2: 97% 98%    Last Pain:  Vitals:   07/10/23 0944  TempSrc:   PainSc: 0-No pain                 Yvonna JINNY Bosworth

## 2023-08-31 DIAGNOSIS — M79672 Pain in left foot: Secondary | ICD-10-CM | POA: Diagnosis not present

## 2023-08-31 DIAGNOSIS — M79671 Pain in right foot: Secondary | ICD-10-CM | POA: Diagnosis not present

## 2023-08-31 DIAGNOSIS — L110 Acquired keratosis follicularis: Secondary | ICD-10-CM | POA: Diagnosis not present

## 2023-08-31 DIAGNOSIS — M79674 Pain in right toe(s): Secondary | ICD-10-CM | POA: Diagnosis not present

## 2023-08-31 DIAGNOSIS — M79675 Pain in left toe(s): Secondary | ICD-10-CM | POA: Diagnosis not present

## 2023-08-31 DIAGNOSIS — I739 Peripheral vascular disease, unspecified: Secondary | ICD-10-CM | POA: Diagnosis not present

## 2023-10-25 DIAGNOSIS — E1165 Type 2 diabetes mellitus with hyperglycemia: Secondary | ICD-10-CM | POA: Diagnosis not present

## 2023-10-25 DIAGNOSIS — B078 Other viral warts: Secondary | ICD-10-CM | POA: Diagnosis not present

## 2023-10-25 DIAGNOSIS — M810 Age-related osteoporosis without current pathological fracture: Secondary | ICD-10-CM | POA: Diagnosis not present

## 2023-10-25 DIAGNOSIS — I10 Essential (primary) hypertension: Secondary | ICD-10-CM | POA: Diagnosis not present

## 2023-10-25 DIAGNOSIS — M1990 Unspecified osteoarthritis, unspecified site: Secondary | ICD-10-CM | POA: Diagnosis not present

## 2023-10-25 DIAGNOSIS — X32XXXA Exposure to sunlight, initial encounter: Secondary | ICD-10-CM | POA: Diagnosis not present

## 2023-10-25 DIAGNOSIS — L570 Actinic keratosis: Secondary | ICD-10-CM | POA: Diagnosis not present

## 2023-10-31 DIAGNOSIS — R0600 Dyspnea, unspecified: Secondary | ICD-10-CM | POA: Diagnosis not present

## 2023-10-31 DIAGNOSIS — M1990 Unspecified osteoarthritis, unspecified site: Secondary | ICD-10-CM | POA: Diagnosis not present

## 2023-10-31 DIAGNOSIS — I10 Essential (primary) hypertension: Secondary | ICD-10-CM | POA: Diagnosis not present

## 2023-10-31 DIAGNOSIS — M810 Age-related osteoporosis without current pathological fracture: Secondary | ICD-10-CM | POA: Diagnosis not present

## 2023-10-31 DIAGNOSIS — E1165 Type 2 diabetes mellitus with hyperglycemia: Secondary | ICD-10-CM | POA: Diagnosis not present

## 2023-10-31 DIAGNOSIS — E782 Mixed hyperlipidemia: Secondary | ICD-10-CM | POA: Diagnosis not present

## 2023-10-31 DIAGNOSIS — I700 Atherosclerosis of aorta: Secondary | ICD-10-CM | POA: Diagnosis not present

## 2023-10-31 DIAGNOSIS — G2581 Restless legs syndrome: Secondary | ICD-10-CM | POA: Diagnosis not present

## 2023-10-31 DIAGNOSIS — R945 Abnormal results of liver function studies: Secondary | ICD-10-CM | POA: Diagnosis not present

## 2023-10-31 DIAGNOSIS — J449 Chronic obstructive pulmonary disease, unspecified: Secondary | ICD-10-CM | POA: Diagnosis not present

## 2023-11-08 DIAGNOSIS — J069 Acute upper respiratory infection, unspecified: Secondary | ICD-10-CM | POA: Diagnosis not present

## 2023-11-10 ENCOUNTER — Encounter (INDEPENDENT_AMBULATORY_CARE_PROVIDER_SITE_OTHER): Payer: Self-pay | Admitting: *Deleted

## 2023-11-21 DIAGNOSIS — M79674 Pain in right toe(s): Secondary | ICD-10-CM | POA: Diagnosis not present

## 2023-11-21 DIAGNOSIS — M79672 Pain in left foot: Secondary | ICD-10-CM | POA: Diagnosis not present

## 2023-11-21 DIAGNOSIS — L110 Acquired keratosis follicularis: Secondary | ICD-10-CM | POA: Diagnosis not present

## 2023-11-21 DIAGNOSIS — M79675 Pain in left toe(s): Secondary | ICD-10-CM | POA: Diagnosis not present

## 2023-11-21 DIAGNOSIS — I739 Peripheral vascular disease, unspecified: Secondary | ICD-10-CM | POA: Diagnosis not present

## 2023-11-21 DIAGNOSIS — M79671 Pain in right foot: Secondary | ICD-10-CM | POA: Diagnosis not present

## 2023-12-11 ENCOUNTER — Telehealth: Payer: Self-pay

## 2023-12-11 MED ORDER — PEG 3350-KCL-NA BICARB-NACL 420 G PO SOLR: 4000.0000 mL | Freq: Once | ORAL | 0 refills | Status: AC

## 2023-12-11 NOTE — Telephone Encounter (Signed)
 Who is your primary care physician: Dr.ZACK HALL  Reasons for the colonoscopy: HX COLON POLYPS  Have you had a colonoscopy before?  YES 12-18-2020 DR.CASTANEDA  Do you have family history of colon cancer? NO  Previous colonoscopy with polyps removed? YES  Do you have a history colorectal cancer?   NO  Are you diabetic? If yes, Type 1 or Type 2?    NO  Do you have a prosthetic or mechanical heart valve? NO   Do you have a pacemaker/defibrillator?   NO  Have you had endocarditis/atrial fibrillation? NO  Have you had joint replacement within the last 12 months?  NO  Do you tend to be constipated or have to use laxatives? NO  Do you have any history of drugs or alchohol?  NO  Do you use supplemental oxygen ?  NO  Have you had a stroke or heart attack within the last 6 months? NO  Do you take weight loss medication?  NO  For female patients: have you had a hysterectomy?  NO                                     are you post menopausal?       YES                                            do you still have your menstrual cycle? NO      Do you take any blood-thinning medications such as: (aspirin , warfarin, Plavix, Aggrenox)  NO  If yes we need the name, milligram, dosage and who is prescribing doctor  Current Outpatient Medications on File Prior to Visit  Medication Sig Dispense Refill   acetaminophen  (TYLENOL ) 325 MG tablet Take 650 mg by mouth every 6 (six) hours as needed.     albuterol  (VENTOLIN  HFA) 108 (90 Base) MCG/ACT inhaler Inhale 1 puff into the lungs every 6 (six) hours as needed for wheezing or shortness of breath.     Ascorbic Acid (VITAMIN C) 500 MG CHEW Chew 500 mg by mouth daily.     atorvastatin  (LIPITOR) 20 MG tablet Take 0.5 tablets (10 mg total) by mouth daily at 6 PM. 45 tablet 3   calcium  carbonate (OS-CAL - DOSED IN MG OF ELEMENTAL CALCIUM ) 1250 (500 Ca) MG tablet Take 1,000 mg by mouth daily.     cetirizine (ZYRTEC) 10 MG tablet Take 10 mg by mouth at  bedtime.     Cholecalciferol (VITAMIN D3) 50 MCG (2000 UT) TABS Take 2,000 Units by mouth daily.     Fluticasone -Umeclidin-Vilant (TRELEGY ELLIPTA) 100-62.5-25 MCG/INH AEPB Inhale 1 puff into the lungs daily.     losartan  (COZAAR ) 25 MG tablet TAKE 1 TABLET BY MOUTH ONCE DAILY. 90 tablet 1   meclizine (ANTIVERT) 25 MG tablet Take 25 mg by mouth 3 (three) times daily as needed.     meloxicam (MOBIC) 7.5 MG tablet Take 7.5 mg by mouth daily.     metoprolol  tartrate (LOPRESSOR ) 25 MG tablet TAKE 1/2 TABLET BY MOUTH TWICE DAILY. (Patient taking differently: Take 12.5 mg by mouth 2 (two) times daily.) 90 tablet 0   Multiple Vitamins-Minerals (ONE-A-DAY WOMENS PETITES PO) Take 1 tablet by mouth daily.     Omega-3 Fatty Acids (FISH OIL PO) Take 1 capsule by  mouth 2 (two) times daily.     No current facility-administered medications on file prior to visit.    Allergies  Allergen Reactions   Codeine Nausea And Vomiting   Tetracyclines & Related Rash   Claritin [Loratadine] Hives     Pharmacy: [facility]  Primary Insurance Name: [facility] [id number]  Best number where you can be reached: [phone]

## 2023-12-11 NOTE — Telephone Encounter (Signed)
 Spoke with pt. Wanted July for procedure. Scheduled for 7/16. Aware will send instructions to her and rx for prep to pharmacy

## 2023-12-11 NOTE — Telephone Encounter (Signed)
 Room 1 Thanks

## 2023-12-12 NOTE — Telephone Encounter (Signed)
 Questionnaire from recall, no referral needed

## 2024-01-04 ENCOUNTER — Other Ambulatory Visit (HOSPITAL_COMMUNITY): Payer: Self-pay | Admitting: Family Medicine

## 2024-01-04 DIAGNOSIS — Z1231 Encounter for screening mammogram for malignant neoplasm of breast: Secondary | ICD-10-CM

## 2024-01-11 DIAGNOSIS — H01004 Unspecified blepharitis left upper eyelid: Secondary | ICD-10-CM | POA: Diagnosis not present

## 2024-01-11 DIAGNOSIS — H26493 Other secondary cataract, bilateral: Secondary | ICD-10-CM | POA: Diagnosis not present

## 2024-01-11 DIAGNOSIS — H04123 Dry eye syndrome of bilateral lacrimal glands: Secondary | ICD-10-CM | POA: Diagnosis not present

## 2024-01-11 DIAGNOSIS — H01005 Unspecified blepharitis left lower eyelid: Secondary | ICD-10-CM | POA: Diagnosis not present

## 2024-01-11 DIAGNOSIS — H52223 Regular astigmatism, bilateral: Secondary | ICD-10-CM | POA: Diagnosis not present

## 2024-01-11 DIAGNOSIS — H02834 Dermatochalasis of left upper eyelid: Secondary | ICD-10-CM | POA: Diagnosis not present

## 2024-01-11 DIAGNOSIS — H02831 Dermatochalasis of right upper eyelid: Secondary | ICD-10-CM | POA: Diagnosis not present

## 2024-01-11 DIAGNOSIS — H01002 Unspecified blepharitis right lower eyelid: Secondary | ICD-10-CM | POA: Diagnosis not present

## 2024-01-11 DIAGNOSIS — H01001 Unspecified blepharitis right upper eyelid: Secondary | ICD-10-CM | POA: Diagnosis not present

## 2024-01-16 NOTE — Anesthesia Preprocedure Evaluation (Signed)
 Anesthesia Evaluation  Patient identified by MRN, date of birth, ID band Patient awake    Reviewed: Allergy & Precautions, H&P , NPO status , Patient's Chart, lab work & pertinent test results, reviewed documented beta blocker date and time   History of Anesthesia Complications (+) PONV and history of anesthetic complications  Airway Mallampati: II  TM Distance: >3 FB Neck ROM: full    Dental no notable dental hx. (+) Dental Advisory Given, Teeth Intact   Pulmonary shortness of breath, pneumonia, COPD, former smoker History of acute respiratory failure with hypoxia   Pulmonary exam normal breath sounds clear to auscultation       Cardiovascular Exercise Tolerance: Good hypertension, + Past MI  Normal cardiovascular exam Rhythm:regular Rate:Normal     Neuro/Psych  Headaches  negative psych ROS   GI/Hepatic negative GI ROS, Neg liver ROS,,,  Endo/Other    Class 4 obesity  Renal/GU negative Renal ROS  negative genitourinary   Musculoskeletal   Abdominal  (+) + obese  Peds  Hematology negative hematology ROS (+)   Anesthesia Other Findings   Reproductive/Obstetrics negative OB ROS                              Anesthesia Physical Anesthesia Plan  ASA: 3  Anesthesia Plan: General   Post-op Pain Management: Minimal or no pain anticipated   Induction: Intravenous  PONV Risk Score and Plan: Propofol  infusion  Airway Management Planned: Natural Airway and Nasal Cannula  Additional Equipment: None  Intra-op Plan:   Post-operative Plan:   Informed Consent: I have reviewed the patients History and Physical, chart, labs and discussed the procedure including the risks, benefits and alternatives for the proposed anesthesia with the patient or authorized representative who has indicated his/her understanding and acceptance.     Dental Advisory Given  Plan Discussed with:  CRNA  Anesthesia Plan Comments:         Anesthesia Quick Evaluation

## 2024-01-17 ENCOUNTER — Other Ambulatory Visit: Payer: Self-pay

## 2024-01-17 ENCOUNTER — Ambulatory Visit (HOSPITAL_BASED_OUTPATIENT_CLINIC_OR_DEPARTMENT_OTHER): Admitting: Anesthesiology

## 2024-01-17 ENCOUNTER — Encounter (HOSPITAL_COMMUNITY)
Admission: RE | Disposition: A | Discharge status: Home / Self Care | Payer: Self-pay | Source: Home / Self Care | Attending: Gastroenterology

## 2024-01-17 ENCOUNTER — Ambulatory Visit (HOSPITAL_COMMUNITY): Admitting: Anesthesiology

## 2024-01-17 ENCOUNTER — Encounter (HOSPITAL_COMMUNITY): Payer: Self-pay | Admitting: Gastroenterology

## 2024-01-17 ENCOUNTER — Ambulatory Visit (HOSPITAL_COMMUNITY)
Admission: RE | Admit: 2024-01-17 | Discharge: 2024-01-17 | Disposition: A | Discharge status: Home / Self Care | Attending: Gastroenterology | Admitting: Gastroenterology

## 2024-01-17 DIAGNOSIS — K5730 Diverticulosis of large intestine without perforation or abscess without bleeding: Secondary | ICD-10-CM | POA: Diagnosis not present

## 2024-01-17 DIAGNOSIS — Z1211 Encounter for screening for malignant neoplasm of colon: Secondary | ICD-10-CM

## 2024-01-17 DIAGNOSIS — J449 Chronic obstructive pulmonary disease, unspecified: Secondary | ICD-10-CM | POA: Diagnosis not present

## 2024-01-17 DIAGNOSIS — D124 Benign neoplasm of descending colon: Secondary | ICD-10-CM | POA: Insufficient documentation

## 2024-01-17 DIAGNOSIS — Z6841 Body Mass Index (BMI) 40.0 and over, adult: Secondary | ICD-10-CM | POA: Insufficient documentation

## 2024-01-17 DIAGNOSIS — D123 Benign neoplasm of transverse colon: Secondary | ICD-10-CM | POA: Diagnosis not present

## 2024-01-17 DIAGNOSIS — Z09 Encounter for follow-up examination after completed treatment for conditions other than malignant neoplasm: Secondary | ICD-10-CM | POA: Diagnosis present

## 2024-01-17 DIAGNOSIS — I10 Essential (primary) hypertension: Secondary | ICD-10-CM | POA: Diagnosis not present

## 2024-01-17 DIAGNOSIS — I252 Old myocardial infarction: Secondary | ICD-10-CM | POA: Diagnosis not present

## 2024-01-17 DIAGNOSIS — Z87891 Personal history of nicotine dependence: Secondary | ICD-10-CM | POA: Diagnosis not present

## 2024-01-17 DIAGNOSIS — K635 Polyp of colon: Secondary | ICD-10-CM

## 2024-01-17 DIAGNOSIS — E6689 Other obesity not elsewhere classified: Secondary | ICD-10-CM | POA: Insufficient documentation

## 2024-01-17 DIAGNOSIS — Z860100 Personal history of colon polyps, unspecified: Secondary | ICD-10-CM

## 2024-01-17 DIAGNOSIS — E66813 Obesity, class 3: Secondary | ICD-10-CM

## 2024-01-17 HISTORY — PX: COLONOSCOPY: SHX5424

## 2024-01-17 LAB — HM COLONOSCOPY

## 2024-01-17 SURGERY — COLONOSCOPY
Anesthesia: General

## 2024-01-17 MED ORDER — PHENYLEPHRINE 80 MCG/ML (10ML) SYRINGE FOR IV PUSH (FOR BLOOD PRESSURE SUPPORT)
PREFILLED_SYRINGE | INTRAVENOUS | Status: DC | PRN
  Administered 2024-01-17: 120 ug via INTRAVENOUS

## 2024-01-17 MED ORDER — PHENYLEPHRINE 80 MCG/ML (10ML) SYRINGE FOR IV PUSH (FOR BLOOD PRESSURE SUPPORT)
PREFILLED_SYRINGE | INTRAVENOUS | Status: AC
  Filled 2024-01-17: qty 10

## 2024-01-17 MED ORDER — LIDOCAINE 2% (20 MG/ML) 5 ML SYRINGE
INTRAMUSCULAR | Status: AC
  Filled 2024-01-17: qty 5

## 2024-01-17 MED ORDER — LACTATED RINGERS IV SOLN: INTRAVENOUS | Status: DC

## 2024-01-17 MED ORDER — PROPOFOL 500 MG/50ML IV EMUL
INTRAVENOUS | Status: DC | PRN
  Administered 2024-01-17: 100 mg via INTRAVENOUS
  Administered 2024-01-17: 150 ug/kg/min via INTRAVENOUS

## 2024-01-17 NOTE — Anesthesia Postprocedure Evaluation (Signed)
 Anesthesia Post Note  Patient: Jessica Lewis  Procedure(s) Performed: COLONOSCOPY  Patient location during evaluation: Endoscopy Anesthesia Type: General Level of consciousness: awake and alert Pain management: pain level controlled Vital Signs Assessment: post-procedure vital signs reviewed and stable Respiratory status: spontaneous breathing, nonlabored ventilation and respiratory function stable Cardiovascular status: blood pressure returned to baseline and stable Postop Assessment: no apparent nausea or vomiting Anesthetic complications: no   There were no known notable events for this encounter.   Last Vitals:  Vitals:   01/17/24 0711 01/17/24 0846  BP: (!) 166/60 (!) 121/51  Pulse: 98 88  Resp: 19 20  Temp: 36.6 C 36.5 C  SpO2: 93% 97%    Last Pain:  Vitals:   01/17/24 0846  TempSrc: Oral  PainSc: 0-No pain                 Eddi Hymes L Theia Dezeeuw

## 2024-01-17 NOTE — H&P (Signed)
 Jessica Lewis is an 78 y.o. female.   Chief Complaint: History of colon polyps HPI: 78 year old female with past medical history of osteoporosis, coming for history of colon polyps.  Last colonoscopy in 2022, had 3 tubular adenomas removed.  The patient denies having any complaints such as melena, hematochezia, abdominal pain or distention, change in her bowel movement consistency or frequency, no changes in weight recently.  No family history of colorectal cancer.   Past Medical History:  Diagnosis Date   Allergic rhinitis due to pollen    Bronchitis    Chronic obstructive pulmonary disease, unspecified (HCC)    Cystocele 10/14/2015   Headache(784.0)    Menstral migraines   Osteoporosis    Pneumonia due to COVID-19 virus    PONV (postoperative nausea and vomiting)    Rectocele 11/16/2012   Rosacea     Past Surgical History:  Procedure Laterality Date   CATARACT EXTRACTION W/PHACO Right 06/26/2023   Procedure: CATARACT EXTRACTION PHACO AND INTRAOCULAR LENS PLACEMENT (IOC);  Surgeon: Harrie Agent, MD;  Location: [facility];  Service: Ophthalmology;  Laterality: Right;  CDE: 16.79   CATARACT EXTRACTION W/PHACO Left 07/07/2023   Procedure: CATARACT EXTRACTION PHACO AND INTRAOCULAR LENS PLACEMENT (IOC);  Surgeon: Harrie Agent, MD;  Location: [facility];  Service: Ophthalmology;  Laterality: Left;  CDE 8.36   CHOLECYSTECTOMY     COLONOSCOPY  02/09/2011   Procedure: COLONOSCOPY;  Surgeon: Claudis RAYMOND Rivet, MD;  Location: [facility];  Service: Endoscopy;  Laterality: N/A;   COLONOSCOPY WITH PROPOFOL  N/A 12/18/2020   Procedure: COLONOSCOPY WITH PROPOFOL ;  Surgeon: Eartha Angelia Sieving, MD;  Location: [facility];  Service: Gastroenterology;  Laterality: N/A;  8:15   POLYPECTOMY  12/18/2020   Procedure: POLYPECTOMY INTESTINAL;  Surgeon: Eartha Angelia Sieving, MD;  Location: [facility];  Service: Gastroenterology;;   TUBAL LIGATION Bilateral     Family History  Problem Relation Age  of Onset   Stroke Mother    Social History:  reports that she quit smoking about 38 years ago. Her smoking use included cigarettes. She started smoking about 53 years ago. She has a 22.5 pack-year smoking history. She has never used smokeless tobacco. She reports that she does not drink alcohol and does not use drugs.  Allergies:  Allergies  Allergen Reactions   Codeine Nausea And Vomiting   Tetracyclines & Related Rash   Claritin [Loratadine] Hives    Medications Prior to Admission  Medication Sig Dispense Refill   calcium  carbonate (OS-CAL - DOSED IN MG OF ELEMENTAL CALCIUM ) 1250 (500 Ca) MG tablet Take 1,000 mg by mouth daily.     cetirizine (ZYRTEC) 10 MG tablet Take 10 mg by mouth at bedtime.     Cholecalciferol (VITAMIN D3) 50 MCG (2000 UT) TABS Take 2,000 Units by mouth daily.     Fluticasone -Umeclidin-Vilant (TRELEGY ELLIPTA) 100-62.5-25 MCG/INH AEPB Inhale 1 puff into the lungs daily.     losartan  (COZAAR ) 25 MG tablet TAKE 1 TABLET BY MOUTH ONCE DAILY. 90 tablet 1   meclizine (ANTIVERT) 25 MG tablet Take 25 mg by mouth 3 (three) times daily as needed.     meloxicam (MOBIC) 7.5 MG tablet Take 7.5 mg by mouth daily.     metoprolol  tartrate (LOPRESSOR ) 25 MG tablet TAKE 1/2 TABLET BY MOUTH TWICE DAILY. (Patient taking differently: Take 12.5 mg by mouth 2 (two) times daily.) 90 tablet 0   Multiple Vitamins-Minerals (ONE-A-DAY WOMENS PETITES PO) Take 1 tablet by mouth daily.  Omega-3 Fatty Acids (FISH OIL PO) Take 1 capsule by mouth 2 (two) times daily.     atorvastatin  (LIPITOR) 20 MG tablet Take 0.5 tablets (10 mg total) by mouth daily at 6 PM. 45 tablet 3    No results found for this or any previous visit (from the past 48 hours). No results found.  Review of Systems  All other systems reviewed and are negative.   Blood pressure (!) 166/60, pulse 98, temperature 97.8 F (36.6 C), temperature source Oral, resp. rate 19, height 5' 3 (1.6 m), weight 107 kg, SpO2  93%. Physical Exam  GENERAL: The patient is AO x3, in no acute distress. HEENT: Head is normocephalic and atraumatic. EOMI are intact. Mouth is well hydrated and without lesions. NECK: Supple. No masses LUNGS: Clear to auscultation. No presence of rhonchi/wheezing/rales. Adequate chest expansion HEART: RRR, normal s1 and s2. ABDOMEN: Soft, nontender, no guarding, no peritoneal signs, and nondistended. BS +. No masses. EXTREMITIES: Without any cyanosis, clubbing, rash, lesions or edema. NEUROLOGIC: AOx3, no focal motor deficit. SKIN: no jaundice, no rashes  Assessment/Plan 78 year old female with past medical history of osteoporosis, coming for history of colon polyps.  Will proceed with colonoscopy.  Toribio Eartha Flavors, MD 01/17/2024, 7:39 AM

## 2024-01-17 NOTE — Transfer of Care (Signed)
 Immediate Anesthesia Transfer of Care Note  Patient: Jessica Lewis  Procedure(s) Performed: COLONOSCOPY  Patient Location: Short Stay  Anesthesia Type:MAC  Level of Consciousness: awake, alert , oriented, and patient cooperative  Airway & Oxygen  Therapy: Patient Spontanous Breathing  Post-op Assessment: Report given to RN and Post -op Vital signs reviewed and stable  Post vital signs: Reviewed and stable  Last Vitals:  Vitals Value Taken Time  BP 121/51 01/17/24 08:48  Temp    Pulse 85 01/17/24 08:48  Resp 15 01/17/24 08:48  SpO2 96% on RA 01/17/24 08:48    Last Pain:  Vitals:   01/17/24 0820  TempSrc:   PainSc: 3       Patients Stated Pain Goal: 5 (01/17/24 0711)  Complications: No notable events documented.

## 2024-01-17 NOTE — Op Note (Signed)
 [facility] Patient Name: Jessica Lewis Procedure Date: 01/17/2024 8:05 AM MRN: [id number] Date of Birth: March 12, 1946 Attending MD: Toribio Fortune , , [id number] CSN: [id number] Age: 78 Admit Type: Outpatient Procedure:                Colonoscopy Indications:              Surveillance: Personal history of adenomatous                            polyps on last colonoscopy 3 years ago Providers:                Toribio Fortune, Leandrew Edelman RN, RN, [place] Wilson,                            Technician Referring MD:              Medicines:                Monitored Anesthesia Care Complications:            No immediate complications. Estimated Blood Loss:     Estimated blood loss: none. Procedure:                Pre-Anesthesia Assessment:                           - Prior to the procedure, a History and Physical                            was performed, and patient medications, allergies                            and sensitivities were reviewed. The patient's                            tolerance of previous anesthesia was reviewed.                           - The risks and benefits of the procedure and the                            sedation options and risks were discussed with the                            patient. All questions were answered and informed                            consent was obtained.                           - ASA Grade Assessment: II - A patient with mild                            systemic disease.                           After obtaining informed consent, the colonoscope  was passed under direct vision. Throughout the                            procedure, the patient's blood pressure, pulse, and                            oxygen  saturations were monitored continuously. The                            [id number]-[id number] ([id number]) scope was introduced through                            the anus and advanced to the the cecum, identified                             by appendiceal orifice and ileocecal valve. The                            colonoscopy was performed without difficulty. The                            patient tolerated the procedure well. The quality                            of the bowel preparation was good. Scope In: 8:24:13 AM Scope Out: 8:41:25 AM Scope Withdrawal Time: 0 hours 11 minutes 47 seconds  Total Procedure Duration: 0 hours 17 minutes 12 seconds  Findings:      The perianal and digital rectal examinations were normal.      Two sessile polyps were found in the transverse colon. The polyps were 1       to 4 mm in size. These polyps were removed with a cold snare. Resection       and retrieval were complete.      A 2 mm polyp was found in the descending colon. The polyp was sessile.       The polyp was removed with a cold snare. Resection and retrieval were       complete.      Scattered small-mouthed diverticula were found in the sigmoid colon and       descending colon.      The retroflexed view of the distal rectum and anal verge was normal and       showed no anal or rectal abnormalities. Impression:               - Two 1 to 4 mm polyps in the transverse colon,                            removed with a cold snare. Resected and retrieved.                           - One 2 mm polyp in the descending colon, removed                            with a cold snare. Resected and retrieved.                           -  Diverticulosis in the sigmoid colon and in the                            descending colon.                           - The distal rectum and anal verge are normal on                            retroflexion view. Moderate Sedation:      Per Anesthesia Care Recommendation:           - Discharge patient to home (ambulatory).                           - Resume previous diet.                           - Await pathology results.                           - Repeat colonoscopy for surveillance based  on                            pathology results. Procedure Code(s):        --- Professional ---                           [phone], Colonoscopy, flexible; with removal of                            tumor(s), polyp(s), or other lesion(s) by snare                            technique Diagnosis Code(s):        --- Professional ---                           Z86.010, Personal history of colonic polyps                           D12.3, Benign neoplasm of transverse colon (hepatic                            flexure or splenic flexure)                           D12.4, Benign neoplasm of descending colon                           K57.30, Diverticulosis of large intestine without                            perforation or abscess without bleeding CPT copyright 2022 [employer]. All rights reserved. The codes documented in this report are preliminary and upon coder review may  be revised to meet current compliance requirements. Toribio Fortune, MD Toribio Fortune,  01/17/2024 8:45:23  AM This report has been signed electronically. Number of Addenda: 0

## 2024-01-17 NOTE — Discharge Instructions (Addendum)
 You are being discharged to home.  Resume your previous diet.  We are waiting for your pathology results.  Your physician has recommended a repeat colonoscopy for surveillance based on pathology results.

## 2024-01-18 ENCOUNTER — Encounter (INDEPENDENT_AMBULATORY_CARE_PROVIDER_SITE_OTHER): Payer: Self-pay | Admitting: *Deleted

## 2024-01-18 ENCOUNTER — Encounter (HOSPITAL_COMMUNITY): Payer: Self-pay | Admitting: Gastroenterology

## 2024-01-19 ENCOUNTER — Ambulatory Visit (INDEPENDENT_AMBULATORY_CARE_PROVIDER_SITE_OTHER): Payer: Self-pay | Admitting: Gastroenterology

## 2024-01-19 LAB — SURGICAL PATHOLOGY

## 2024-01-26 NOTE — Progress Notes (Signed)
 5 yr TCS noted in recall Patient result letter mailed procedure note and pathology result faxed to PCP

## 2024-01-30 DIAGNOSIS — I739 Peripheral vascular disease, unspecified: Secondary | ICD-10-CM | POA: Diagnosis not present

## 2024-01-30 DIAGNOSIS — M79675 Pain in left toe(s): Secondary | ICD-10-CM | POA: Diagnosis not present

## 2024-01-30 DIAGNOSIS — M79672 Pain in left foot: Secondary | ICD-10-CM | POA: Diagnosis not present

## 2024-01-30 DIAGNOSIS — L110 Acquired keratosis follicularis: Secondary | ICD-10-CM | POA: Diagnosis not present

## 2024-01-30 DIAGNOSIS — M79671 Pain in right foot: Secondary | ICD-10-CM | POA: Diagnosis not present

## 2024-01-30 DIAGNOSIS — M79674 Pain in right toe(s): Secondary | ICD-10-CM | POA: Diagnosis not present

## 2024-02-12 ENCOUNTER — Encounter (HOSPITAL_COMMUNITY): Payer: Self-pay

## 2024-02-12 ENCOUNTER — Ambulatory Visit (HOSPITAL_COMMUNITY)
Admission: RE | Admit: 2024-02-12 | Discharge: 2024-02-12 | Disposition: A | Discharge status: Home / Self Care | Source: Ambulatory Visit | Attending: Family Medicine | Admitting: Family Medicine

## 2024-02-12 DIAGNOSIS — Z1231 Encounter for screening mammogram for malignant neoplasm of breast: Secondary | ICD-10-CM | POA: Diagnosis not present

## 2024-02-28 DIAGNOSIS — I10 Essential (primary) hypertension: Secondary | ICD-10-CM | POA: Diagnosis not present

## 2024-02-28 DIAGNOSIS — E1165 Type 2 diabetes mellitus with hyperglycemia: Secondary | ICD-10-CM | POA: Diagnosis not present

## 2024-02-28 DIAGNOSIS — M1990 Unspecified osteoarthritis, unspecified site: Secondary | ICD-10-CM | POA: Diagnosis not present

## 2024-03-05 DIAGNOSIS — E1165 Type 2 diabetes mellitus with hyperglycemia: Secondary | ICD-10-CM | POA: Diagnosis not present

## 2024-03-05 DIAGNOSIS — I700 Atherosclerosis of aorta: Secondary | ICD-10-CM | POA: Diagnosis not present

## 2024-03-05 DIAGNOSIS — J441 Chronic obstructive pulmonary disease with (acute) exacerbation: Secondary | ICD-10-CM | POA: Diagnosis not present

## 2024-03-05 DIAGNOSIS — J449 Chronic obstructive pulmonary disease, unspecified: Secondary | ICD-10-CM | POA: Diagnosis not present

## 2024-03-05 DIAGNOSIS — Z23 Encounter for immunization: Secondary | ICD-10-CM | POA: Diagnosis not present

## 2024-03-05 DIAGNOSIS — M1990 Unspecified osteoarthritis, unspecified site: Secondary | ICD-10-CM | POA: Diagnosis not present

## 2024-03-05 DIAGNOSIS — R0600 Dyspnea, unspecified: Secondary | ICD-10-CM | POA: Diagnosis not present

## 2024-03-05 DIAGNOSIS — E782 Mixed hyperlipidemia: Secondary | ICD-10-CM | POA: Diagnosis not present

## 2024-03-05 DIAGNOSIS — I10 Essential (primary) hypertension: Secondary | ICD-10-CM | POA: Diagnosis not present

## 2024-03-05 DIAGNOSIS — Z8673 Personal history of transient ischemic attack (TIA), and cerebral infarction without residual deficits: Secondary | ICD-10-CM | POA: Diagnosis not present

## 2024-04-08 ENCOUNTER — Encounter: Payer: Self-pay | Admitting: Cardiology

## 2024-04-08 ENCOUNTER — Ambulatory Visit: Attending: Cardiology | Admitting: Cardiology

## 2024-04-08 VITALS — BP 164/82 | HR 70 | Ht 63.0 in | Wt 240.2 lb

## 2024-04-08 DIAGNOSIS — I700 Atherosclerosis of aorta: Secondary | ICD-10-CM

## 2024-04-08 DIAGNOSIS — I10 Essential (primary) hypertension: Secondary | ICD-10-CM

## 2024-04-08 DIAGNOSIS — I5020 Unspecified systolic (congestive) heart failure: Secondary | ICD-10-CM | POA: Diagnosis not present

## 2024-04-08 MED ORDER — ATORVASTATIN CALCIUM 20 MG PO TABS: 20.0000 mg | ORAL_TABLET | Freq: Every day | ORAL | 3 refills | Status: AC

## 2024-04-08 NOTE — Patient Instructions (Addendum)
 Medication Instructions:  Your physician has recommended you make the following change in your medication:  Increase atorvastatin  to 20 mg daily Continue all other medications as prescribed  Labwork: none  Testing/Procedures: none  Follow-Up: Your physician recommends that you schedule a follow-up appointment in: 1 year. You will receive a reminder call in about 8-10  -+ months reminding you to schedule your appointment. If you don't receive this call, please contact our office.  Any Other Special Instructions Will Be Listed Below (If Applicable).  If you need a refill on your cardiac medications before your next appointment, please call your pharmacy.

## 2024-04-08 NOTE — Progress Notes (Signed)
    Cardiology Office Note  Date: 04/08/2024   ID: Jessica Lewis, DOB Apr 14, 1946, MRN [id number]  History of Present Illness: Jessica Lewis is a 78 y.o. female last seen in August 2024.  She is here today with her husband for a follow-up visit.  Reports no specific change in stamina, NYHA class II dyspnea, no leg swelling or obvious fluid retention.  She continues to check blood pressures at home, average systolics are in the 130s.  She has known whitecoat hypertension and reports compliance with her medications.  We discussed increasing her Lipitor to 20 mg daily.  Lipid panel in August showed LDL 99 and HDL 49.  I reviewed her ECG today which shows normal sinus rhythm.  Physical Exam: VS:  BP (!) 164/82 (BP Location: Left Arm)   Pulse 70   Ht 5' 3 (1.6 m)   Wt 240 lb 3.2 oz (109 kg)   SpO2 98%   BMI 42.55 kg/m , BMI Body mass index is 42.55 kg/m.  Wt Readings from Last 3 Encounters:  04/08/24 240 lb 3.2 oz (109 kg)  01/17/24 236 lb (107 kg)  07/04/23 237 lb (107.5 kg)    General: Patient appears comfortable at rest. HEENT: Conjunctiva and lids normal. Neck: Supple, no elevated JVP or carotid bruits. Lungs: Clear to auscultation, nonlabored breathing at rest. Cardiac: Regular rate and rhythm, no S3 or significant systolic murmur. Extremities: No pitting edema.  ECG:  An ECG dated 01/28/2023 was personally reviewed today and demonstrated:  Sinus rhythm.  Labwork:  August 2025: Hemoglobin 13.7, platelets 271, BUN 17, creatinine 0.73, potassium 5, AST 21, ALT 17, triglycerides 82, HDL 49, LDL 99, hemoglobin A1c 5.6%  Other Studies Reviewed Today:  No interval cardiac testing for review today.  Assessment and Plan:  1.  HFrecEF with history of myocarditis in the setting of COVID-19 pneumonia and subsequent normalization of LVEF, 70 to 75% range by echocardiogram in October 2022.  Remains clinically stable, NYHA class II dyspnea and no fluid retention.  ECG reviewed and  stable.  Continue observation at this time.   2.  Primary hypertension with whitecoat hypertension.  Continue to track blood pressures at home.  Currently on Cozaar  25 mg daily which could be uptitrated if systolic trends upward more consistently.  Continue Lopressor  12.5 mg twice daily.   3.  Aortic atherosclerosis by CT imaging.  Increase Lipitor to 20 mg daily.  LDL was 99 in August.  Disposition:  Follow up 1 year.  Signed, Jayson JUDITHANN Sierras, M.D., F.A.C.C. [facility]

## 2024-04-09 DIAGNOSIS — M79675 Pain in left toe(s): Secondary | ICD-10-CM | POA: Diagnosis not present

## 2024-04-09 DIAGNOSIS — I739 Peripheral vascular disease, unspecified: Secondary | ICD-10-CM | POA: Diagnosis not present

## 2024-04-09 DIAGNOSIS — M79674 Pain in right toe(s): Secondary | ICD-10-CM | POA: Diagnosis not present

## 2024-04-09 DIAGNOSIS — L110 Acquired keratosis follicularis: Secondary | ICD-10-CM | POA: Diagnosis not present

## 2024-04-09 DIAGNOSIS — M79671 Pain in right foot: Secondary | ICD-10-CM | POA: Diagnosis not present

## 2024-04-09 DIAGNOSIS — M79672 Pain in left foot: Secondary | ICD-10-CM | POA: Diagnosis not present

## 2024-04-17 ENCOUNTER — Encounter (INDEPENDENT_AMBULATORY_CARE_PROVIDER_SITE_OTHER): Payer: Self-pay | Admitting: Gastroenterology
